# Patient Record
Sex: Female | Born: 1975 | Race: White | Hispanic: No | Marital: Married | Smoking: Former smoker
Health system: Southern US, Community
[De-identification: ages and names within clinical notes are randomized; demographics above are authoritative.]

## PROBLEM LIST (undated history)

## (undated) DIAGNOSIS — M5020 Other cervical disc displacement, unspecified cervical region: Secondary | ICD-10-CM

## (undated) DIAGNOSIS — N938 Other specified abnormal uterine and vaginal bleeding: Secondary | ICD-10-CM

## (undated) DIAGNOSIS — R112 Nausea with vomiting, unspecified: Secondary | ICD-10-CM

## (undated) DIAGNOSIS — D649 Anemia, unspecified: Secondary | ICD-10-CM

## (undated) DIAGNOSIS — F32A Depression, unspecified: Secondary | ICD-10-CM

## (undated) DIAGNOSIS — Z9889 Other specified postprocedural states: Secondary | ICD-10-CM

## (undated) DIAGNOSIS — F329 Major depressive disorder, single episode, unspecified: Secondary | ICD-10-CM

## (undated) DIAGNOSIS — K5900 Constipation, unspecified: Secondary | ICD-10-CM

## (undated) DIAGNOSIS — R1013 Epigastric pain: Secondary | ICD-10-CM

## (undated) DIAGNOSIS — N840 Polyp of corpus uteri: Secondary | ICD-10-CM

## (undated) DIAGNOSIS — N390 Urinary tract infection, site not specified: Secondary | ICD-10-CM

## (undated) DIAGNOSIS — F419 Anxiety disorder, unspecified: Secondary | ICD-10-CM

## (undated) DIAGNOSIS — N63 Unspecified lump in unspecified breast: Secondary | ICD-10-CM

## (undated) DIAGNOSIS — N644 Mastodynia: Secondary | ICD-10-CM

## (undated) DIAGNOSIS — M5030 Other cervical disc degeneration, unspecified cervical region: Secondary | ICD-10-CM

## (undated) DIAGNOSIS — N920 Excessive and frequent menstruation with regular cycle: Secondary | ICD-10-CM

## (undated) HISTORY — DX: Other cervical disc displacement, unspecified cervical region: M50.20

## (undated) HISTORY — DX: Epigastric pain: R10.13

## (undated) HISTORY — DX: Polyp of corpus uteri: N84.0

## (undated) HISTORY — DX: Anemia, unspecified: D64.9

## (undated) HISTORY — DX: Excessive and frequent menstruation with regular cycle: N92.0

## (undated) HISTORY — DX: Depression, unspecified: F32.A

## (undated) HISTORY — DX: Constipation, unspecified: K59.00

## (undated) HISTORY — DX: Anxiety disorder, unspecified: F41.9

## (undated) HISTORY — PX: DILATION AND CURETTAGE OF UTERUS: SHX78

## (undated) HISTORY — DX: Major depressive disorder, single episode, unspecified: F32.9

## (undated) HISTORY — DX: Unspecified lump in unspecified breast: N63.0

## (undated) HISTORY — DX: Other cervical disc degeneration, unspecified cervical region: M50.30

## (undated) HISTORY — DX: Urinary tract infection, site not specified: N39.0

## (undated) HISTORY — DX: Other specified abnormal uterine and vaginal bleeding: N93.8

## (undated) HISTORY — PX: HYSTEROSCOPY: SHX211

## (undated) HISTORY — DX: Mastodynia: N64.4

---

## 1996-04-19 HISTORY — PX: TONSILLECTOMY: SUR1361

## 2002-04-19 HISTORY — PX: DIAGNOSTIC LAPAROSCOPY: SUR761

## 2004-04-07 ENCOUNTER — Ambulatory Visit: Payer: Self-pay | Admitting: Unknown Physician Specialty

## 2004-04-23 ENCOUNTER — Ambulatory Visit: Payer: Self-pay | Admitting: Unknown Physician Specialty

## 2006-12-26 ENCOUNTER — Encounter: Payer: Self-pay | Admitting: Maternal and Fetal Medicine

## 2006-12-28 ENCOUNTER — Inpatient Hospital Stay: Payer: Self-pay

## 2007-05-15 ENCOUNTER — Observation Stay: Payer: Self-pay

## 2007-05-16 ENCOUNTER — Observation Stay: Payer: Self-pay | Admitting: Obstetrics and Gynecology

## 2007-06-28 ENCOUNTER — Inpatient Hospital Stay: Payer: Self-pay | Admitting: Obstetrics and Gynecology

## 2011-02-16 ENCOUNTER — Ambulatory Visit: Payer: Self-pay | Admitting: Physician Assistant

## 2011-02-22 ENCOUNTER — Emergency Department: Payer: Self-pay

## 2011-02-22 ENCOUNTER — Emergency Department: Payer: Self-pay | Admitting: Emergency Medicine

## 2012-05-20 DIAGNOSIS — N840 Polyp of corpus uteri: Secondary | ICD-10-CM

## 2012-05-20 HISTORY — DX: Polyp of corpus uteri: N84.0

## 2012-05-24 ENCOUNTER — Ambulatory Visit: Payer: Self-pay | Admitting: Obstetrics and Gynecology

## 2012-05-24 LAB — CBC
HCT: 36.8 % (ref 35.0–47.0)
HGB: 12.5 g/dL (ref 12.0–16.0)
MCHC: 34 g/dL (ref 32.0–36.0)
MCV: 94 fL (ref 80–100)
Platelet: 211 10*3/uL (ref 150–440)
RBC: 3.9 10*6/uL (ref 3.80–5.20)
WBC: 5.3 10*3/uL (ref 3.6–11.0)

## 2012-05-26 ENCOUNTER — Ambulatory Visit: Payer: Self-pay | Admitting: Obstetrics and Gynecology

## 2012-05-29 LAB — PATHOLOGY REPORT

## 2013-03-28 ENCOUNTER — Ambulatory Visit: Payer: Self-pay | Admitting: General Practice

## 2013-04-19 DIAGNOSIS — Z862 Personal history of diseases of the blood and blood-forming organs and certain disorders involving the immune mechanism: Secondary | ICD-10-CM | POA: Insufficient documentation

## 2013-05-15 ENCOUNTER — Ambulatory Visit: Payer: Self-pay | Admitting: Gastroenterology

## 2013-05-15 LAB — CBC WITH DIFFERENTIAL/PLATELET
Basophil #: 0 10*3/uL (ref 0.0–0.1)
Basophil %: 0.7 %
Eosinophil #: 0.1 10*3/uL (ref 0.0–0.7)
Eosinophil %: 1.7 %
HCT: 36.4 % (ref 35.0–47.0)
HGB: 11.8 g/dL — AB (ref 12.0–16.0)
LYMPHS PCT: 33.8 %
Lymphocyte #: 1.6 10*3/uL (ref 1.0–3.6)
MCH: 29 pg (ref 26.0–34.0)
MCHC: 32.4 g/dL (ref 32.0–36.0)
MCV: 90 fL (ref 80–100)
MONO ABS: 0.3 x10 3/mm (ref 0.2–0.9)
Monocyte %: 7.2 %
NEUTROS ABS: 2.6 10*3/uL (ref 1.4–6.5)
NEUTROS PCT: 56.6 %
Platelet: 236 10*3/uL (ref 150–440)
RBC: 4.07 10*6/uL (ref 3.80–5.20)
RDW: 14.7 % — ABNORMAL HIGH (ref 11.5–14.5)
WBC: 4.6 10*3/uL (ref 3.6–11.0)

## 2013-05-15 LAB — FERRITIN: FERRITIN (ARMC): 5 ng/mL — AB (ref 8–388)

## 2013-05-16 LAB — IRON AND TIBC
IRON SATURATION: 8 %
Iron Bind.Cap.(Total): 478 ug/dL — ABNORMAL HIGH (ref 250–450)
Iron: 36 ug/dL — ABNORMAL LOW (ref 50–170)
UNBOUND IRON-BIND. CAP.: 442 ug/dL

## 2013-06-07 LAB — HM PAP SMEAR: HM PAP: NEGATIVE

## 2014-02-27 ENCOUNTER — Ambulatory Visit (INDEPENDENT_AMBULATORY_CARE_PROVIDER_SITE_OTHER): Payer: 59 | Admitting: General Surgery

## 2014-02-27 ENCOUNTER — Encounter: Payer: Self-pay | Admitting: General Surgery

## 2014-02-27 VITALS — BP 122/80 | HR 76 | Resp 12 | Ht 66.5 in | Wt 143.0 lb

## 2014-02-27 DIAGNOSIS — R1011 Right upper quadrant pain: Secondary | ICD-10-CM

## 2014-02-27 NOTE — Progress Notes (Signed)
Patient ID: Stephanie Chambers, female   DOB: 01-13-1976, 38 y.o.   MRN: [id number]  Chief Complaint  Patient presents with  . Abdominal Pain    HPI Stephanie Chambers is a 38 y.o. female.  here today for evaluation right upper abdomen pain described as a "discomfort. She describes it as a "bulge" and " fullness" feeling. She has noticed this since Nov last year. It does seem to come and go. Ultrasound was done 6-8 months ago that was negative. No trouble chewing or swallowing her food. Not associated with any particular foods. Bowels are regular 3 x a week. Denies nausea, vomiting or reflux. She has seen Dr Rayann Heman and he felt like it was IBS.  The symptoms originally started as severe diarrhea last year.  HPI  History reviewed. No pertinent past medical history.  Past Surgical History  Procedure Laterality Date  . Tonsillectomy  1998  . Diagnostic laparoscopy  2004  . Hysteroscopy      Family History  Problem Relation Age of Onset  . Cancer Mother 44    breast with liver mets  . Cancer Father 72    colon, lung, kidney    Social History History  Substance Use Topics  . Smoking status: Never Smoker   . Smokeless tobacco: Never Used  . Alcohol Use: 0.0 oz/week    0 Not specified per week    Allergies  Allergen Reactions  . Other Shortness Of Breath    omnicef    Current Outpatient Prescriptions  Medication Sig Dispense Refill  . buPROPion (WELLBUTRIN SR) 150 MG 12 hr tablet Take 150 mg by mouth daily.   6   No current facility-administered medications for this visit.    Review of Systems Review of Systems  Constitutional: Negative.   Respiratory: Negative.   Cardiovascular: Negative.   Gastrointestinal: Positive for abdominal pain. Negative for nausea, vomiting, diarrhea and constipation.    Blood pressure 122/80, pulse 76, resp. rate 12, height 5' 6.5" (1.689 m), weight 143 lb (64.864 kg), last menstrual period 02/03/2014.  Physical Exam Physical Exam   Constitutional: She is oriented to person, place, and time. She appears well-developed and well-nourished.  Eyes: Conjunctivae are normal. No scleral icterus.  Neck: Neck supple.  Cardiovascular: Normal rate, regular rhythm and normal heart sounds.   Pulmonary/Chest: Effort normal and breath sounds normal.  Abdominal: Soft. Normal appearance and bowel sounds are normal. There is no hepatosplenomegaly. There is no tenderness.  Lymphadenopathy:    She has no cervical adenopathy.  Neurological: She is alert and oriented to person, place, and time.  Skin: Skin is warm and dry.    Data Reviewed Ultrasound reviewed. No biliary abnormality Assessment    RUQ abd discomfort, episodic , no relation to eating or type of food. Possible IBS. FH of colon breast , kidney cancers.    Plan    CT scan abdomen.    Patient has been scheduled for a CT abdomen/pelvis with contrast at [facility] for 03-06-14 at 9 am (arrive 8:45 am). Prep: no solids 4 hours prior but patient may have clear liquids up until exam time, pick up prep kit, and take medication list. Patient verbalizes understanding.   PCP:  Ashok Cordia Ref Dr. Dennison Nancy 02/27/2014, 1:41 PM

## 2014-02-27 NOTE — Patient Instructions (Addendum)
The patient is aware to call back for any questions or concerns.  Patient has been scheduled for a CT abdomen/pelvis with contrast at [facility] for 03-06-14 at 9 am (arrive 8:45 am). Prep: no solids 4 hours prior but patient may have clear liquids up until exam time, pick up prep kit, and take medication list. Patient verbalizes understanding.

## 2014-03-06 ENCOUNTER — Ambulatory Visit: Payer: Self-pay | Admitting: General Surgery

## 2014-03-06 ENCOUNTER — Encounter: Payer: Self-pay | Admitting: General Surgery

## 2014-03-06 LAB — HCG, QUANTITATIVE, PREGNANCY

## 2014-03-07 ENCOUNTER — Encounter: Payer: Self-pay | Admitting: General Surgery

## 2014-06-13 ENCOUNTER — Ambulatory Visit: Payer: Self-pay | Admitting: Physician Assistant

## 2014-07-17 ENCOUNTER — Ambulatory Visit
Admit: 2014-07-17 | Disposition: A | Discharge status: Home / Self Care | Payer: Self-pay | Attending: Obstetrics and Gynecology | Admitting: Obstetrics and Gynecology

## 2014-07-17 LAB — HM MAMMOGRAPHY

## 2014-07-18 ENCOUNTER — Ambulatory Visit
Admit: 2014-07-18 | Disposition: A | Discharge status: Home / Self Care | Payer: Self-pay | Admitting: Obstetrics and Gynecology

## 2014-08-09 NOTE — Op Note (Signed)
PATIENT NAME:  Stephanie Chambers, Stephanie Chambers MR#:  [id number] DATE OF BIRTH:  04-Mar-1976  DATE OF PROCEDURE:  05/26/2012  PREOPERATIVE DIAGNOSES:  1.  Abnormal uterine bleeding.  2.  Suspected endometrial polyps.   POSTOPERATIVE DIAGNOSES: 1.  Abnormal uterine bleeding.  2.  Endometrial polyps.   OPERATIVE PROCEDURES: 1.  Hysteroscopy.  2.  Dilation and curettage of the endometrium.   SURGEON:  Alanda Slim. Juliona Vales, M.D.   FIRST ASSISTANT:  Stephanie Hook, PA student, Tyler Deis.  ANESTHESIA:  General LMA.   INDICATIONS:  The patient is a 39 year old married white female, para 1-0-0-1, with a long history of abnormal uterine bleeding; preoperative ultrasound demonstrated endometrial polyps.   FINDINGS AT SURGERY:  Revealed a gynecoid pelvis. There was uterine descensus to within 2 cm of the introitus. Hysteroscopy demonstrated several endometrial polyps within the endometrial cavity, which was otherwise normal. Bilateral ostia were seen.   DESCRIPTION OF PROCEDURE:  The patient was brought to the operating room where she was placed in the supine position. General anesthesia with LMA was induced without difficulty. She was placed in the dorsal lithotomy position using the candy cane stirrups. An exam under anesthesia demonstrated the above findings on pelvic exam. After normal prep and drape with Betadine, a weighted speculum was placed into the vagina. A single-tooth tenaculum was placed on the edge of the cervix. The uterus was sounded to 8 cm. Hanks dilators were used up to a #20-French caliber to dilate the endocervical canal. The [facility] hysteroscope with lactated Ringer's as irrigant was used to identify the intrauterine findings. Photodocumentation was taken. Stone polyp forceps was then used to extract polyps. This was followed by curettage of the endometrium with both smooth and serrated curettes. Repeat hysteroscopy was performed. Good sampling was obtained. Minimal tissue was left behind. The procedure  was then terminated with all instrumentation being removed from the vagina. The patient was then awakened, mobilized, and taken to the recovery room in satisfactory condition.   ESTIMATED BLOOD LOSS: Minimal.   COMPLICATIONS:  None.   COUNTS:  All instruments, needle and sponge counts were verified as correct.       ____________________________ Alanda Slim. Inas Avena, MD mad:dm D: 05/26/2012 08:24:25 ET T: 05/26/2012 11:03:05 ET JOB#: [id number]  cc: Hassell Done A. Marenda Accardi, MD, <Dictator> [facility] MD ELECTRONICALLY SIGNED 05/28/2012 10:47

## 2014-08-09 NOTE — H&P (Signed)
PATIENT NAME:  Chambers, Stephanie MR#:  [id number] DATE OF BIRTH:  12/27/1975  DATE OF ADMISSION:  05/26/2012  PREOPERATIVE DIAGNOSES:   1.  Abnormal uterine bleeding.  2.  Suspected endocervical polyp.   HISTORY OF PRESENT ILLNESS:  The patient is a 39 year old married white female, para 1-0-0-1, with history of abnormal uterine bleeding associated with exercise who presents for surgical management of this condition. Preoperative ultrasound on 02/09/2012 demonstrated findings consistent with probable endometrial polyp measuring 1.35 x 0.85 cm. The uterus was normal in contour and echotexture. The ovaries were seen and normal at that time. In light of the ongoing symptoms, the patient desires definitive evaluation and management.   PAST MEDICAL HISTORY:   1.  Depression.  2.  Fibrocystic change of the breasts.  3.  History of UTI.   PAST SURGICAL HISTORY:  Diagnostic laparoscopy in 1998.   PAST OB HISTORY:  Para 1-0-0-1.   FAMILY HISTORY:  Notable for hypertension and heart disease in father and paternal grandfather, colon cancer in the maternal grandmother, diabetes mellitus in grandmothers, breast cancer in mother and cousin.   SOCIAL HISTORY:  The patient does not smoke; she is a former smoker. Alcohol use is occasional. Drug use none.   CURRENT MEDICATIONS:  Wellbutrin SR 150 mg daily.   DRUG ALLERGIES:  ALMONDS AND PECANS; no known drug allergies.   REVIEW OF SYSTEMS:  The patient denies recent illness. She denies history of coagulopathy. She denies reactive airway disease.   PHYSICAL EXAMINATION: VITAL SIGNS: Height 66 inches, weight 140 pounds, blood pressure 125/72.  GENERAL: The patient is a pleasant well-appearing white female with normal affect. She is alert and oriented.  OROPHARYNX: Clear.  NECK: Supple without thyromegaly or adenopathy.  LUNGS: Clear.  HEART: Regular rate and rhythm without murmur.  ABDOMEN: Soft and nontender. No organomegaly.  PELVIC: Deferred to  the OR.  EXTREMITIES: Warm and dry. No edema.  MUSCULOSKELETAL: Normal.  NEUROLOGIC: Normal.  SKIN: Without rash.   IMPRESSION:   1.  Abnormal uterine bleeding.  2.  Endometrial polyp.   PLAN:  Hysteroscopy with dilation and curettage of the endometrium. Date of surgery is 05/26/2012.   COUNSELING NOTE:  The patient is understanding of the planned procedure and is aware of and is accepting of all surgical risks which include, but are not limited to bleeding, infection, pelvic organ injury with need for repair, uterine perforation, anesthesia risks and death. All questions are answered. Informed consent is given. The patient is ready and willing to proceed with surgery as scheduled.     ____________________________ Alanda Slim Sydelle Sherfield, MD mad:si D: 05/25/2012 20:52:05 ET T: 05/25/2012 22:04:44 ET JOB#: [id number]  cc: Hassell Done A. Lelon Ikard, MD, <Dictator> [facility] MD ELECTRONICALLY SIGNED 05/28/2012 10:47

## 2014-12-03 ENCOUNTER — Ambulatory Visit: Payer: Self-pay | Admitting: Obstetrics and Gynecology

## 2015-03-19 ENCOUNTER — Encounter: Payer: Self-pay | Admitting: Physician Assistant

## 2015-03-19 ENCOUNTER — Ambulatory Visit: Payer: Self-pay | Admitting: Physician Assistant

## 2015-03-19 VITALS — BP 108/80 | HR 72 | Temp 98.5°F

## 2015-03-19 DIAGNOSIS — J018 Other acute sinusitis: Secondary | ICD-10-CM

## 2015-03-19 MED ORDER — AZITHROMYCIN 250 MG PO TABS
ORAL_TABLET | ORAL | Status: DC
Start: 2015-03-19 — End: 2015-03-25

## 2015-03-19 MED ORDER — FLUTICASONE PROPIONATE 50 MCG/ACT NA SUSP: 2.0000 | Freq: Every day | NASAL | Status: DC

## 2015-03-19 NOTE — Progress Notes (Signed)
S: C/o runny nose and congestion for 7 days, no fever, chills, cp/sob, v/d; mucus is green and thick, cough is sporadic, c/o of facial and dental pain.   Using otc meds:   O: PE: vitals wnl, perrl eomi, normocephalic, tms dull, nasal mucosa red and swollen, throat injected, neck supple no lymph, lungs c t a, cv rrr, neuro intact  A:  Acute sinusitis   P: drink fluids, continue regular meds , use otc meds of choice, return if not improving in 5 days, return earlier if worsening . Zpack, flonase

## 2015-03-25 ENCOUNTER — Ambulatory Visit: Payer: Self-pay | Admitting: Physician Assistant

## 2015-03-25 ENCOUNTER — Encounter: Payer: Self-pay | Admitting: Physician Assistant

## 2015-03-25 VITALS — BP 100/76 | Temp 98.2°F

## 2015-03-25 DIAGNOSIS — J018 Other acute sinusitis: Secondary | ICD-10-CM

## 2015-03-25 DIAGNOSIS — H9202 Otalgia, left ear: Secondary | ICD-10-CM

## 2015-03-25 MED ORDER — AMOXICILLIN 875 MG PO TABS: 875.0000 mg | ORAL_TABLET | Freq: Two times a day (BID) | ORAL | Status: DC

## 2015-03-25 MED ORDER — PREDNISONE 10 MG PO TABS: 30.0000 mg | ORAL_TABLET | Freq: Every day | ORAL | Status: DC

## 2015-03-25 MED ORDER — FLUCONAZOLE 150 MG PO TABS: 150.0000 mg | ORAL_TABLET | Freq: Once | ORAL | Status: DC

## 2015-03-25 NOTE — Progress Notes (Signed)
S:  C/o ears popping and being stopped up,increased pain in left ear, no drainage from ears, no fever/chills, no cough or congestion, some sinus pressure, remainder ros neg, started dose pack after being seen last week, it gave her diarrhea and stomach pain after first loading dose so didn't take anymore until last night, no problem with it since last night Using otc meds without relief  O:  Vitals wnl, nad, tms dull b/l, nasal mucosa swollen, throat wnl, neck supple no lymph, lungs c t a, cv rrr, neuro intact  A: acute eustachean tube dysfunction, acute sinusitis, acute left ear pain  P: flonase, sudafed, prednisone 30mg  qd x 3d, amoxil 875mg  bid x 10d, go to ENT if ear pain worsening; return if not improving in 3 to 5 days, return earlier if worsening

## 2015-05-07 ENCOUNTER — Other Ambulatory Visit: Payer: Self-pay | Admitting: Obstetrics and Gynecology

## 2015-06-06 DIAGNOSIS — H52223 Regular astigmatism, bilateral: Secondary | ICD-10-CM | POA: Diagnosis not present

## 2015-07-14 ENCOUNTER — Encounter: Payer: Self-pay | Admitting: Physician Assistant

## 2015-07-14 ENCOUNTER — Ambulatory Visit: Payer: Self-pay | Admitting: Physician Assistant

## 2015-07-14 VITALS — BP 90/70 | HR 60 | Temp 99.0°F

## 2015-07-14 DIAGNOSIS — J069 Acute upper respiratory infection, unspecified: Secondary | ICD-10-CM

## 2015-07-14 DIAGNOSIS — R509 Fever, unspecified: Secondary | ICD-10-CM

## 2015-07-14 LAB — POCT INFLUENZA A/B
INFLUENZA B, POC: NEGATIVE
Influenza A, POC: NEGATIVE

## 2015-07-14 MED ORDER — AMOXICILLIN 875 MG PO TABS: 875.0000 mg | ORAL_TABLET | Freq: Two times a day (BID) | ORAL | Status: DC

## 2015-07-14 NOTE — Progress Notes (Signed)
S: C/o runny nose and congestion with dry cough for 3 weeks, + fever, chills since last night, denies cp/sob, v/d; mucus was green this am but clear throughout the day, cough is sporadic,   Using otc meds: robitussin  O: PE: vitals w low grade temp, nad,  perrl eomi, normocephalic, tms dull, nasal mucosa red and swollen, throat injected, neck supple no lymph, lungs c t a, cv rrr, neuro intact, flu swab neg  A:  Acute sinusitis, uri   P: amoxil 875mg  bid x 10d, drink fluids, continue regular meds , use otc meds of choice, return if not improving in 5 days, return earlier if worsening

## 2015-08-29 ENCOUNTER — Telehealth: Payer: Self-pay | Admitting: Obstetrics and Gynecology

## 2015-08-29 MED ORDER — BUPROPION HCL ER (SR) 150 MG PO TB12: 150.0000 mg | ORAL_TABLET | Freq: Two times a day (BID) | ORAL | Status: DC

## 2015-08-29 NOTE — Telephone Encounter (Signed)
Pt aware med erx. 

## 2015-08-29 NOTE — Telephone Encounter (Signed)
Wellbutrin refill ([place])  Pt is scheduled for AE on 6/13 and she will run out of wellbutrin before that visit

## 2015-09-30 ENCOUNTER — Encounter: Payer: Self-pay | Admitting: Obstetrics and Gynecology

## 2015-10-08 ENCOUNTER — Ambulatory Visit: Payer: Self-pay | Admitting: Physician Assistant

## 2015-10-10 ENCOUNTER — Ambulatory Visit
Admission: RE | Admit: 2015-10-10 | Discharge: 2015-10-10 | Disposition: A | Discharge status: Home / Self Care | Payer: 59 | Source: Ambulatory Visit | Attending: Physician Assistant | Admitting: Physician Assistant

## 2015-10-10 ENCOUNTER — Encounter: Payer: Self-pay | Admitting: Physician Assistant

## 2015-10-10 ENCOUNTER — Ambulatory Visit: Payer: Self-pay | Admitting: Physician Assistant

## 2015-10-10 VITALS — BP 120/80 | HR 80 | Temp 98.4°F

## 2015-10-10 DIAGNOSIS — M542 Cervicalgia: Secondary | ICD-10-CM | POA: Diagnosis not present

## 2015-10-10 DIAGNOSIS — M25511 Pain in right shoulder: Secondary | ICD-10-CM

## 2015-10-10 NOTE — Progress Notes (Signed)
   Subjective:neck and right shoulder pain    Patient ID: Stephanie Chambers, female    DOB: 1976-03-26, 40 y.o.   MRN: [id number]  HPI Patient c/o 3 years of neck and right shoulder pain. Follow by Chiropractor for same length of time. Sessions only provided transit relief. States intermitting by increasing numbness to upper extremities.  No other palliative measures. States no imaging perform for this chronic compliant.  Review of Systems Negative except for compliant.    Objective:   Physical Exam No spinal or shoulder deformity. Spinal process non-tender to palpation. Patine has moderate guarding at [facility] joint. Full and equal ROM of neck and shoulder today.       Assessment & Plan:Radicular neck pain to right upper extremity.  Continue supportive care pending imaging of C-Spine and Right Shoulder.  Follow s/p x-ray.

## 2015-10-14 ENCOUNTER — Ambulatory Visit (INDEPENDENT_AMBULATORY_CARE_PROVIDER_SITE_OTHER): Payer: 59 | Admitting: Obstetrics and Gynecology

## 2015-10-14 ENCOUNTER — Encounter: Payer: Self-pay | Admitting: Obstetrics and Gynecology

## 2015-10-14 VITALS — BP 97/63 | HR 77 | Ht 66.5 in | Wt 145.6 lb

## 2015-10-14 DIAGNOSIS — Z0000 Encounter for general adult medical examination without abnormal findings: Secondary | ICD-10-CM

## 2015-10-14 DIAGNOSIS — Z1239 Encounter for other screening for malignant neoplasm of breast: Secondary | ICD-10-CM | POA: Diagnosis not present

## 2015-10-14 DIAGNOSIS — N943 Premenstrual tension syndrome: Secondary | ICD-10-CM | POA: Diagnosis not present

## 2015-10-14 DIAGNOSIS — R51 Headache: Secondary | ICD-10-CM | POA: Diagnosis not present

## 2015-10-14 DIAGNOSIS — R519 Headache, unspecified: Secondary | ICD-10-CM

## 2015-10-14 DIAGNOSIS — Z01419 Encounter for gynecological examination (general) (routine) without abnormal findings: Secondary | ICD-10-CM

## 2015-10-14 MED ORDER — BUPROPION HCL ER (SR) 150 MG PO TB12: 150.0000 mg | ORAL_TABLET | Freq: Two times a day (BID) | ORAL | Status: DC

## 2015-10-14 NOTE — Patient Instructions (Signed)
1. No Pap smear is needed until next year 2. Mammogram ordered 3. Continue with healthy eating and exercise 4. Refill Wellbutrin 5. Return in 1 year or as needed

## 2015-10-14 NOTE — Progress Notes (Signed)
Patient ID: Stephanie Chambers, female   DOB: [birth year], 40 y.o.   MRN: [id number] ANNUAL PREVENTATIVE CARE GYN  ENCOUNTER NOTE  Subjective:       Stephanie Chambers is a 40 y.o. G1P1 female here for a routine annual gynecologic exam.  Current complaints: 1.  Pms-1-2 weeks before cycle - h/a blurred vision fatigue breast tenderness   Headaches responded to [place]. Bowel and bladder function are normal. Father and maternal grandmother had colon cancer, with father's diagnosis being made at age 24   Gynecologic History Patient's last menstrual period was 09/15/2015. Contraception: none Last Pap: 05/2013 neg/neg . Results were: normal Last mammogram: 07/17/2014. Results were: normal  Obstetric History OB History  Gravida Para Term Preterm AB SAB TAB Ectopic Multiple Living  1 1            # Outcome Date GA Lbr Len/2nd Weight Sex Delivery Anes PTL Lv  1 Para             Obstetric Comments  1st Menstrual Cycle:  11  1st Pregnancy:  31    Past Medical History  Diagnosis Date  . Lump in female breast   . UTI (lower urinary tract infection)   . Depression     major  . Menorrhagia   . Epigastric abdominal pain   . Anemia   . Family history of breast cancer in mother   . Mastodynia   . Anxiety   . DUB (dysfunctional uterine bleeding)   . Constipation   . Endometrial polyp 05/2012    hysteroscopy w/ d &C    Past Surgical History  Procedure Laterality Date  . Tonsillectomy  1998  . Diagnostic laparoscopy  2004  . Hysteroscopy    . Dilation and curettage of uterus      Current Outpatient Prescriptions on File Prior to Visit  Medication Sig Dispense Refill  . buPROPion (WELLBUTRIN SR) 150 MG 12 hr tablet Take 1 tablet (150 mg total) by mouth 2 (two) times daily. 60 tablet 2  . fluticasone (FLONASE) 50 MCG/ACT nasal spray Place 2 sprays into both nostrils daily. 16 g 6   No current facility-administered medications on file prior to visit.    Allergies  Allergen Reactions   . Other Shortness Of Breath    omnicef    Social History   Social History  . Marital Status: Married    Spouse Name: N/A  . Number of Children: N/A  . Years of Education: N/A   Occupational History  . Not on file.   Social History Main Topics  . Smoking status: Former Research scientist (life sciences)  . Smokeless tobacco: Never Used  . Alcohol Use: 0.0 oz/week    0 Standard drinks or equivalent per week     Comment: occas  . Drug Use: No  . Sexual Activity: Yes    Birth Control/ Protection: None   Other Topics Concern  . Not on file   Social History Narrative    Family History  Problem Relation Age of Onset  . Cancer Mother 24    breast with liver mets  . Cancer Father 50    colon, lung, kidney  . Heart disease Father   . Depression Maternal Grandmother   . Cancer Maternal Grandmother     colon  . Depression Paternal Grandmother   . Heart disease Paternal Grandfather     The following portions of the patient's history were reviewed and updated as appropriate: allergies, current medications, past family history,  past medical history, past social history, past surgical history and problem list.  Review of Systems ROS Review of Systems - General ROS: negative for - chills, fatigue, fever, hot flashes, night sweats, weight gain or weight loss Psychological ROS: negative for - anxiety, decreased libido, depression, mood swings, physical abuse or sexual abuse Ophthalmic ROS: negative for - blurry vision, eye pain or loss of vision ENT ROS: negative for - headaches, hearing change, visual changes or vocal changes Allergy and Immunology ROS: negative for - hives, itchy/watery eyes or seasonal allergies Hematological and Lymphatic ROS: negative for - bleeding problems, bruising, swollen lymph nodes or weight loss Endocrine ROS: negative for - galactorrhea, hair pattern changes, hot flashes, malaise/lethargy, mood swings, palpitations, polydipsia/polyuria, skin changes, temperature intolerance  or unexpected weight changes Breast ROS: negative for - new or changing breast lumps or nipple discharge Respiratory ROS: negative for - cough or shortness of breath Cardiovascular ROS: negative for - chest pain, irregular heartbeat, palpitations or shortness of breath Gastrointestinal ROS: no abdominal pain, change in bowel habits, or black or bloody stools Genito-Urinary ROS: no dysuria, trouble voiding, or hematuria Musculoskeletal ROS: negative for - joint pain or joint stiffness Neurological ROS: negative for - bowel and bladder control changes Dermatological ROS: negative for rash and skin lesion changes   Objective:   BP 97/63 mmHg  Pulse 77  Ht 5' 6.5" (1.689 m)  Wt 145 lb 9.6 oz (66.044 kg)  BMI 23.15 kg/m2  LMP 09/15/2015 CONSTITUTIONAL: Well-developed, well-nourished female in no acute distress.  PSYCHIATRIC: Normal mood and affect. Normal behavior. Normal judgment and thought content. [place]: Alert and oriented to person, place, and time. Normal muscle tone coordination. No cranial nerve deficit noted. HENT:  Normocephalic, atraumatic, External right and left ear normal. Oropharynx is clear and moist EYES: Conjunctivae and EOM are normal.  No scleral icterus.  NECK: Normal range of motion, supple, no masses.  Normal thyroid.  SKIN: Skin is warm and dry. No rash noted. Not diaphoretic. No erythema. No pallor. CARDIOVASCULAR: Normal heart rate noted, regular rhythm, no murmur. RESPIRATORY: Clear to auscultation bilaterally. Effort and breath sounds normal, no problems with respiration noted. BREASTS: Symmetric in size. No masses, skin changes, nipple drainage, or lymphadenopathy. ABDOMEN: Soft, normal bowel sounds, no distention noted.  No tenderness, rebound or guarding.  BLADDER: Normal PELVIC:  External Genitalia: Normal  BUS: Normal  Vagina: Normal  Cervix: Normal; No lesions  Uterus: Normal; midplane, normal size and shape, mobile  Adnexa: Normal  RV: External  Exam NormaI, No Rectal Masses and Normal Sphincter tone  MUSCULOSKELETAL: Normal range of motion. No tenderness.  No cyanosis, clubbing, or edema.  2+ distal pulses. LYMPHATIC: No Axillary, Supraclavicular, or Inguinal Adenopathy.    Assessment:   Annual gynecologic examination 40 y.o. Contraception: none Normal BMI PMS  Plan:  Pap: Not needed Mammogram: Ordered Stool Guaiac Testing:  Not Indicated Labs: decline Routine preventative health maintenance measures emphasized: Exercise/Diet/Weight control, Tobacco Warnings, Alcohol/Substance use risks, Stress Management and Safe Sex Refill Wellbutrin Return to [place], CMA  Brayton Mars, MD  Note: This dictation was prepared with Dragon dictation along with smaller phrase technology. Any transcriptional errors that result from this process are unintentional.

## 2015-11-11 ENCOUNTER — Ambulatory Visit: Payer: 59 | Attending: Physician Assistant | Admitting: Physical Therapy

## 2015-11-11 DIAGNOSIS — M542 Cervicalgia: Secondary | ICD-10-CM | POA: Insufficient documentation

## 2015-11-11 NOTE — Therapy (Signed)
[place] MAIN [facility] SERVICES [address] [place], [place], [address] Phone: [phone]   Fax:  [phone]  Patient Details  Name: Stephanie Chambers MRN: [id number] Date of Birth: 11-03-1975 Referring Provider:  Versie Starks, PA-C  Encounter Date: 11/11/2015 PT/OT/SLP Screening Form   Time: in_08:05__     Time out_08:30__   Complaint __Neck and Shoulder pain_________________ Past Medical Hx:  _None Significant_______________ Injury Date:___Neck has been hurting for last 6-7 years off/on________  Pain Scale: _Current: 2-3/10, Worst: 4/10, Best: 1/10;l Reports constant pain with no relief; Pain is worse in the morning after sleeping; Better after massage or chiropractor_________ Patient's phone number: [phone]  Hx (this occurrence):  Patient reports having upper trap pain for last 6-7 years off/on. She reports having to see a massage therapist or chiropractor every 3-4 months for pain relief. She reports worst pain in the morning after sleeping. She has been unable to sleep on her right side. She reports trying a new pillow (contour memory foam) which has helped a little but not much;  Assessment: Negative spurlings for impingement; negative ULNTTs bilaterally; Patient does exhibit tightness in right upper trap which is worse with left cervical lateral flexion; Slight pain with right lateral flexion; Demonstrates mild forward rounded shoulders in unsupported sitting; Has moderate tenderness to right upper trap; Some tightness noted as compared to left;  BUE gross strength is WNL;   Recommendations:   Patient would benefit from skilled PT intervention for education in cervical stretches, posture/body mechanics and manual therapy to reduce discomfort; Educated patient in scapular retraction with cues to avoid shoulder elevation and upper trap stretch 10 sec hold x2-3;  Comments:    [x]  Patient would benefit from an MD referral []   Patient would benefit from a full PT/OT/ SLP evaluation and treatment. []  No intervention recommended at this time.  Thank you for this referral.   Trotter,Margaret  PT, DPT 11/11/2015, 8:29 AM  [place] MAIN [facility] SERVICES [place], [place], [address] Phone: [phone]   Fax:  [phone]

## 2015-11-12 NOTE — Progress Notes (Signed)
PT did free screening and suggests regular PT, approved and order sent due to chronic neck/shoulder pain

## 2015-11-12 NOTE — Addendum Note (Signed)
Addended by: Versie Starks on: 11/12/2015 11:01 AM   Modules accepted: Orders

## 2015-12-03 ENCOUNTER — Ambulatory Visit: Payer: 59 | Attending: Physician Assistant

## 2015-12-03 DIAGNOSIS — M542 Cervicalgia: Secondary | ICD-10-CM | POA: Diagnosis not present

## 2015-12-03 NOTE — Patient Instructions (Signed)
Neck: Retraction    Sit with back and head straight. Pull chin back to line up ear with shoulder. Do not turn or tilt head. May provide overpressure with hand on chin if needed. Hold _3-5___ seconds. Repeat _10___ times. Do ____ sessions per day. While at work try to perform every 45 minutes.  CAUTION: Movement should be gentle, steady and slow.  [place] Stretch, Sitting    Sit, right hand tucked under R hip, L hand over top of head. Gently retract your chin and then flex your left ear to the left shoulder. Use your right hand to pull head to side. Hold _30__ seconds. For more stretch, lean body in direction of head pull. Repeat _3-5__ times per session. Do _2__ sessions per day.   (Clinic) Extension: Thoracic With Lumbar Lock - Sitting    Sit with back against bolster, knees bent, hands locked behind head. Breathe in, extending head and trunk over bolster. Breathe out. Hold position for 3-5 seconds. Repeat _10___ times per set. Do __2__ sets per session. Perform 2 times/day.

## 2015-12-05 NOTE — Therapy (Signed)
[place] MAIN [facility] SERVICES [address] [place], [place], [address] Phone: [phone]   Fax:  [phone]  Physical Therapy Evaluation  Patient Details  Name: Stephanie Chambers MRN: [id number] Date of Birth: [birth year] Referring Provider: Ashok Cordia  Encounter Date: 12/03/2015      PT End of Session - 12/05/15 1332    Visit Number 1   Number of Visits 13   Date for PT Re-Evaluation 01/14/16   PT Start Time 0800   PT Stop Time 0900   PT Time Calculation (min) 60 min   Activity Tolerance Patient tolerated treatment well   Behavior During Therapy [facility] for tasks assessed/performed      Past Medical History:  Diagnosis Date  . Anemia   . Anxiety   . Constipation   . Depression    major  . DUB (dysfunctional uterine bleeding)   . Endometrial polyp 05/2012   hysteroscopy w/ d &C  . Epigastric abdominal pain   . Family history of breast cancer in mother   . Lump in female breast   . Mastodynia   . Menorrhagia   . UTI (lower urinary tract infection)     Past Surgical History:  Procedure Laterality Date  . DIAGNOSTIC LAPAROSCOPY  2004  . DILATION AND CURETTAGE OF UTERUS    . HYSTEROSCOPY    . TONSILLECTOMY  1998    There were no vitals filed for this visit.           [facility] PT Assessment - 12/05/15 1310      Assessment   Medical Diagnosis Cervicalgia   Referring Provider Ashok Cordia   Onset Date/Surgical Date 08/18/15  Approximate   Next MD Visit Not reported   Prior Therapy No physical therapy. History of chiropractic care and massage therapy     Precautions   Precautions None     Restrictions   Weight Bearing Restrictions No     Balance Screen   Has the patient fallen in the past 6 months No     [place] residence   Living Arrangements Spouse/significant other;Children   Available Help at Discharge Family   Type of [place]     Prior Function   Level of  [place] Full time employment   Vocation Requirements Works in administration at [employer]   Overall Cognitive Status Within Functional Limits for tasks assessed     Observation/Other Assessments   Other Surveys  Other Surveys   Neck Disability Index  16%     Sensation   Additional Comments Intact to light touch throughout. Pt reports intermittent RUE numbness tingling. Does not appear to follow a typical dermatomal pattern but pt reports from hand to shoulder.      Posture/Postural Control   Posture Comments Forward head posture with rounded shoulders. Poor sitting posture     ROM / Strength   AROM / PROM / Strength AROM;Strength     AROM   Overall AROM Comments Pain with flexion, extension. Stretch with L lat flexion, pain with R lat flexion.. Negative spurlings R, positive L. Positive Hoffman bilaterally. 2+ BR, BJ, KJ relfexes, Absent AJ bilaterally. Negative babinski bilaterally. No change with repeated protraction or retraction. Normal mobility with 1st rib mobilizations but positive reproduction of symptoms with N/T radiating into RUE. Cervical and thoracic CPA grossly normal in moblity but with positive reproduction of pain and radiating  symptoms T1/T2 central and R UPA mobilizations. ULTT A (median bias) positive reproduction of pain with L lateral neck flexion and decrease with right lateral neck flexion   AROM Assessment Site Cervical   Cervical Flexion 50   Cervical Extension 50   Cervical - Right Side Bend 38   Cervical - Left Side Bend 30   Cervical - Right Rotation 80   Cervical - Left Rotation 80     Strength   Overall Strength Comments Grossly 4+ to 5/5 throughout UE. No focal weakness identified. Grip strength testing defered. LE strength grossly WFL     Palpation   Palpation comment Pt denies tenderness to palpation along posterior neck, upper/mid trap, and rhomboids. No notable tenderness noted     Special Tests     Special Tests Cervical   Cervical Tests Spurling's;Dictraction     Spurling's   Findings Negative   Side Right     Distraction Test   Findngs Positive   side Right   Comment Decrease in symptoms     Ambulation/Gait   Gait Comments Independent with all transfers and mobility          TREATMENT  MANUAL THERAPY  C6-T2 CPA and R UPA, 30 seconds/bout x 2 bouts/level; Pt instructed in self thoracic mobilizations, repeated cervical retractions, and scalene stretch;                 PT Education - 12/05/15 1320    Education provided Yes   Education Details HEP, plan of care   Person(s) Educated Patient   Methods Explanation   Comprehension Verbalized understanding             PT Long Term Goals - 12/05/15 1327      PT LONG TERM GOAL #1   Title Pt will be independent with HEP in order to decrease symptoms and improve pain-free function at home and work   Time 6   Period Weeks   Status New     PT LONG TERM GOAL #2   Title Pt will score less than 10% on NDI in order to demonstrate significant reducation in disability related to neck pain   Baseline 12/03/15: 16%   Time 6   Period Weeks   Status New     PT LONG TERM GOAL #3   Title Pt will report worst neck pain as less than or equal to 3/10 on NPRS in order to demonstrate singificant improvement in neck pain   Baseline 12/03/15: 6/10   Time 6   Period Weeks   Status New               Plan - 12/05/15 1320    Clinical Impression Statement Pt is a pleasant 40 yo female referred for cervicalgia. PT examination reveals R posterior neck pain with N/T in RUE. Sensation to light touch in RUE appears to be intact and symmetrical. Reproduction of pain with active cervical flexion, extension, and R lateral flexion. Pt with positive reproduction of symptoms with upper thoracic central spinal mobility testing as well as R unilateral mobility testing. Negative spurling's test but positive distraction and  positive ULTT with median nerve bias. Pt also with positive reproduction of pain with first rib mobilizations. Pt provided exercises for home program as well as education regarding posture while at work. Pt will benefit from skilled PT services to address deficits in neck pain in order to participate in pain-free activities at home and work.     Rehab  Potential Excellent   Clinical Impairments Affecting Rehab Potential Positive: motivation, prior improvement; Negative: chronicity   PT Frequency 2x / week   PT Duration 6 weeks   PT Treatment/Interventions ADLs/Self Care Home Management;Aquatic Therapy;Cryotherapy;Electrical Stimulation;Iontophoresis 4mg /ml Dexamethasone;Moist Heat;Traction;Ultrasound;Therapeutic exercise;Neuromuscular re-education;Patient/family education;Manual techniques;Passive range of motion;Dry needling   PT Next Visit Plan Central and unilateral lower cervical/upper thoracic mobilizations, first rib mobilizations, median nerve glides, scalene stretch   PT Home Exercise Plan Scalene stretch, repeated retractions, thoracic extension mobility   Consulted and Agree with Plan of Care Patient      Patient will benefit from skilled therapeutic intervention in order to improve the following deficits and impairments:  Pain  Visit Diagnosis: Cervicalgia - Plan: PT plan of care cert/re-cert     Problem List Patient Active Problem List   Diagnosis Date Noted  . Cephalalgia 10/14/2015  . PMS (premenstrual syndrome) 10/14/2015   Phillips Grout PT, DPT   Huprich,Jason 12/05/2015, 1:37 PM  [place] MAIN [facility] SERVICES [address] [place], [place], [address] Phone: [phone]   Fax:  [phone]  Name: Stephanie Chambers MRN: [id number] Date of Birth: [birth year]

## 2015-12-08 ENCOUNTER — Ambulatory Visit: Payer: 59 | Admitting: Physical Therapy

## 2015-12-08 ENCOUNTER — Encounter: Payer: Self-pay | Admitting: Physical Therapy

## 2015-12-08 DIAGNOSIS — M542 Cervicalgia: Secondary | ICD-10-CM
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
## 2015-12-08 NOTE — Patient Instructions (Addendum)
Neurovascular: Median Nerve Glide With Cervical Bias - Supine    Lie with neck supported, right arm out to side, elbow straight, thumb down, fingers and wrist bent back. Slowly move opposite side ear toward shoulder as far as possible without pain. Repeat _10___ times per set. Do ___3_ sets per session. Do _5___ sessions per week.  Copyright  VHI. All rights reserved.

## 2015-12-11 ENCOUNTER — Ambulatory Visit: Payer: 59

## 2015-12-11 DIAGNOSIS — M542 Cervicalgia: Secondary | ICD-10-CM
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
## 2015-12-16 ENCOUNTER — Ambulatory Visit: Payer: 59 | Admitting: Physical Therapy

## 2015-12-18 ENCOUNTER — Encounter: Payer: Self-pay | Admitting: Physical Therapy

## 2015-12-18 ENCOUNTER — Ambulatory Visit: Payer: 59 | Admitting: Physical Therapy

## 2015-12-18 DIAGNOSIS — M542 Cervicalgia: Secondary | ICD-10-CM

## 2015-12-18 NOTE — Therapy (Signed)
[address] MAIN [facility] SERVICES [address] [place], [place], [address] Phone: [phone]   Fax:  [phone]  Physical Therapy Treatment  Patient Details  Name: Stephanie Chambers MRN: [id number] Date of Birth: [birth year] Referring Provider: Ashok Cordia  Encounter Date: 12/18/2015      PT End of Session - 12/18/15 0928    Visit Number 4   Number of Visits 13   Date for PT Re-Evaluation 01/14/16   PT Start Time 0830   PT Stop Time 0920   PT Time Calculation (min) 50 min   Activity Tolerance Patient tolerated treatment well   Behavior During Therapy [facility] for tasks assessed/performed      Past Medical History:  Diagnosis Date  . Anemia   . Anxiety   . Constipation   . Depression    major  . DUB (dysfunctional uterine bleeding)   . Endometrial polyp 05/2012   hysteroscopy w/ d &C  . Epigastric abdominal pain   . Family history of breast cancer in mother   . Lump in female breast   . Mastodynia   . Menorrhagia   . UTI (lower urinary tract infection)     Past Surgical History:  Procedure Laterality Date  . DIAGNOSTIC LAPAROSCOPY  2004  . DILATION AND CURETTAGE OF UTERUS    . HYSTEROSCOPY    . TONSILLECTOMY  1998    There were no vitals filed for this visit.      Subjective Assessment - 12/18/15 0836    Subjective Patient continues to have neck pain and right UE pain if she moves her head. She has tingling and numbness that goes to her right fingers with head movement.       Cervical traction with hold 25 lbs for 20 seconds and then rest force of 20 lbs for 10 seconds  x 20 minutes   Therapeutic exercise:  Repeated cervical extension x 10 Repeated cervical SBR x 10  Repeated cervical lateral rotation x 10   Patient has initial numbness and tingling and pain in her cervical area and then after repeated extensions the pain and the numbness decreases. She is going to perform her HEP of repeated extension and repeated RSB  every hour to see if her pain decreases.                              PT Education - 12/18/15 0903    Education provided Yes   Education Details HEP   Person(s) Educated Patient   Methods Explanation   Comprehension Verbalized understanding             PT Long Term Goals - 12/05/15 1327      PT LONG TERM GOAL #1   Title Pt will be independent with HEP in order to decrease symptoms and improve pain-free function at home and work   Time 6   Period Weeks   Status New     PT LONG TERM GOAL #2   Title Pt will score less than 10% on NDI in order to demonstrate significant reducation in disability related to neck pain   Baseline 12/03/15: 16%   Time 6   Period Weeks   Status New     PT LONG TERM GOAL #3   Title Pt will report worst neck pain as less than or equal to 3/10 on NPRS in order to demonstrate singificant improvement in neck pain  Baseline 12/03/15: 6/10   Time 6   Period Weeks   Status New               Plan - 12/18/15 0929    Clinical Impression Statement Patient reports neck pain that is intermittent and worse in the AM and improves as the day progresses. She has numbness and tingling in her RUE down to her fingers that is intermittent. She responds to traction  moderately and repsonds to repeated right SB and repeated extension.    Rehab Potential Excellent   Clinical Impairments Affecting Rehab Potential Positive: motivation, prior improvement; Negative: chronicity   PT Frequency 2x / week   PT Duration 6 weeks   PT Treatment/Interventions ADLs/Self Care Home Management;Aquatic Therapy;Cryotherapy;Electrical Stimulation;Iontophoresis 4mg /ml Dexamethasone;Moist Heat;Traction;Ultrasound;Therapeutic exercise;Neuromuscular re-education;Patient/family education;Manual techniques;Passive range of motion;Dry needling   PT Next Visit Plan Central and unilateral lower cervical/upper thoracic mobilizations, first rib mobilizations, median  nerve glides, scalene stretch, STM   PT Home Exercise Plan Scalene stretch, repeated cervical retractions, thoracic extension mobility, cervical retractions seated, median nerve glides   Consulted and Agree with Plan of Care Patient      Patient will benefit from skilled therapeutic intervention in order to improve the following deficits and impairments:  Pain  Visit Diagnosis: Cervicalgia     Problem List Patient Active Problem List   Diagnosis Date Noted  . Cephalalgia 10/14/2015  . PMS (premenstrual syndrome) 10/14/2015   Alanson Puls, PT, DPT [place], Minette [place]land S 12/18/2015, 10:05 AM  [place] MAIN [facility] SERVICES [address] [place], [place], [address] Phone: [phone]   Fax:  [phone]  Name: Stephanie Chambers MRN: [id number] Date of Birth: 17-Oct-1975

## 2015-12-24 ENCOUNTER — Other Ambulatory Visit: Payer: Self-pay | Admitting: Unknown Physician Specialty

## 2015-12-24 ENCOUNTER — Ambulatory Visit: Payer: 59 | Attending: Physician Assistant | Admitting: Physical Therapy

## 2015-12-24 VITALS — BP 101/25 | HR 64

## 2015-12-24 DIAGNOSIS — M542 Cervicalgia: Secondary | ICD-10-CM | POA: Insufficient documentation

## 2015-12-24 DIAGNOSIS — J019 Acute sinusitis, unspecified: Secondary | ICD-10-CM | POA: Diagnosis not present

## 2015-12-24 DIAGNOSIS — M6281 Muscle weakness (generalized): Secondary | ICD-10-CM | POA: Insufficient documentation

## 2015-12-24 DIAGNOSIS — H93A1 Pulsatile tinnitus, right ear: Secondary | ICD-10-CM | POA: Diagnosis not present

## 2015-12-24 DIAGNOSIS — M792 Neuralgia and neuritis, unspecified: Secondary | ICD-10-CM | POA: Diagnosis not present
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
## 2015-12-26 ENCOUNTER — Ambulatory Visit: Payer: 59

## 2015-12-29 ENCOUNTER — Ambulatory Visit: Payer: 59

## 2015-12-31 ENCOUNTER — Ambulatory Visit: Payer: 59 | Admitting: Physical Therapy

## 2016-01-02 ENCOUNTER — Ambulatory Visit
Admission: RE | Admit: 2016-01-02 | Discharge: 2016-01-02 | Disposition: A | Discharge status: Home / Self Care | Payer: 59 | Source: Ambulatory Visit | Attending: Unknown Physician Specialty | Admitting: Unknown Physician Specialty

## 2016-01-02 DIAGNOSIS — H93A9 Pulsatile tinnitus, unspecified ear: Secondary | ICD-10-CM | POA: Diagnosis not present

## 2016-01-02 DIAGNOSIS — M50223 Other cervical disc displacement at C6-C7 level: Secondary | ICD-10-CM | POA: Diagnosis not present

## 2016-01-02 DIAGNOSIS — M792 Neuralgia and neuritis, unspecified: Secondary | ICD-10-CM | POA: Diagnosis not present

## 2016-01-02 DIAGNOSIS — H93A1 Pulsatile tinnitus, right ear: Secondary | ICD-10-CM | POA: Diagnosis not present

## 2016-01-02 DIAGNOSIS — M50222 Other cervical disc displacement at C5-C6 level: Secondary | ICD-10-CM | POA: Diagnosis not present

## 2016-01-02 MED ORDER — GADOBENATE DIMEGLUMINE 529 MG/ML IV SOLN
10.0000 mL | Freq: Once | INTRAVENOUS | Status: AC | PRN
  Administered 2016-01-02: 10 mL via INTRAVENOUS

## 2016-01-05 ENCOUNTER — Ambulatory Visit: Payer: 59 | Admitting: Physical Therapy

## 2016-01-06 DIAGNOSIS — J309 Allergic rhinitis, unspecified: Secondary | ICD-10-CM | POA: Diagnosis not present

## 2016-01-06 DIAGNOSIS — M47812 Spondylosis without myelopathy or radiculopathy, cervical region: Secondary | ICD-10-CM | POA: Diagnosis not present

## 2016-01-06 DIAGNOSIS — H93A9 Pulsatile tinnitus, unspecified ear: Secondary | ICD-10-CM | POA: Diagnosis not present

## 2016-01-07 ENCOUNTER — Ambulatory Visit: Payer: 59

## 2016-01-13 ENCOUNTER — Ambulatory Visit: Payer: 59

## 2016-01-13 DIAGNOSIS — M542 Cervicalgia: Secondary | ICD-10-CM | POA: Diagnosis not present

## 2016-01-13 DIAGNOSIS — M6281 Muscle weakness (generalized): Secondary | ICD-10-CM
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
## 2016-01-15 ENCOUNTER — Ambulatory Visit: Payer: 59 | Admitting: Physical Therapy

## 2016-01-15 DIAGNOSIS — M542 Cervicalgia: Secondary | ICD-10-CM

## 2016-01-15 DIAGNOSIS — M6281 Muscle weakness (generalized): Secondary | ICD-10-CM | POA: Diagnosis not present
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
## 2016-01-23 ENCOUNTER — Ambulatory Visit: Payer: 59

## 2016-01-27 ENCOUNTER — Ambulatory Visit: Payer: 59 | Admitting: Physical Therapy

## 2016-01-30 ENCOUNTER — Ambulatory Visit: Payer: 59 | Attending: Physician Assistant

## 2016-01-30 DIAGNOSIS — M542 Cervicalgia: Secondary | ICD-10-CM | POA: Diagnosis not present

## 2016-01-30 DIAGNOSIS — M6281 Muscle weakness (generalized): Secondary | ICD-10-CM | POA: Insufficient documentation
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
## 2016-02-03 ENCOUNTER — Ambulatory Visit: Payer: 59 | Admitting: Physical Therapy

## 2016-02-03 ENCOUNTER — Encounter: Payer: Self-pay | Admitting: Physical Therapy

## 2016-02-03 DIAGNOSIS — M542 Cervicalgia: Secondary | ICD-10-CM

## 2016-02-03 DIAGNOSIS — M6281 Muscle weakness (generalized): Secondary | ICD-10-CM
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
## 2016-02-11 ENCOUNTER — Telehealth: Payer: Self-pay | Admitting: Obstetrics and Gynecology

## 2016-02-11 NOTE — Telephone Encounter (Signed)
Stephanie Chambers has had right breast pain for 6wks. It started up under her arm and now around the 3 o'colock position. She has a mammogram on 11/14, should that be changed to a diagnostic bc of the pain, she said no lumps or bumps. Please call to inform what she needs to do.

## 2016-02-11 NOTE — Telephone Encounter (Signed)
Advised pt mad will want to see her before ordering a dx mammo. Appt made for 02/12/16 at 1pm.

## 2016-02-12 ENCOUNTER — Ambulatory Visit: Payer: 59 | Admitting: Physical Therapy

## 2016-02-12 ENCOUNTER — Ambulatory Visit (INDEPENDENT_AMBULATORY_CARE_PROVIDER_SITE_OTHER): Payer: 59 | Admitting: Obstetrics and Gynecology

## 2016-02-12 ENCOUNTER — Encounter: Payer: Self-pay | Admitting: Obstetrics and Gynecology

## 2016-02-12 VITALS — BP 112/78 | HR 74 | Ht 66.5 in | Wt 141.0 lb

## 2016-02-12 DIAGNOSIS — Z803 Family history of malignant neoplasm of breast: Secondary | ICD-10-CM

## 2016-02-12 DIAGNOSIS — N644 Mastodynia: Secondary | ICD-10-CM | POA: Diagnosis not present

## 2016-02-12 NOTE — Progress Notes (Signed)
GYN ENCOUNTER NOTE  Subjective:       Stephanie Chambers is a 40 y.o. G1P1 female is here for gynecologic evaluation of the following issues:  1. Right breast pain for the past 5-6 weeks. Pain began in the right axilla, then began radiating through the entire breast and is not localized to the medial lower quadrant. No masses, lumps or inflammation felt. Denies any redness or nipple discharge. Pain began 2 weeks before he menstrual cycle began and has persisted since. Cyclic breast pain the past but it has never felt like this or persisted this long. FmHx includes mother diagnosed with breast cancer at age 28.  Family history of colon cancer in father as well as renal cancer in father   Gynecologic History Patient's last menstrual period was 01/26/2016 (exact date). Last mammogram: 30/30/2016. Results were: normal  Obstetric History OB History  Gravida Para Term Preterm AB Living  1 1          SAB TAB Ectopic Multiple Live Births               # Outcome Date GA Lbr Len/2nd Weight Sex Delivery Anes PTL Lv  1 Para             Obstetric Comments  1st Menstrual Cycle:  11  1st Pregnancy:  31    Past Medical History:  Diagnosis Date  . Anemia   . Anxiety   . Bulging of cervical intervertebral disc   . Constipation   . Depression    major  . DUB (dysfunctional uterine bleeding)   . Endometrial polyp 05/2012   hysteroscopy w/ d &C  . Epigastric abdominal pain   . Family history of breast cancer in mother   . Lump in female breast   . Mastodynia   . Menorrhagia   . UTI (lower urinary tract infection)     Past Surgical History:  Procedure Laterality Date  . DIAGNOSTIC LAPAROSCOPY  2004  . DILATION AND CURETTAGE OF UTERUS    . HYSTEROSCOPY    . TONSILLECTOMY  1998    Current Outpatient Prescriptions on File Prior to Visit  Medication Sig Dispense Refill  . buPROPion (WELLBUTRIN SR) 150 MG 12 hr tablet Take 1 tablet (150 mg total) by mouth 2 (two) times daily. 120 tablet 6    No current facility-administered medications on file prior to visit.     Allergies  Allergen Reactions  . Other Shortness Of Breath    omnicef    Social History   Social History  . Marital status: Married    Spouse name: N/A  . Number of children: N/A  . Years of education: N/A   Occupational History  . Not on file.   Social History Main Topics  . Smoking status: Former Research scientist (life sciences)  . Smokeless tobacco: Never Used  . Alcohol use 0.0 oz/week     Comment: occas  . Drug use: No  . Sexual activity: Yes    Birth control/ protection: None   Other Topics Concern  . Not on file   Social History Narrative  . No narrative on file    Family History  Problem Relation Age of Onset  . Cancer Mother 2    breast with liver mets  . Cancer Father 21    colon, lung, kidney  . Heart disease Father   . Depression Maternal Grandmother   . Cancer Maternal Grandmother     colon  . Depression Paternal Grandmother   .  Heart disease Paternal Grandfather     The following portions of the patient's history were reviewed and updated as appropriate: allergies, current medications, past family history, past medical history, past social history, past surgical history and problem list.  Review of Systems Review of Systems - Per history of present illness  Objective:   BP 112/78   Pulse 74   Ht 5' 6.5" (1.689 m)   Wt 141 lb (64 kg)   LMP 01/26/2016 (Exact Date)   BMI 22.42 kg/m  CONSTITUTIONAL: Well-developed, well-nourished female in no acute distress.  HENT:  Normocephalic, atraumatic.  NECK: Normal range of motion, supple, no masses.  Normal thyroid.  SKIN: Skin is warm and dry. No rash noted. Not diaphoretic. No erythema. No pallor. [place]: Alert and oriented to person, place, and time. PSYCHIATRIC: Normal mood and affect. Normal behavior. Normal judgment and thought content. CARDIOVASCULAR:Not Examined RESPIRATORY: Not Examined BREASTS: bilaterally symmetric without mass,  adenopathy or nipple discharge ABDOMEN: Soft, non distended; Non tender.  No Organomegaly.     Assessment:   Right breast pain- breast exam was completely benign, last mammogram(3D) was over a year ago-BI-RADS 1 (06/20/14)- MM ordered for November 14th, 2017   Plan:   MM Digital Screening Bilateral- November 14th, 2017 Consider genetic testing  Return for annual exam as scheduled  A total of 15 minutes were spent face-to-face with the patient during this encounter and over half of that time dealt with counseling and coordination of care.    Alla German, PA-S Brayton Mars, MD   I have seen, interviewed, and examined the patient in conjunction with the [facility].A. student and affirm the diagnosis and management plan. Wadie Mattie A. Marysa Wessner, MD, FACOG   Note: This dictation was prepared with Dragon dictation along with smaller phrase technology. Any transcriptional errors that result from this process are unintentional.

## 2016-02-12 NOTE — Patient Instructions (Signed)
1. 3-D mammogram ordered 2. Return for annual exam as scheduled

## 2016-02-17 ENCOUNTER — Encounter: Payer: Self-pay | Admitting: Physical Therapy

## 2016-02-26 ENCOUNTER — Ambulatory Visit: Payer: 59 | Attending: Physician Assistant | Admitting: Physical Therapy

## 2016-03-02 ENCOUNTER — Ambulatory Visit
Admission: RE | Admit: 2016-03-02 | Discharge: 2016-03-02 | Disposition: A | Discharge status: Home / Self Care | Payer: 59 | Source: Ambulatory Visit | Attending: Obstetrics and Gynecology | Admitting: Obstetrics and Gynecology

## 2016-03-02 DIAGNOSIS — Z1239 Encounter for other screening for malignant neoplasm of breast: Secondary | ICD-10-CM

## 2016-03-02 DIAGNOSIS — Z1231 Encounter for screening mammogram for malignant neoplasm of breast: Secondary | ICD-10-CM | POA: Diagnosis not present

## 2016-04-16 ENCOUNTER — Encounter: Payer: Self-pay | Admitting: Certified Nurse Midwife

## 2016-04-16 ENCOUNTER — Telehealth: Payer: Self-pay | Admitting: Obstetrics and Gynecology

## 2016-04-16 ENCOUNTER — Ambulatory Visit (INDEPENDENT_AMBULATORY_CARE_PROVIDER_SITE_OTHER): Payer: 59 | Admitting: Certified Nurse Midwife

## 2016-04-16 VITALS — BP 99/62 | HR 77 | Temp 99.0°F | Wt 142.0 lb

## 2016-04-16 DIAGNOSIS — R51 Headache: Secondary | ICD-10-CM

## 2016-04-16 DIAGNOSIS — R52 Pain, unspecified: Secondary | ICD-10-CM | POA: Diagnosis not present

## 2016-04-16 DIAGNOSIS — R300 Dysuria: Secondary | ICD-10-CM | POA: Diagnosis not present

## 2016-04-16 DIAGNOSIS — R6883 Chills (without fever): Secondary | ICD-10-CM | POA: Diagnosis not present

## 2016-04-16 DIAGNOSIS — R519 Headache, unspecified: Secondary | ICD-10-CM

## 2016-04-16 LAB — POCT URINALYSIS DIPSTICK
Bilirubin, UA: NEGATIVE
Blood, UA: NEGATIVE
Glucose, UA: NEGATIVE
LEUKOCYTES UA: NEGATIVE
NITRITE UA: NEGATIVE
PH UA: 5
PROTEIN UA: NEGATIVE
Spec Grav, UA: >=1.03
UROBILINOGEN UA: NEGATIVE

## 2016-04-16 LAB — POCT INFLUENZA A/B
INFLUENZA A, POC: NEGATIVE
Influenza B, POC: NEGATIVE

## 2016-04-16 LAB — POCT RAPID STREP A (OFFICE): RAPID STREP A SCREEN: NEGATIVE

## 2016-04-16 MED ORDER — PHENAZOPYRIDINE HCL 200 MG PO TABS: 200.0000 mg | ORAL_TABLET | Freq: Three times a day (TID) | ORAL | 1 refills | Status: DC | PRN

## 2016-04-16 MED ORDER — NITROFURANTOIN MONOHYD MACRO 100 MG PO CAPS: 100.0000 mg | ORAL_CAPSULE | Freq: Two times a day (BID) | ORAL | 0 refills | Status: AC

## 2016-04-16 NOTE — Telephone Encounter (Signed)
Pt called and stated she thinks she has a UTI, she hadnt had one in so long, she stated she isnt sure if it could be the flu too, she isn't running a fever but she is having body aches. Pt wanted to be seen, but wasn't sure if she needed to be seen for flu or UTI urine drop off.

## 2016-04-16 NOTE — Progress Notes (Signed)
SUBJECTIVE: Stephanie Chambers is a 40 y.o. female who complains of urinary urgency and dysuria x 1 day, with chills, body aches, and headaches x 3 days.   No fever, hematuria, vaginal bleeding or urinary frequency.   Hx: Chronic UTIs, but pt states, she " hasn't had one in years".   Pt questions if these symptoms are UTI related or flu related.   OBJECTIVE:   Blood pressure 99/62, pulse 77, temperature 99 F (37.2 C), weight 142 lb (64.4 kg), last menstrual period 04/05/2016.  Appears well, in no apparent distress.    No CVA tenderness  Urine dipstick shows negative for all components.   Urine culture: pending results  Rapid flu: negative  Rapid strep: negative  ASSESSMENT:  Dysuria  PLAN:   1. Macrobid and Pyridium per orders.  2. Advised pt that I would call or MyChart with results of urine culture.   3. Call or return to clinic prn if these symptoms worsen or fail to improve as anticipated.  Diona Fanti, CNM

## 2016-04-16 NOTE — Patient Instructions (Signed)
Urinary Tract Infection, Adult Introduction A urinary tract infection (UTI) is an infection of any part of the urinary tract. The urinary tract includes the:  Kidneys.  Ureters.  Bladder.  Urethra. These organs make, store, and get rid of pee (urine) in the body. Follow these instructions at home:  Take over-the-counter and prescription medicines only as told by your doctor.  If you were prescribed an antibiotic medicine, take it as told by your doctor. Do not stop taking the antibiotic even if you start to feel better.  Avoid the following drinks:  Alcohol.  Caffeine.  Tea.  Carbonated drinks.  Drink enough fluid to keep your pee clear or pale yellow.  Keep all follow-up visits as told by your doctor. This is important.  Make sure to:  Empty your bladder often and completely. Do not to hold pee for long periods of time.  Empty your bladder before and after sex.  Wipe from front to back after a bowel movement if you are female. Use each tissue one time when you wipe. Contact a doctor if:  You have back pain.  You have a fever.  You feel sick to your stomach (nauseous).  You throw up (vomit).  Your symptoms do not get better after 3 days.  Your symptoms go away and then come back. Get help right away if:  You have very bad back pain.  You have very bad lower belly (abdominal) pain.  You are throwing up and cannot keep down any medicines or water. This information is not intended to replace advice given to you by your health care provider. Make sure you discuss any questions you have with your health care provider. Document Released: 09/22/2007 Document Revised: 09/11/2015 Document Reviewed: 02/24/2015  2017 Elsevier  

## 2016-04-16 NOTE — Progress Notes (Signed)
Pt is here with c/o poss UTI. Is c/o headache, body aches and chills. Denies fever. C/o urgency, frequency, bloating and burning.

## 2016-04-16 NOTE — Telephone Encounter (Signed)
Pt has had a h/a x 2d. Body aches and chills. No fever that she is aware of. Having pain, burning, urgency, and frequency with urination. NO cough or congestion. Appt made with JML at 1:30 today.

## 2016-04-18 LAB — URINE CULTURE

## 2016-04-21 ENCOUNTER — Telehealth: Payer: Self-pay

## 2016-04-21 ENCOUNTER — Ambulatory Visit: Payer: Self-pay | Admitting: Physician Assistant

## 2016-04-21 ENCOUNTER — Encounter: Payer: Self-pay | Admitting: Physician Assistant

## 2016-04-21 VITALS — BP 126/57 | HR 82 | Temp 99.1°F

## 2016-04-21 DIAGNOSIS — R1031 Right lower quadrant pain: Secondary | ICD-10-CM

## 2016-04-21 DIAGNOSIS — N390 Urinary tract infection, site not specified: Secondary | ICD-10-CM

## 2016-04-21 LAB — POCT URINE PREGNANCY: Preg Test, Ur: NEGATIVE

## 2016-04-21 LAB — POCT URINALYSIS DIPSTICK
BILIRUBIN UA: NEGATIVE
GLUCOSE UA: NEGATIVE
KETONES UA: NEGATIVE
NITRITE UA: NEGATIVE
Protein, UA: NEGATIVE
RBC UA: NEGATIVE
Spec Grav, UA: 1.025
Urobilinogen, UA: 0.2
pH, UA: 6

## 2016-04-21 MED ORDER — CIPROFLOXACIN HCL 500 MG PO TABS: 500.0000 mg | ORAL_TABLET | Freq: Two times a day (BID) | ORAL | 0 refills | Status: DC

## 2016-04-21 NOTE — Progress Notes (Signed)
S: c/o low grade fever for about a week, some nausea, pelvic pain, headache and achy, no cough or congestion, no vag discharge, was seen at [facility] and urine was negative, was given Macrobid until culture results came in, flu and strep swabs were negative, no v/, did have a little diarrhea but not really bad  O: vitals wnl, nad, ENT with swollen nasal mucosa , red b/l, throat wnl, neck supple no lymph, lungs c t a, cv rrr, abd soft a little tender in rlq, bs normal, ua with trace leuks, urine preg negative  A: uti, rlq pain  P: cipro 500mg  bid, stop macrobid, if rlq pain increases pt is to go to ER, return if not improved by Monday, return earlier if worsening

## 2016-04-21 NOTE — Telephone Encounter (Signed)
Attempted to contact pt-no answer and unable to leave message

## 2016-04-21 NOTE — Addendum Note (Signed)
Addended by: Versie Starks on: 04/21/2016 04:51 PM   Modules accepted: Orders

## 2016-04-22 NOTE — Telephone Encounter (Addendum)
Spoke with pt to inform her of neg urine culture and to stop antibiotic per ML. Pt states she went to walk in clinic for UTI symptoms and was given a script for Cipro. Pt states " since my urine specimen was mixed up with someone elses I shouldn't be charged". Please advise. 11:40 left message for pt to call us back with information on who told her her urine culture got "mixed up with someone elses".

## 2016-04-22 NOTE — Telephone Encounter (Signed)
Spoke with pt- at last visit patient left a urine specimen but her name was not on container. There were 2 specimens in door. This writer  assumed the one that was tested was hers. Pt is requesting she not be charged for this.

## 2016-04-22 NOTE — Telephone Encounter (Deleted)
04/22/16 11:40 left message for pt to call us back regarding information on who told pt her urine culture got "mixed up".

## 2016-05-13 ENCOUNTER — Ambulatory Visit: Payer: Self-pay | Admitting: Physician Assistant

## 2016-05-13 ENCOUNTER — Encounter: Payer: Self-pay | Admitting: Physician Assistant

## 2016-05-13 ENCOUNTER — Other Ambulatory Visit
Admission: RE | Admit: 2016-05-13 | Discharge: 2016-05-13 | Disposition: A | Discharge status: Home / Self Care | Payer: 59 | Source: Ambulatory Visit | Attending: Physician Assistant | Admitting: Physician Assistant

## 2016-05-13 VITALS — BP 100/70 | HR 86 | Temp 98.8°F

## 2016-05-13 DIAGNOSIS — R300 Dysuria: Secondary | ICD-10-CM

## 2016-05-13 DIAGNOSIS — N390 Urinary tract infection, site not specified: Secondary | ICD-10-CM | POA: Diagnosis not present

## 2016-05-13 LAB — POCT URINALYSIS DIPSTICK
Bilirubin, UA: NEGATIVE
Blood, UA: NEGATIVE
GLUCOSE UA: NEGATIVE
Ketones, UA: NEGATIVE
LEUKOCYTES UA: NEGATIVE
NITRITE UA: NEGATIVE
Protein, UA: NEGATIVE
Spec Grav, UA: <=1.005
UROBILINOGEN UA: 0.2
pH, UA: 5.5

## 2016-05-13 MED ORDER — CIPROFLOXACIN HCL 500 MG PO TABS
500.0000 mg | ORAL_TABLET | Freq: Two times a day (BID) | ORAL | 0 refills | Status: DC
Start: 2016-05-13 — End: 2016-10-04

## 2016-05-13 NOTE — Progress Notes (Signed)
   Subjective:    Patient ID: Stephanie Chambers, female    DOB: [birth year], 41 y.o.   MRN: [id number]  HPI Patient c/o urinary urgency/frequency, LBP, and lower abdominal buring since last night. Denies fever/chill, or vaginal discharge. History of UTI with negative UA that are positive with cultures.    Review of Systems Anxiety and UTI    Objective:   Physical Exam No obvious abdominal distention, normoactive bowel sounds,and no guarding with palpation. No CVA guarding. F/E ROM of lumbar spine.       Assessment & Plan:UTI  Start Cipro and Pyridium pending Urine Culture. Follow up in 4 days.

## 2016-05-14 LAB — URINE CULTURE: Culture: NO GROWTH

## 2016-05-19 ENCOUNTER — Telehealth: Payer: Self-pay | Admitting: Physician Assistant

## 2016-05-19 ENCOUNTER — Encounter: Payer: Self-pay | Admitting: Obstetrics and Gynecology

## 2016-05-19 ENCOUNTER — Ambulatory Visit (INDEPENDENT_AMBULATORY_CARE_PROVIDER_SITE_OTHER): Payer: 59 | Admitting: Obstetrics and Gynecology

## 2016-05-19 VITALS — BP 120/74 | HR 80 | Temp 98.5°F | Ht 66.5 in | Wt 142.3 lb

## 2016-05-19 DIAGNOSIS — R102 Pelvic and perineal pain: Secondary | ICD-10-CM

## 2016-05-19 DIAGNOSIS — R350 Frequency of micturition: Secondary | ICD-10-CM | POA: Diagnosis not present

## 2016-05-19 DIAGNOSIS — R3915 Urgency of urination: Secondary | ICD-10-CM | POA: Diagnosis not present

## 2016-05-19 NOTE — Patient Instructions (Signed)
1. Referral to Dr. Erlene Quan urology   Interstitial Cystitis Introduction Interstitial cystitis is a condition that causes inflammation of the bladder. The bladder is a hollow organ in the lower part of your abdomen. It stores urine after the urine is made by your kidneys. With interstitial cystitis, you may have pain in the bladder area. You may also have a frequent and urgent need to urinate. The severity of interstitial cystitis can vary from person to person. You may have flare-ups of the condition, and then it may go away for a while. For many people who have this condition, it becomes a long-term problem. What are the causes? The cause of this condition is not known. What increases the risk? This condition is more likely to develop in women. What are the signs or symptoms? Symptoms of interstitial cystitis vary, and they can change over time. Symptoms may include:  Discomfort or pain in the bladder area. This can range from mild to severe. The pain may change in intensity as the bladder fills with urine or as it empties.  Pelvic pain.  An urgent need to urinate.  Frequent urination.  Pain during sexual intercourse.  Pinpoint bleeding on the bladder wall. For women, the symptoms often get worse during menstruation. How is this diagnosed? This condition is diagnosed by evaluating your symptoms and ruling out other causes. A physical exam will be done. Various tests may be done to rule out other conditions. Common tests include:  Urine tests.  Cystoscopy. In this test, a tool that is like a very thin telescope is used to look into your bladder.  Biopsy. This involves taking a sample of tissue from the bladder wall to be examined under a microscope. How is this treated? There is no cure for interstitial cystitis, but treatment methods are available to control your symptoms. Work closely with your health care provider to find the treatments that will be most effective for you.  Treatment options may include:  Medicines to relieve pain and to help reduce the number of times that you feel the need to urinate.  Bladder training. This involves learning ways to control when you urinate, such as:  Urinating at scheduled times.  Training yourself to delay urination.  Doing exercises (Kegel exercises) to strengthen the muscles that control urine flow.  Lifestyle changes, such as changing your diet or taking steps to control stress.  Use of a device that provides electrical stimulation in order to reduce pain.  A procedure that stretches your bladder by filling it with air or fluid.  Surgery. This is rare. It is only done for extreme cases if other treatments do not help. Follow these instructions at home:  Take medicines only as directed by your health care provider.  Use bladder training techniques as directed.  Keep a bladder diary to find out which foods, liquids, or activities make your symptoms worse.  Use your bladder diary to schedule bathroom trips. If you are away from home, plan to be near a bathroom at each of your scheduled times.  Make sure you urinate just before you leave the house and just before you go to bed.  Do Kegel exercises as directed by your health care provider.  Do not drink alcohol.  Do not use any tobacco products, including cigarettes, chewing tobacco, or electronic cigarettes. If you need help quitting, ask your health care provider.  Make dietary changes as directed by your health care provider. You may need to avoid spicy foods and  foods that contain a high amount of potassium.  Limit your drinking of beverages that stimulate urination. These include soda, coffee, and tea.  Keep all follow-up visits as directed by your health care provider. This is important. Contact a health care provider if:  Your symptoms do not get better after treatment.  Your pain and discomfort are getting worse.  You have more frequent urges  to urinate.  You have a fever. Get help right away if:  You are not able to control your bladder at all. This information is not intended to replace advice given to you by your health care provider. Make sure you discuss any questions you have with your health care provider. Document Released: 12/05/2003 Document Revised: 09/11/2015 Document Reviewed: 12/11/2013  2017 [employer]vier

## 2016-05-19 NOTE — Telephone Encounter (Signed)
Urine culture from 05/13/16 showed no growth so it was negative

## 2016-05-20 ENCOUNTER — Telehealth: Payer: Self-pay

## 2016-05-20 LAB — URINALYSIS, ROUTINE W REFLEX MICROSCOPIC
Bilirubin, UA: NEGATIVE
GLUCOSE, UA: NEGATIVE
Ketones, UA: NEGATIVE
NITRITE UA: POSITIVE — AB
PH UA: 5 (ref 5.0–7.5)
Protein, UA: NEGATIVE
RBC, UA: NEGATIVE
Specific Gravity, UA: 1.027 (ref 1.005–1.030)
UUROB: 1 mg/dL (ref 0.2–1.0)

## 2016-05-20 LAB — MICROSCOPIC EXAMINATION: CASTS: NONE SEEN /LPF

## 2016-05-20 MED ORDER — MIRABEGRON ER 25 MG PO TB24: 25.0000 mg | ORAL_TABLET | Freq: Every day | ORAL | 1 refills | Status: DC

## 2016-05-20 MED ORDER — NITROFURANTOIN MONOHYD MACRO 100 MG PO CAPS: 100.0000 mg | ORAL_CAPSULE | Freq: Two times a day (BID) | ORAL | 0 refills | Status: DC

## 2016-05-20 NOTE — Telephone Encounter (Signed)
-----   Message from Brayton Mars, MD sent at 05/20/2016  8:17 AM EST ----- Please notify - Abnormal Labs Please: Macrobid twice a day for 7 days

## 2016-05-20 NOTE — Telephone Encounter (Signed)
Pt aware. Med erx. Advised to keep uro appt.

## 2016-05-20 NOTE — Progress Notes (Signed)
Chief complaint: 1. Irritative voiding symptoms 2. Recent urine culture negative for infection  Patient presents for evaluation of chronic irritative voiding symptoms including urinary frequency and urgency. She has had UTIs in the past report antibiotic therapy. She has had her bladder stem (stretched) by Dr. Elisabeth Pigeon past with improvement in symptomatology. She has never seen a urologist.  Most recent rheumatology includes urinary frequency upwards 14 times a day as well as nocturia 4 times at night. She does have urgency without urinary leakage. She does not have stress incontinence.  This past month she was seen in urgent care, treated with Cipro 7 days without improvement in symptoms; urine culture from that visit was negative for infection.  Past medical history, past surgical history, problem list, medications, and allergies are reviewed  OBJECTIVE: BP 120/74   Pulse 80   Temp 98.5 F (36.9 C)   Ht 5' 6.5" (1.689 m)   Wt 142 lb 4.8 oz (64.5 kg)   LMP 05/03/2016 (Exact Date)   BMI 22.62 kg/m  Physical exam:-Deferred  ASSESSMENT: 1. Irritative voiding symptoms with negative urine culture no improvement following antibiotic therapy 2. Cannot rule out other potential etiologies including interstitial cystitis,unstable bladder, or infection.  PLAN: 1. Urinalysis and urine culture 2. Referral to urology for cystoscopy workup 3. Finish seven-day course of Cipro as previously written  A total of 15 minutes were spent face-to-face with the patient during this encounter and over half of that time dealt with counseling and coordination of care.  Brayton Mars, MD  Note: This dictation was prepared with Dragon dictation along with smaller phrase technology. Any transcriptional errors that result from this process are unintentional.

## 2016-05-21 LAB — URINE CULTURE: Organism ID, Bacteria: NO GROWTH

## 2016-09-20 ENCOUNTER — Telehealth: Payer: Self-pay | Admitting: Obstetrics and Gynecology

## 2016-09-20 NOTE — Telephone Encounter (Signed)
Patient's mom was just diagnosed with cancer and she has declined to proceed with treatment. Lamisha asked if you could possible call her in something to take the edge off - she is having difficulty dealing and keeping it together @ work. She also requested the lowest dose available  Call into the [facility] please

## 2016-09-21 MED ORDER — LORAZEPAM 1 MG PO TABS: 1.0000 mg | ORAL_TABLET | Freq: Two times a day (BID) | ORAL | 0 refills | Status: DC

## 2016-09-21 NOTE — Telephone Encounter (Signed)
Pt aware. Rx printed and left up front.

## 2016-10-04 ENCOUNTER — Ambulatory Visit: Payer: Self-pay | Admitting: Physician Assistant

## 2016-10-04 ENCOUNTER — Encounter: Payer: Self-pay | Admitting: Physician Assistant

## 2016-10-04 ENCOUNTER — Telehealth: Payer: Self-pay | Admitting: Obstetrics and Gynecology

## 2016-10-04 VITALS — BP 110/65 | HR 63 | Temp 98.5°F | Resp 16

## 2016-10-04 DIAGNOSIS — R51 Headache: Principal | ICD-10-CM

## 2016-10-04 DIAGNOSIS — R519 Headache, unspecified: Secondary | ICD-10-CM

## 2016-10-04 MED ORDER — PREDNISONE 10 MG PO TABS: 30.0000 mg | ORAL_TABLET | Freq: Every day | ORAL | 0 refills | Status: DC

## 2016-10-04 NOTE — Progress Notes (Signed)
S: C/o headache and facial pressure for 3 days, no fever, chills, cp/sob, v/d; no mucus production, does have some ear pain Using otc meds:   O: PE: vitals wnl,nad, perrl eomi, normocephalic, tms dull, nasal mucosa red and swollen, throat injected, neck supple no lymph, lungs c t a, cv rrr, neuro intact  A:  Acute sinusitis   P: drink fluids, continue regular meds , use otc meds of choice, return if not improving in 5 days, return earlier if worsening . pred 30mg  qd x 3d, if not better by thurs will call in an antibiotic

## 2016-10-04 NOTE — Progress Notes (Signed)
110/65  

## 2016-10-04 NOTE — Telephone Encounter (Signed)
Pt states she picked up her rx. She is having a bad day today. Advised to take her meds and contact office if she needs anything.

## 2016-10-04 NOTE — Telephone Encounter (Signed)
Patient would like to speak with "Joyice Faster" in regards to a medication she is taking and also wanted to tell Crystal thank you. Patient did not disclose any other information. Please advise.

## 2016-10-07 ENCOUNTER — Telehealth: Payer: Self-pay | Admitting: Physician Assistant

## 2016-10-07 MED ORDER — FLUCONAZOLE 150 MG PO TABS: 150.0000 mg | ORAL_TABLET | Freq: Once | ORAL | 0 refills | Status: AC

## 2016-10-07 MED ORDER — AMOXICILLIN 875 MG PO TABS: 875.0000 mg | ORAL_TABLET | Freq: Two times a day (BID) | ORAL | 0 refills | Status: DC

## 2016-10-07 NOTE — Telephone Encounter (Signed)
Pt states sinus pressure is worse and is now going into her chest Called in rx amoxil and diflucan to [facility]

## 2016-10-14 ENCOUNTER — Encounter: Payer: Self-pay | Admitting: Physician Assistant

## 2016-10-14 ENCOUNTER — Ambulatory Visit: Payer: Self-pay | Admitting: Physician Assistant

## 2016-10-14 VITALS — BP 100/80 | HR 80 | Temp 98.3°F

## 2016-10-14 DIAGNOSIS — J0100 Acute maxillary sinusitis, unspecified: Secondary | ICD-10-CM

## 2016-10-14 MED ORDER — FLUTICASONE PROPIONATE 50 MCG/ACT NA SUSP: 2.0000 | Freq: Every day | NASAL | 12 refills | Status: DC

## 2016-10-14 NOTE — Progress Notes (Signed)
S: pt on amoxil, has 4 days left, is going out of town and is worried she is getting worse, no fever/chills, mucus is still a little green but better, no cp/sob  O: vitals wnl, nad, tms clear, nasal mucosa pink on left, a little red on r side, throat wnl, neck supple no lymph, lungs c t a, cv rrr  A: resolving sinusitis  P: refill on flonase, if getting worse next week will call in an additional antibiotic

## 2016-11-17 NOTE — Progress Notes (Signed)
This encounter was created in error - please disregard.

## 2016-11-22 ENCOUNTER — Ambulatory Visit: Payer: Self-pay | Admitting: Physician Assistant

## 2016-11-22 ENCOUNTER — Encounter: Payer: Self-pay | Admitting: Physician Assistant

## 2016-11-22 VITALS — BP 100/80 | HR 69 | Temp 98.4°F

## 2016-11-22 DIAGNOSIS — J0100 Acute maxillary sinusitis, unspecified: Secondary | ICD-10-CM

## 2016-11-22 MED ORDER — FLUTICASONE PROPIONATE 50 MCG/ACT NA SUSP: 2.0000 | Freq: Every day | NASAL | 12 refills | Status: DC

## 2016-11-22 MED ORDER — AZITHROMYCIN 250 MG PO TABS: ORAL_TABLET | ORAL | 0 refills | Status: DC

## 2016-11-22 NOTE — Progress Notes (Signed)
S: C/o sinus pressure, drainage,  and congestion for 3 days, no fever, chills, cp/sob, v/d; c/o of facial and dental pain.   Using otc meds: sudafed, naproxen  O: PE: vitals wnl, nad, perrl eomi, normocephalic, tms dull, nasal mucosa red and swollen worse on left side, throat injected, neck supple no lymph, lungs c t a, cv rrr, neuro intact  A:  Acute sinusitis   P: drink fluids, continue regular meds , use otc meds of choice, return if not improving in 5 days, return earlier if worsening , zpack, flonase

## 2017-01-04 ENCOUNTER — Other Ambulatory Visit: Payer: Self-pay | Admitting: Obstetrics and Gynecology

## 2017-01-21 ENCOUNTER — Other Ambulatory Visit
Admission: RE | Admit: 2017-01-21 | Discharge: 2017-01-21 | Disposition: A | Discharge status: Home / Self Care | Payer: 59 | Source: Ambulatory Visit | Attending: Registered Nurse | Admitting: Registered Nurse

## 2017-01-21 ENCOUNTER — Ambulatory Visit: Payer: Self-pay | Admitting: Registered Nurse

## 2017-01-21 VITALS — BP 122/65 | HR 66 | Temp 98.9°F | Resp 16

## 2017-01-21 DIAGNOSIS — N390 Urinary tract infection, site not specified: Secondary | ICD-10-CM | POA: Diagnosis not present

## 2017-01-21 DIAGNOSIS — Z862 Personal history of diseases of the blood and blood-forming organs and certain disorders involving the immune mechanism: Secondary | ICD-10-CM

## 2017-01-21 DIAGNOSIS — H9311 Tinnitus, right ear: Secondary | ICD-10-CM

## 2017-01-21 DIAGNOSIS — H8111 Benign paroxysmal vertigo, right ear: Secondary | ICD-10-CM

## 2017-01-21 LAB — CBC WITH DIFFERENTIAL/PLATELET
BASOS PCT: 0 %
Basophils Absolute: 0 10*3/uL (ref 0–0.1)
EOS ABS: 0.1 10*3/uL (ref 0–0.7)
EOS PCT: 2 %
HCT: 35.7 % (ref 35.0–47.0)
Hemoglobin: 12.4 g/dL (ref 12.0–16.0)
LYMPHS ABS: 1.1 10*3/uL (ref 1.0–3.6)
Lymphocytes Relative: 23 %
MCH: 32.2 pg (ref 26.0–34.0)
MCHC: 34.8 g/dL (ref 32.0–36.0)
MCV: 92.6 fL (ref 80.0–100.0)
MONOS PCT: 8 %
Monocytes Absolute: 0.4 10*3/uL (ref 0.2–0.9)
Neutro Abs: 3.3 10*3/uL (ref 1.4–6.5)
Neutrophils Relative %: 67 %
PLATELETS: 205 10*3/uL (ref 150–440)
RBC: 3.86 MIL/uL (ref 3.80–5.20)
RDW: 13.4 % (ref 11.5–14.5)
WBC: 4.9 10*3/uL (ref 3.6–11.0)

## 2017-01-21 LAB — IRON: IRON: 49 ug/dL (ref 28–170)

## 2017-01-21 MED ORDER — SALINE SPRAY 0.65 % NA SOLN: 2.0000 | NASAL | 0 refills | Status: DC

## 2017-01-21 MED ORDER — MECLIZINE HCL 25 MG PO TABS: 25.0000 mg | ORAL_TABLET | Freq: Four times a day (QID) | ORAL | 0 refills | Status: AC | PRN

## 2017-01-21 MED ORDER — WOMENS MULTIVITAMIN PO TABS: 1.0000 | ORAL_TABLET | Freq: Every day | ORAL | Status: DC

## 2017-01-21 NOTE — Progress Notes (Signed)
Subjective:    Patient ID: Stephanie Chambers, female    DOB: Mar 02, 1976, 41 y.o.   MRN: [id number]  41y/o caucasian female here for ear ringing right only especially at night lying in bed; at work today dizzyness, lip tingling/numbness that resolved after a couple minutes.  Has meclizine at home for vertigo but hasn't used this week because came on suddenly at work today.  PMHx iron anemia hasn't been tested since 2015 not taking daily vitamins. Reports heavy menstrual flow monthly due to start any day now again; Works in [employer] [place] [employer].  Patient reported ate normally and hydrated today peeing normally.  BPs normal when her coworkers checked it for her at time of dizzyness at work.      Review of Systems  Constitutional: Negative for activity change, appetite change, chills, diaphoresis, fatigue, fever and unexpected weight change.  HENT: Positive for ear pain and tinnitus. Negative for congestion, ear discharge, facial swelling, hearing loss, mouth sores, nosebleeds, sore throat, trouble swallowing and voice change.   Eyes: Negative for photophobia and visual disturbance.  Respiratory: Negative for cough, chest tightness, shortness of breath, wheezing and stridor.   Cardiovascular: Negative for chest pain, palpitations and leg swelling.  Gastrointestinal: Negative for diarrhea and vomiting.  Endocrine: Negative for cold intolerance and heat intolerance.  Genitourinary: Negative for difficulty urinating and dysuria.  Musculoskeletal: Negative for arthralgias, back pain, gait problem, joint swelling, myalgias, neck pain and neck stiffness.  Allergic/Immunologic: Positive for environmental allergies. Negative for food allergies.  Neurological: Positive for dizziness, light-headedness and numbness. Negative for tremors, seizures, syncope, facial asymmetry, speech difficulty, weakness and headaches.  Hematological: Negative for adenopathy. Does not bruise/bleed easily.  Psychiatric/Behavioral:  Negative for confusion and sleep disturbance.       Objective:   Physical Exam  Constitutional: She is oriented to person, place, and time. Vital signs are normal. She appears well-developed and well-nourished. She is active and cooperative.  Non-toxic appearance. She does not have a sickly appearance. She does not appear ill. No distress.  HENT:  Head: Normocephalic and atraumatic.  Right Ear: Hearing, external ear and ear canal normal. A middle ear effusion is present.  Left Ear: Hearing, external ear and ear canal normal. A middle ear effusion is present.  Nose: Mucosal edema and rhinorrhea present. No nose lacerations, sinus tenderness, nasal deformity, septal deviation or nasal septal hematoma. No epistaxis.  No foreign bodies. Right sinus exhibits no maxillary sinus tenderness and no frontal sinus tenderness. Left sinus exhibits no maxillary sinus tenderness and no frontal sinus tenderness.  Mouth/Throat: Uvula is midline and mucous membranes are normal. Mucous membranes are not pale, not dry and not cyanotic. She does not have dentures. No oral lesions. No trismus in the jaw. Normal dentition. No dental abscesses, uvula swelling, lacerations or dental caries. Posterior oropharyngeal edema and posterior oropharyngeal erythema present. No oropharyngeal exudate or tonsillar abscesses.  Oropharynx maculopapular erythema/edema; cobblestoning posterior pharynx; bilateral TMs air fluid level clear; bilateral allergic shiners; bilateral nasal turbinates edema/erythema/clear discharge  Eyes: Pupils are equal, round, and reactive to light. Conjunctivae, EOM and lids are normal. Right eye exhibits no chemosis, no discharge, no exudate and no hordeolum. No foreign body present in the right eye. Left eye exhibits no chemosis, no discharge, no exudate and no hordeolum. No foreign body present in the left eye. Right conjunctiva is not injected. Right conjunctiva has no hemorrhage. Left conjunctiva is not  injected. Left conjunctiva has no hemorrhage. No scleral icterus. Right eye exhibits  normal extraocular motion and no nystagmus. Left eye exhibits normal extraocular motion and no nystagmus. Right pupil is round and reactive. Left pupil is round and reactive. Pupils are equal.  Neck: Trachea normal, normal range of motion and phonation normal. Neck supple. No tracheal tenderness and no muscular tenderness present. No neck rigidity. No tracheal deviation, no edema, no erythema and normal range of motion present. No thyroid mass and no thyromegaly present.  Cardiovascular: Normal rate, regular rhythm, S1 normal, S2 normal, normal heart sounds and intact distal pulses.  PMI is not displaced.  Exam reveals no gallop and no friction rub.   No murmur heard. Pulmonary/Chest: Effort normal and breath sounds normal. No accessory muscle usage or stridor. No respiratory distress. She has no decreased breath sounds. She has no wheezes. She has no rhonchi. She has no rales. She exhibits no tenderness.  Abdominal: Normal appearance. She exhibits no distension, no fluid wave and no ascites. There is no rigidity and no guarding.  Musculoskeletal: Normal range of motion. She exhibits no edema or tenderness.       Right shoulder: Normal.       Left shoulder: Normal.       Right elbow: Normal.      Left elbow: Normal.       Right hip: Normal.       Left hip: Normal.       Right knee: Normal.       Left knee: Normal.       Cervical back: Normal.       Thoracic back: Normal.       Lumbar back: Normal.       Right hand: Normal.       Left hand: Normal.  Lymphadenopathy:       Head (right side): No submental, no submandibular, no tonsillar, no preauricular, no posterior auricular and no occipital adenopathy present.       Head (left side): No submental, no submandibular, no tonsillar, no preauricular, no posterior auricular and no occipital adenopathy present.    She has no cervical adenopathy.       Right  cervical: No superficial cervical, no deep cervical and no posterior cervical adenopathy present.      Left cervical: No superficial cervical, no deep cervical and no posterior cervical adenopathy present.  Neurological: She is alert and oriented to person, place, and time. She has normal strength. She is not disoriented. She displays no atrophy and no tremor. No cranial nerve deficit or sensory deficit. She exhibits normal muscle tone. She displays no seizure activity. Coordination and gait normal. GCS eye subscore is 4. GCS verbal subscore is 5. GCS motor subscore is 6.  On/off exam table without difficulty; gait sure and steady in hall  Skin: Skin is warm, dry and intact. No abrasion, no bruising, no burn, no ecchymosis, no laceration, no lesion, no petechiae and no rash noted. She is not diaphoretic. No cyanosis or erythema. No pallor. Nails show no clubbing.  Psychiatric: She has a normal mood and affect. Her speech is normal and behavior is normal. Judgment and thought content normal. Cognition and memory are normal.  Nursing note and vitals reviewed.         Assessment & Plan:  A-tinnitus, history iron deficiency anemia, vertigo  P-Discussed with patient otitis media with effusion probably causing vertigo.  Meclizine 25mg  po QID prn has been helpful in the past and patient has at home.  Restart flonase 2 sprays each nostril daily at  home.  Consider starting nasal saline 2 spray each nostril q2h wa.  meclizine per day max 100mg  per 24 hours.  Discussed may cause drowsiness.  Follow up if aphasia, dysphasia, visual changes, weakness, fall, worst headache of life, incoordination, fever, ear discharge.  Consider ENT evaluation/follow up with PCM if worsening symptoms not controlled with meclizine.  Exitcare handout given on benign positional vertigo.  Patient verbalized understanding of information/agreed with plan of care and had no further questions at this time.  Tinnitus handout given to  patient exitcare.  Discussed anemia and/or otitis media effusion could be causing tinnitus.  CBC and iron level to be drawn patient given paperwork and discussed it would be billed to her insurance unsure of copay she is to check with her insurance as must go over to hospital and have drawn for [facility] not done in our clinic.  Consider starting women's one a day multivitamin with iron as history of anemia and patient reporting heaving menstrual flow.  Supportive treatment.   No evidence of invasive bacterial infection, non toxic and well hydrated.  This is most likely self limiting viral infection.  I do not see where any further testing or imaging is necessary at this time.   I will suggest supportive care, rest, good hygiene and encourage the patient to take adequate fluids.  The patient is to return to clinic or EMERGENCY ROOM if symptoms worsen or change significantly e.g. ear pain, fever, purulent discharge from ears or bleeding. Patient verbalized agreement and understanding of treatment plan and had no further questions at this time.

## 2017-01-21 NOTE — Patient Instructions (Signed)
Benign Positional Vertigo Vertigo is the feeling that you or your surroundings are moving when they are not. Benign positional vertigo is the most common form of vertigo. The cause of this condition is not serious (is benign). This condition is triggered by certain movements and positions (is positional). This condition can be dangerous if it occurs while you are doing something that could endanger you or others, such as driving. What are the causes? In many cases, the cause of this condition is not known. It may be caused by a disturbance in an area of the inner ear that helps your brain to sense movement and balance. This disturbance can be caused by a viral infection (labyrinthitis), head injury, or repetitive motion. What increases the risk? This condition is more likely to develop in:  Women.  People who are 50 years of age or older.  What are the signs or symptoms? Symptoms of this condition usually happen when you move your head or your eyes in different directions. Symptoms may start suddenly, and they usually last for less than a minute. Symptoms may include:  Loss of balance and falling.  Feeling like you are spinning or moving.  Feeling like your surroundings are spinning or moving.  Nausea and vomiting.  Blurred vision.  Dizziness.  Involuntary eye movement (nystagmus).  Symptoms can be mild and cause only slight annoyance, or they can be severe and interfere with daily life. Episodes of benign positional vertigo may return (recur) over time, and they may be triggered by certain movements. Symptoms may improve over time. How is this diagnosed? This condition is usually diagnosed by medical history and a physical exam of the head, neck, and ears. You may be referred to a health care provider who specializes in ear, nose, and throat (ENT) problems (otolaryngologist) or a provider who specializes in disorders of the nervous system (neurologist). You may have additional testing,  including:  MRI.  A CT scan.  Eye movement tests. Your health care provider may ask you to change positions quickly while he or she watches you for symptoms of benign positional vertigo, such as nystagmus. Eye movement may be tested with an electronystagmogram (ENG), caloric stimulation, the Dix-Hallpike test, or the roll test.  An electroencephalogram (EEG). This records electrical activity in your brain.  Hearing tests.  How is this treated? Usually, your health care provider will treat this by moving your head in specific positions to adjust your inner ear back to normal. Surgery may be needed in severe cases, but this is rare. In some cases, benign positional vertigo may resolve on its own in 2-4 weeks. Follow these instructions at home: Safety  Move slowly.Avoid sudden body or head movements.  Avoid driving.  Avoid operating heavy machinery.  Avoid doing any tasks that would be dangerous to you or others if a vertigo episode would occur.  If you have trouble walking or keeping your balance, try using a cane for stability. If you feel dizzy or unstable, sit down right away.  Return to your normal activities as told by your health care provider. Ask your health care provider what activities are safe for you. General instructions  Take over-the-counter and prescription medicines only as told by your health care provider.  Avoid certain positions or movements as told by your health care provider.  Drink enough fluid to keep your urine clear or pale yellow.  Keep all follow-up visits as told by your health care provider. This is important. Contact a health care   provider if:  You have a fever.  Your condition gets worse or you develop new symptoms.  Your family or friends notice any behavioral changes.  Your nausea or vomiting gets worse.  You have numbness or a "pins and needles" sensation. Get help right away if:  You have difficulty speaking or moving.  You are  always dizzy.  You faint.  You develop severe headaches.  You have weakness in your legs or arms.  You have changes in your hearing or vision.  You develop a stiff neck.  You develop sensitivity to light. This information is not intended to replace advice given to you by your health care provider. Make sure you discuss any questions you have with your health care provider. Document Released: 01/11/2006 Document Revised: 09/11/2015 Document Reviewed: 07/29/2014 Elsevier Interactive Patient Education  2018 [employer]. Tinnitus Tinnitus refers to hearing a sound when there is no actual source for that sound. This is often described as ringing in the ears. However, people with this condition may hear a variety of noises. A person may hear the sound in one ear or in both ears. The sounds of tinnitus can be soft, loud, or somewhere in between. Tinnitus can last for a few seconds or can be constant for days. It may go away without treatment and come back at various times. When tinnitus is constant or happens often, it can lead to other problems, such as trouble sleeping and trouble concentrating. Almost everyone experiences tinnitus at some point. Tinnitus that is long-lasting (chronic) or comes back often is a problem that may require medical attention. What are the causes? The cause of tinnitus is often not known. In some cases, it can result from other problems or conditions, including:  Exposure to loud noises from machinery, music, or other sources.  Hearing loss.  Ear or sinus infections.  Earwax buildup.  A foreign object in the ear.  Use of certain medicines.  Use of alcohol and caffeine.  High blood pressure.  Heart diseases.  Anemia.  Allergies.  Meniere disease.  Thyroid problems.  Tumors.  An enlarged part of a weakened blood vessel (aneurysm).  What are the signs or symptoms? The main symptom of tinnitus is hearing a sound when there is no source for  that sound. It may sound like:  Buzzing.  Roaring.  Ringing.  Blowing air, similar to the sound heard when you listen to a seashell.  Hissing.  Whistling.  Sizzling.  Humming.  Running water.  A sustained musical note.  How is this diagnosed? Tinnitus is diagnosed based on your symptoms. Your health care provider will do a physical exam. A comprehensive hearing exam (audiologic exam) will be done if your tinnitus:  Affects only one ear (unilateral).  Causes hearing difficulties.  Lasts 6 months or longer.  You may also need to see a health care provider who specializes in hearing disorders (audiologist). You may be asked to complete a questionnaire to determine the severity of your tinnitus. Tests may be done to help determine the cause and to rule out other conditions. These can include:  Imaging studies of your head and brain, such as: ? A CT scan. ? An MRI.  An imaging study of your blood vessels (angiogram).  How is this treated? Treating an underlying medical condition can sometimes make tinnitus go away. If your tinnitus continues, other treatments may include:  Medicines, such as certain antidepressants or sleeping aids.  Sound generators to mask the tinnitus. These include: ?  Tabletop sound machines that play relaxing sounds to help you fall asleep. ? Wearable devices that fit in your ear and play sounds or music. ? A small device that uses headphones to deliver a signal embedded in music (acoustic neural stimulation). In time, this may change the pathways of your brain and make you less sensitive to tinnitus. This device is used for very severe cases when no other treatment is working.  Therapy and counseling to help you manage the stress of living with tinnitus.  Using hearing aids or cochlear implants, if your tinnitus is related to hearing loss.  Follow these instructions at home:  When possible, avoid being in loud places and being exposed to loud  sounds.  Wear hearing protection, such as earplugs, when you are exposed to loud noises.  Do not take stimulants, such as nicotine, alcohol, or caffeine.  Practice techniques for reducing stress, such as meditation, yoga, or deep breathing.  Use a white noise machine, a humidifier, or other devices to mask the sound of tinnitus.  Sleep with your head slightly raised. This may reduce the impact of tinnitus.  Try to get plenty of rest each night. Contact a health care provider if:  You have tinnitus in just one ear.  Your tinnitus continues for 3 weeks or longer without stopping.  Home care measures are not helping.  You have tinnitus after a head injury.  You have tinnitus along with any of the following: ? Dizziness. ? Loss of balance. ? Nausea and vomiting. This information is not intended to replace advice given to you by your health care provider. Make sure you discuss any questions you have with your health care provider. Document Released: 04/05/2005 Document Revised: 12/07/2015 Document Reviewed: 09/05/2013 Elsevier Interactive Patient Education  2018 [employer]. Iron Deficiency Anemia, Adult Iron deficiency anemia is a condition in which the concentration of red blood cells or hemoglobin in the blood is below normal because of too little iron. Hemoglobin is a substance in red blood cells that carries oxygen to the body's tissues. When the concentration of red blood cells or hemoglobin is too low, not enough oxygen reaches these tissues. Iron deficiency anemia is usually long-lasting (chronic) and it develops over time. It may or may not cause symptoms. It is a common type of anemia. What are the causes? This condition may be caused by:  Not enough iron in the diet.  Blood loss caused by bleeding in the intestine.  Blood loss from a gastrointestinal condition like Crohn disease.  Frequent blood draws, such as from blood donation.  Abnormal absorption in the  gut.  Heavy menstrual periods in women.  Cancers of the gastrointestinal system, such as colon cancer.  What are the signs or symptoms? Symptoms of this condition may include:  Fatigue.  Headache.  Pale skin, lips, and nail beds.  Poor appetite.  Weakness.  Shortness of breath.  Dizziness.  Cold hands and feet.  Fast or irregular heartbeat.  Irritability. This is more common in severe anemia.  Rapid breathing. This is more common in severe anemia.  Mild anemia may not cause any symptoms. How is this diagnosed? This condition is diagnosed based on:  Your medical history.  A physical exam.  Blood tests.  You may have additional tests to find the underlying cause of your anemia, such as:  Testing for blood in the stool (fecal occult blood test).  A procedure to see inside your colon and rectum (colonoscopy).  A procedure to  see inside your esophagus and stomach (endoscopy).  A test in which cells are removed from bone marrow (bone marrow aspiration) or fluid is removed from the bone marrow to be examined (biopsy). This is rarely needed.  How is this treated? This condition is treated by correcting the cause of your iron deficiency. Treatment may involve:  Adding iron-rich foods to your diet.  Taking iron supplements. If you are pregnant or breastfeeding, you may need to take extra iron because your normal diet usually does not provide the amount of iron that you need.  Increasing vitamin C intake. Vitamin C helps your body absorb iron. Your health care provider may recommend that you take iron supplements along with a glass of orange juice or a vitamin C supplement.  Medicines to make heavy menstrual flow lighter.  Surgery.  You may need repeat blood tests to determine whether treatment is working. Depending on the underlying cause, the anemia should be corrected within 2 months of starting treatment. If the treatment does not seem to be working, you may  need more testing. Follow these instructions at home: Medicines  Take over-the-counter and prescription medicines only as told by your health care provider. This includes iron supplements and vitamins.  If you cannot tolerate taking iron supplements by mouth, talk with your health care provider about taking them through a vein (intravenously) or an injection into a muscle.  For the best iron absorption, you should take iron supplements when your stomach is empty. If you cannot tolerate them on an empty stomach, you may need to take them with food.  Do not drink milk or take antacids at the same time as your iron supplements. Milk and antacids may interfere with iron absorption.  Iron supplements can cause constipation. To prevent constipation, include fiber in your diet as told by your health care provider. A stool softener may also be recommended. Eating and drinking  Talk with your health care provider before changing your diet. He or she may recommend that you eat foods that contain a lot of iron, such as: ? Liver. ? Low-fat (lean) beef. ? Breads and cereals that have iron added to them (are fortified). ? Eggs. ? Dried fruit. ? Dark green, leafy vegetables.  To help your body use the iron from iron-rich foods, eat those foods at the same time as fresh fruits and vegetables that are high in vitamin C. Foods that are high in vitamin C include: ? Oranges. ? Peppers. ? Tomatoes. ? Mangoes.  Drinkenoughfluid to keep your urine clear or pale yellow. General instructions  Return to your normal activities as told by your health care provider. Ask your health care provider what activities are safe for you.  Practice good hygiene. Anemia can make you more prone to illness and infection.  Keep all follow-up visits as told by your health care provider. This is important. Contact a health care provider if:  You feel nauseous or you vomit.  You feel weak.  You have unexplained  sweating.  You develop symptoms of constipation, such as: ? Having fewer than three bowel movements a week. ? Straining to have a bowel movement. ? Having stools that are hard, dry, or larger than normal. ? Feeling full or bloated. ? Pain in the lower abdomen. ? Not feeling relief after having a bowel movement. Get help right away if:  You faint. If this happens, do not drive yourself to the hospital. Call your local emergency services (911 in the [place]  You have chest pain.  You have shortness of breath that: ? Is severe. ? Gets worse with physical activity.  You have a rapid heartbeat.  You become light-headed when getting up from a sitting or lying down position. This information is not intended to replace advice given to you by your health care provider. Make sure you discuss any questions you have with your health care provider. Document Released: 04/02/2000 Document Revised: 12/24/2015 Document Reviewed: 12/24/2015 Elsevier Interactive Patient Education  2018 [employer].

## 2017-03-04 DIAGNOSIS — H52223 Regular astigmatism, bilateral: Secondary | ICD-10-CM | POA: Diagnosis not present

## 2017-03-16 ENCOUNTER — Ambulatory Visit: Payer: Self-pay | Admitting: Physician Assistant

## 2017-03-17 ENCOUNTER — Ambulatory Visit: Payer: Self-pay | Admitting: Family

## 2017-03-17 ENCOUNTER — Encounter: Payer: Self-pay | Admitting: Family

## 2017-03-17 VITALS — BP 100/70 | HR 79 | Temp 98.4°F

## 2017-03-17 DIAGNOSIS — J019 Acute sinusitis, unspecified: Secondary | ICD-10-CM

## 2017-03-17 MED ORDER — FLUTICASONE PROPIONATE 50 MCG/ACT NA SUSP: 2.0000 | Freq: Every day | NASAL | 1 refills | Status: DC

## 2017-03-17 MED ORDER — AZITHROMYCIN 250 MG PO TABS: ORAL_TABLET | ORAL | 0 refills | Status: DC

## 2017-03-17 NOTE — Progress Notes (Signed)
S: Cough and congestion for a week and a half nail with localized sinus pain and pressure, blowing green, malaise not responding to OTC meds, history of allergies, takes Claritin  O: Vital signs stable alert pleasant no acute distress ENT: tms clear, nasal mucosa erythematous swollen excoriated with purulent rhinorrhea some maxillary tenderness to palpation pharynx clear neck supple nontender heart RSR lungs are clear  A: Acute rhinosinusitis  P: Rx Z-Pak, refill of Flonase, Nasal saline products bid and prn , Supportive measures discussed. Follow up prn not improving

## 2017-04-07 ENCOUNTER — Encounter: Payer: Self-pay | Admitting: Physician Assistant

## 2017-04-07 ENCOUNTER — Ambulatory Visit: Payer: Self-pay | Admitting: Physician Assistant

## 2017-04-07 ENCOUNTER — Telehealth: Payer: Self-pay | Admitting: Obstetrics and Gynecology

## 2017-04-07 ENCOUNTER — Telehealth: Payer: Self-pay

## 2017-04-07 VITALS — BP 100/60 | HR 85 | Temp 98.5°F | Resp 16

## 2017-04-07 DIAGNOSIS — E0590 Thyrotoxicosis, unspecified without thyrotoxic crisis or storm: Secondary | ICD-10-CM

## 2017-04-07 NOTE — Telephone Encounter (Signed)
Copied from [place]. Topic: Appointment Scheduling - New Patient >> Apr 07, 2017 10:20 AM Yvette Rack wrote: New patient has been scheduled for your office. Provider: McLean-scocuzza Date of Appointment: 05-06-17  Route to department's PEC pool.

## 2017-04-07 NOTE — Progress Notes (Signed)
   Subjective: Hypothyroidism     Patient ID: Stephanie Chambers, female    DOB: April 29, 1975, 41 y.o.   MRN: [id number]  HPI Patient requests evaluation for suspected thyroid issues. Patient has strong family history of hypothyroidism.. Patient reporting malaise, had thinning, palpitation and feeling anxious. Patient did not have a primary doctor. Patient currently taking medication for anxiety.  Review of Systems Malaise, had thinning, palpitation, and anxiety.    Objective:   Physical Exam  Physical exam deferred.       Assessment & Plan:    Patient will be consulted to [facility] for definitive evaluation and testing.

## 2017-04-07 NOTE — Telephone Encounter (Signed)
Patient called and wanted to discuss a few symptoms she is having. She thinks she may need some labwork. You can return her call at [phone]. Thanks

## 2017-04-08 NOTE — Telephone Encounter (Signed)
Pt would like her thyroid checked. Her hair has been falling out, change in bm., shorter cycles, Hrt palpitation, brain fog- appt made for 04/27/2016 at 1Pm.

## 2017-04-27 ENCOUNTER — Encounter: Payer: Self-pay | Admitting: Obstetrics and Gynecology

## 2017-04-27 ENCOUNTER — Ambulatory Visit (INDEPENDENT_AMBULATORY_CARE_PROVIDER_SITE_OTHER): Payer: 59 | Admitting: Obstetrics and Gynecology

## 2017-04-27 VITALS — BP 103/63 | HR 82 | Ht 66.5 in | Wt 146.4 lb

## 2017-04-27 DIAGNOSIS — R1011 Right upper quadrant pain: Secondary | ICD-10-CM | POA: Diagnosis not present

## 2017-04-27 DIAGNOSIS — Z800 Family history of malignant neoplasm of digestive organs: Secondary | ICD-10-CM | POA: Diagnosis not present

## 2017-04-27 DIAGNOSIS — M2550 Pain in unspecified joint: Secondary | ICD-10-CM | POA: Diagnosis not present

## 2017-04-27 DIAGNOSIS — L659 Nonscarring hair loss, unspecified: Secondary | ICD-10-CM | POA: Diagnosis not present

## 2017-04-27 DIAGNOSIS — R5383 Other fatigue: Secondary | ICD-10-CM | POA: Diagnosis not present

## 2017-04-27 DIAGNOSIS — Z803 Family history of malignant neoplasm of breast: Secondary | ICD-10-CM | POA: Diagnosis not present

## 2017-04-27 NOTE — Patient Instructions (Signed)
1.  Thyroid panel is ordered 2.  Hepatic profile is ordered 3.  Consider genetic testing for breast cancer/ovarian cancer/colon cancer.  Literature is given regarding [facility] testing 4.  Consider referral to dermatology if lab work is normal-regarding hair loss

## 2017-04-27 NOTE — Progress Notes (Signed)
Chief complaint: 1.  Hair loss 2.  Palpitations 3.  Fatigue 4.  Multiple joint achiness 5.  Family history of breast cancer 6.  Family history of colon cancer  Stephanie Chambers presents today for above issues.  She has noted they are coming out in clumps.  She has had increased fatigue as well as intermittent heart palpitations.  Symptoms have been ongoing for approximately 4 months.  Other additional complaints include some fatigue and multiple joint aches. Mom does have a diagnosis of breast cancer made at age 81 in 2011. Dad does have history of colon cancer, as does a maternal aunt. She has not had genetic testing for breast cancer, ovarian cancer, colon cancer.  Past medical history, past surgical history, problem list, medications, and allergies are reviewed  OBJECTIVE: BP 103/63   Pulse 82   Ht 5' 6.5" (1.689 m)   Wt 146 lb 6.4 oz (66.4 kg)   LMP 04/22/2017 (Exact Date)   BMI 23.28 kg/m  Pleasant well-appearing female in no acute distress.  Alert and oriented. HEENT full head of hair is noted without obvious hair loss; no exophthalmos; no lid lag or nystagmus Neck: Supple without thyromegaly or adenopathy Abdomen: Soft, nontender, nondistended; no organomegaly; no Murphy's sign. Skin: Warm and dry without scaling or rash Extremities: No edema  ASSESSMENT: 1.  Hair loss 2.  Palpitations 3.  Fatigue 4.  Multiple joint achiness 5.  Family history of breast cancer 6.  Family history of colon cancer 7.  Normal exam today  PLAN: 1.  Thyroid panel 2.  Hepatic profile 3.  Consider dermatology referral for hair loss 4.  Consider genetic testing for breast cancer/ovarian cancer/colon cancer  A total of 15 minutes were spent face-to-face with the patient during this encounter and over half of that time dealt with counseling and coordination of care.  Brayton Mars, MD  Note: This dictation was prepared with Dragon dictation along with smaller phrase technology. Any  transcriptional errors that result from this process are unintentional.

## 2017-04-28 LAB — THYROID PANEL WITH TSH
Free Thyroxine Index: 1.7 (ref 1.2–4.9)
T3 Uptake Ratio: 25 % (ref 24–39)
T4 TOTAL: 6.8 ug/dL (ref 4.5–12.0)
TSH: 1.88 u[IU]/mL (ref 0.450–4.500)

## 2017-04-28 LAB — HEPATIC FUNCTION PANEL
ALK PHOS: 53 IU/L (ref 39–117)
ALT: 12 IU/L (ref 0–32)
AST: 15 IU/L (ref 0–40)
Albumin: 4.7 g/dL (ref 3.5–5.5)
Bilirubin Total: 0.2 mg/dL (ref 0.0–1.2)
Bilirubin, Direct: 0.08 mg/dL (ref 0.00–0.40)
Total Protein: 7 g/dL (ref 6.0–8.5)

## 2017-05-06 ENCOUNTER — Ambulatory Visit: Payer: Self-pay | Admitting: Internal Medicine

## 2017-07-15 ENCOUNTER — Emergency Department
Admission: EM | Admit: 2017-07-15 | Discharge: 2017-07-15 | Disposition: A | Discharge status: Home / Self Care | Payer: 59 | Attending: Emergency Medicine | Admitting: Emergency Medicine

## 2017-07-15 ENCOUNTER — Emergency Department: Payer: 59

## 2017-07-15 DIAGNOSIS — M6281 Muscle weakness (generalized): Secondary | ICD-10-CM | POA: Diagnosis not present

## 2017-07-15 DIAGNOSIS — R42 Dizziness and giddiness: Secondary | ICD-10-CM | POA: Diagnosis not present

## 2017-07-15 DIAGNOSIS — Z5321 Procedure and treatment not carried out due to patient leaving prior to being seen by health care provider: Secondary | ICD-10-CM | POA: Insufficient documentation

## 2017-07-15 DIAGNOSIS — R0602 Shortness of breath: Secondary | ICD-10-CM | POA: Insufficient documentation

## 2017-07-15 DIAGNOSIS — R079 Chest pain, unspecified: Secondary | ICD-10-CM | POA: Diagnosis not present

## 2017-07-15 DIAGNOSIS — R0789 Other chest pain: Secondary | ICD-10-CM | POA: Diagnosis not present

## 2017-07-15 LAB — BASIC METABOLIC PANEL
Anion gap: 8 (ref 5–15)
BUN: 17 mg/dL (ref 6–20)
CHLORIDE: 107 mmol/L (ref 101–111)
CO2: 24 mmol/L (ref 22–32)
CREATININE: 0.95 mg/dL (ref 0.44–1.00)
Calcium: 9.2 mg/dL (ref 8.9–10.3)
GFR calc Af Amer: >60 mL/min (ref >60)
GFR calc non Af Amer: >60 mL/min (ref >60)
Glucose, Bld: 98 mg/dL (ref 65–99)
Potassium: 3.7 mmol/L (ref 3.5–5.1)
SODIUM: 139 mmol/L (ref 135–145)

## 2017-07-15 LAB — CBC
HCT: 36.6 % (ref 35.0–47.0)
Hemoglobin: 12.4 g/dL (ref 12.0–16.0)
MCH: 31 pg (ref 26.0–34.0)
MCHC: 33.8 g/dL (ref 32.0–36.0)
MCV: 91.7 fL (ref 80.0–100.0)
PLATELETS: 207 10*3/uL (ref 150–440)
RBC: 3.99 MIL/uL (ref 3.80–5.20)
RDW: 14.2 % (ref 11.5–14.5)
WBC: 5.3 10*3/uL (ref 3.6–11.0)

## 2017-07-15 LAB — TROPONIN I: Troponin I: <0.03 ng/mL (ref <0.03)

## 2017-07-15 NOTE — ED Triage Notes (Signed)
Patient c/o left chest pain radiating down arm beginning this am. Patient reports severity increases with deep inspiration. Patient describes the pain as spasm/stabbing. Patient reports dizziness, weakness, and SOB with onset of pain.

## 2017-07-15 NOTE — ED Notes (Signed)
Informed RN that patient has been roomed and is ready for evaluation.  Patient in NAD at this time and call bell placed within reach.   

## 2017-07-15 NOTE — ED Notes (Signed)
MD went to tend to patient and they had left AMA without notifying staff.

## 2017-07-19 ENCOUNTER — Ambulatory Visit: Payer: 59 | Admitting: Cardiovascular Disease

## 2017-07-19 ENCOUNTER — Encounter: Payer: Self-pay | Admitting: Cardiovascular Disease

## 2017-07-19 VITALS — BP 100/58 | HR 50 | Ht 66.0 in | Wt 140.5 lb

## 2017-07-19 DIAGNOSIS — R079 Chest pain, unspecified: Secondary | ICD-10-CM

## 2017-07-19 NOTE — Patient Instructions (Signed)
Medication Instructions: No change.   Labwork: None.   Procedures/Testing: None.   Follow-Up: As needed with Dr. Arida.   Any Additional Special Instructions Will Be Listed Below (If Applicable).     If you need a refill on your cardiac medications before your next appointment, please call your pharmacy.   

## 2017-07-19 NOTE — Progress Notes (Signed)
Cardiology Office Note   Date:  07/19/2017   ID:  Stephanie Chambers, DOB 12/04/1975, MRN [id number]  PCP:  Versie Starks, PA-C  Cardiologist:   Kathlyn Sacramento, MD   Chief Complaint  Patient presents with  . other    Self ref for chest pain, neck & left arm pain with tingling and shortness of breath. Meds reviewed by the pt. verbally.       History of Present Illness: Stephanie Chambers is a 42 y.o. female who is self-referred for evaluation of chest pain.  She works at [employer].  She has been healthy throughout her life with no chronic medical conditions.  She smoked in her 60s but not recently.  Her mother has atrial fibrillation.  Her father has known history of coronary artery disease status post CABG. On Friday while she was at work sitting, she had sudden onset of left-sided chest pain which was localized.  It was described as stabbing severe discomfort that lasted for few hours.  It was associated with shortness of breath.  She went to urgent care for evaluation and was told her EKG was abnormal and thus was sent to the emergency room.  EKG showed no ischemic changes.  There was possible left atrial enlargement.  Troponin was negative and chest x-ray was normal.  She had no recurrent symptoms since then.  She feels back to her normal self. She exercises regularly and runs 2-1/2 miles at least 5 times a week. She has been under increased stress stress lately due to terminal illness of her mother currently in hospice.   Past Medical History:  Diagnosis Date  . Anemia   . Anxiety   . Bulging of cervical intervertebral disc   . Constipation   . Depression    major  . DUB (dysfunctional uterine bleeding)   . Endometrial polyp 05/2012   hysteroscopy w/ d &C  . Epigastric abdominal pain   . Family history of breast cancer in mother   . Lump in female breast   . Mastodynia   . Menorrhagia   . UTI (lower urinary tract infection)     Past Surgical History:    Procedure Laterality Date  . DIAGNOSTIC LAPAROSCOPY  2004  . DILATION AND CURETTAGE OF UTERUS    . HYSTEROSCOPY    . TONSILLECTOMY  1998     Current Outpatient Medications  Medication Sig Dispense Refill  . BIOTIN PO Take by mouth.    Marland Kitchen buPROPion (WELLBUTRIN SR) 150 MG 12 hr tablet TAKE 1 TABLET BY MOUTH TWO TIMES DAILY 120 tablet 6  . fluticasone (FLONASE) 50 MCG/ACT nasal spray Place 2 sprays into both nostrils daily. 16 g 1  . sodium chloride (OCEAN) 0.65 % SOLN nasal spray Place 2 sprays into both nostrils every 2 (two) hours while awake.  0   No current facility-administered medications for this visit.     Allergies:   Other    Social History:  The patient  reports that she has quit smoking. She has never used smokeless tobacco. She reports that she drinks alcohol. She reports that she does not use drugs.   Family History:  The patient's family history includes Breast cancer in her cousin and mother; Cancer in her maternal grandmother; Cancer (age of onset: 50) in her mother; Cancer (age of onset: 66) in her father; Depression in her maternal grandmother and paternal grandmother; Heart disease in her father and paternal grandfather.    ROS:  Please see the history of present illness.   Otherwise, review of systems are positive for none.   All other systems are reviewed and negative.    PHYSICAL EXAM: VS:  BP (!) 100/58 (BP Location: Right Arm, Patient Position: Sitting, Cuff Size: Normal)   Pulse (!) 50   Ht 5\' 6"  (1.676 m)   Wt 140 lb 8 oz (63.7 kg)   BMI 22.68 kg/m  , BMI Body mass index is 22.68 kg/m. GEN: Well nourished, well developed, in no acute distress  HEENT: normal  Neck: no JVD, carotid bruits, or masses Cardiac: RRR; no murmurs, rubs, or gallops,no edema  Respiratory:  clear to auscultation bilaterally, normal work of breathing GI: soft, nontender, nondistended, + BS MS: no deformity or atrophy  Skin: warm and dry, no rash Neuro:  Strength and  sensation are intact Psych: euthymic mood, full affect   EKG:  EKG is ordered today. The ekg ordered today demonstrates sinus bradycardia with no significant ST or T wave changes.   Recent Labs: 04/27/2017: ALT 12; TSH 1.880 07/15/2017: BUN 17; Creatinine, Ser 0.95; Hemoglobin 12.4; Platelets 207; Potassium 3.7; Sodium 139    Lipid Panel No results found for: CHOL, TRIG, HDL, CHOLHDL, VLDL, LDLCALC, LDLDIRECT    Wt Readings from Last 3 Encounters:  07/19/17 140 lb 8 oz (63.7 kg)  07/15/17 136 lb (61.7 kg)  04/27/17 146 lb 6.4 oz (66.4 kg)      PAD Screen 07/19/2017  Previous PAD dx? No  Previous surgical procedure? No  Pain with walking? No  Feet/toe relief with dangling? No  Painful, non-healing ulcers? No  Extremities discolored? No      ASSESSMENT AND PLAN:  1.  Noncardiac chest pain: The chest pain does not seem to be cardiac in nature and likely was musculoskeletal or GI.  Her symptoms resolved.  EKG is unremarkable with no ischemic changes.  I did review EKGs done in the emergency room which was suggestive of possible left atrial enlargement.  I explained to her that this is a borderline finding and does not require further investigation especially with normal cardiac exam and no convincing cardiac symptoms. Her presentation was not suggestive of pulmonary embolism and she has no signs of DVT.  Her abdomen is not tender and there is nothing to suggest gallbladder disease.  I do not recommend further workup unless she develops recurrent symptoms.    Disposition:   FU with me as needed.  Signed,  Kathlyn Sacramento, MD  07/19/2017 3:11 PM    [place]

## 2017-07-21 NOTE — Addendum Note (Signed)
Addended by: Anselm Pancoast on: 07/21/2017 08:22 AM   Modules accepted: Orders

## 2017-09-26 NOTE — Progress Notes (Signed)
Patient ID: Stephanie Chambers, female   DOB: 12/27/1975, 42 y.o.   MRN: [id number] ANNUAL PREVENTATIVE CARE GYN  ENCOUNTER NOTE  Subjective:       Stephanie Chambers is a 42 y.o. G1P1 female here for a routine annual gynecologic exam.  Current complaints:  1.  none   Bowel and bladder function are normal. Father and maternal grandmother had colon cancer, with father's diagnosis being made at age 32. Mom died in 2017-10-08 per medicine breast cancer; she is coping reasonably well with her passing.  She continues on Wellbutrin.  She has been using short-term lorazepam for help with sleep as other ongoing life stressors including bloating in the house, having to move out of her house due to recent.  Sale   Gynecologic History LMP:09/25/2017 Contraception: none Last Pap: 05/2013 neg/neg.  Results were: normal Last mammogram: 02/21/2016 birad 1. Results were: normal  Obstetric History OB History  Gravida Para Term Preterm AB Living  1 1          SAB TAB Ectopic Multiple Live Births               # Outcome Date GA Lbr Len/2nd Weight Sex Delivery Anes PTL Lv  1 Para             Obstetric Comments  1st Menstrual Cycle:  11  1st Pregnancy:  31    Past Medical History:  Diagnosis Date  . Anemia   . Anxiety   . Bulging of cervical intervertebral disc   . Constipation   . Depression    major  . DUB (dysfunctional uterine bleeding)   . Endometrial polyp 05/2012   hysteroscopy w/ d &C  . Epigastric abdominal pain   . Family history of breast cancer in mother   . Lump in female breast   . Mastodynia   . Menorrhagia   . UTI (lower urinary tract infection)     Past Surgical History:  Procedure Laterality Date  . DIAGNOSTIC LAPAROSCOPY  2004  . DILATION AND CURETTAGE OF UTERUS    . HYSTEROSCOPY    . TONSILLECTOMY  1998    Current Outpatient Medications on File Prior to Visit  Medication Sig Dispense Refill  . BIOTIN PO Take by mouth.    Marland Kitchen buPROPion (WELLBUTRIN SR) 150 MG 12 hr  tablet TAKE 1 TABLET BY MOUTH TWO TIMES DAILY 120 tablet 6  . fluticasone (FLONASE) 50 MCG/ACT nasal spray Place 2 sprays into both nostrils daily. 16 g 1  . sodium chloride (OCEAN) 0.65 % SOLN nasal spray Place 2 sprays into both nostrils every 2 (two) hours while awake.  0   No current facility-administered medications on file prior to visit.     Allergies  Allergen Reactions  . Other Shortness Of Breath    omnicef    Social History   Socioeconomic History  . Marital status: Married    Spouse name: Not on file  . Number of children: Not on file  . Years of education: Not on file  . Highest education level: Not on file  Occupational History  . Not on file  Social Needs  . Financial resource strain: Not on file  . Food insecurity:    Worry: Not on file    Inability: Not on file  . Transportation needs:    Medical: Not on file    Non-medical: Not on file  Tobacco Use  . Smoking status: Former Research scientist (life sciences)  . Smokeless tobacco: Never  Used  Substance and Sexual Activity  . Alcohol use: Yes    Alcohol/week: 0.0 oz    Comment: occas  . Drug use: No  . Sexual activity: Yes    Birth control/protection: None  Lifestyle  . Physical activity:    Days per week: Not on file    Minutes per session: Not on file  . Stress: Not on file  Relationships  . Social connections:    Talks on phone: Not on file    Gets together: Not on file    Attends religious service: Not on file    Active member of club or organization: Not on file    Attends meetings of clubs or organizations: Not on file    Relationship status: Not on file  . Intimate partner violence:    Fear of current or ex partner: Not on file    Emotionally abused: Not on file    Physically abused: Not on file    Forced sexual activity: Not on file  Other Topics Concern  . Not on file  Social History Narrative  . Not on file    Family History  Problem Relation Age of Onset  . Cancer Mother 63       breast with liver  mets  . Breast cancer Mother   . Cancer Father 51       colon, lung, kidney  . Heart disease Father   . Depression Maternal Grandmother   . Cancer Maternal Grandmother        colon  . Depression Paternal Grandmother   . Heart disease Paternal Grandfather   . Breast cancer Cousin     The following portions of the patient's history were reviewed and updated as appropriate: allergies, current medications, past family history, past medical history, past social history, past surgical history and problem list.  Review of Systems Review of Systems  Constitutional: Positive for weight loss.  HENT: Negative.   Eyes: Negative.   Respiratory: Negative.   Cardiovascular: Negative.   Gastrointestinal: Negative.   Genitourinary: Negative.   Musculoskeletal: Negative.   Skin: Negative.   Neurological: Negative.   Endo/Heme/Allergies: Negative.   Psychiatric/Behavioral: The patient is nervous/anxious.      Objective:   BP 100/64   Pulse 89   Ht 5\' 6"  (1.676 m)   Wt 134 lb 4.8 oz (60.9 kg)   LMP 09/26/2017 (Exact Date)   BMI 21.68 kg/m  CONSTITUTIONAL: Well-developed, well-nourished female in no acute distress.  PSYCHIATRIC: Normal mood and affect. Normal behavior. Normal judgment and thought content. [place]: Alert and oriented to person, place, and time. Normal muscle tone coordination. No cranial nerve deficit noted. HENT:  Normocephalic, atraumatic, External right and left ear normal.  EYES: Conjunctivae and EOM are normal.  No scleral icterus.  NECK: Normal range of motion, supple, no masses.  Normal thyroid.  SKIN: Skin is warm and dry. No rash noted. Not diaphoretic. No erythema. No pallor. CARDIOVASCULAR: Normal heart rate noted, regular rhythm, no murmur. RESPIRATORY: Clear to auscultation bilaterally. Effort and breath sounds normal, no problems with respiration noted. BREASTS: Symmetric in size. No masses, skin changes, nipple drainage, or lymphadenopathy. ABDOMEN: Soft,  no distention noted.  No tenderness, rebound or guarding.  BLADDER: Normal PELVIC:  External Genitalia: Normal  BUS: Normal  Vagina: Normal; good vault support  Cervix: Normal; No lesions; no discharge  Uterus: Normal; midplane, normal size and shape, mobile, nontender  Adnexa: Normal; nonpalpable nontender  RV: External Exam NormaI  MUSCULOSKELETAL:  Normal range of motion. No tenderness.  No cyanosis, clubbing, or edema.  2+ distal pulses. LYMPHATIC: No Axillary, Supraclavicular, or Inguinal Adenopathy.    Assessment:   Annual gynecologic examination 42 y.o. Contraception: none Normal BMI PMS Stressors  Plan:  Pap: Pap/hpv Mammogram: Ordered Stool Guaiac Testing:  Not Indicated Labs: decline Routine preventative health maintenance measures emphasized: Exercise/Diet/Weight control, Tobacco Warnings, Alcohol/Substance use risks, Stress Management and Safe Sex Refill Wellbutrin Return to [place], CMA  Brayton Mars, MD   Note: This dictation was prepared with Dragon dictation along with smaller phrase technology. Any transcriptional errors that result from this process are unintentional.

## 2017-10-06 ENCOUNTER — Encounter: Payer: Self-pay | Admitting: Obstetrics and Gynecology

## 2017-10-06 ENCOUNTER — Ambulatory Visit (INDEPENDENT_AMBULATORY_CARE_PROVIDER_SITE_OTHER): Payer: 59 | Admitting: Obstetrics and Gynecology

## 2017-10-06 VITALS — BP 100/64 | HR 89 | Ht 66.0 in | Wt 134.3 lb

## 2017-10-06 DIAGNOSIS — Z803 Family history of malignant neoplasm of breast: Secondary | ICD-10-CM | POA: Diagnosis not present

## 2017-10-06 DIAGNOSIS — Z1231 Encounter for screening mammogram for malignant neoplasm of breast: Secondary | ICD-10-CM | POA: Diagnosis not present

## 2017-10-06 DIAGNOSIS — Z01419 Encounter for gynecological examination (general) (routine) without abnormal findings: Secondary | ICD-10-CM | POA: Diagnosis not present

## 2017-10-06 DIAGNOSIS — Z1239 Encounter for other screening for malignant neoplasm of breast: Secondary | ICD-10-CM

## 2017-10-06 DIAGNOSIS — Z800 Family history of malignant neoplasm of digestive organs: Secondary | ICD-10-CM | POA: Diagnosis not present

## 2017-10-06 MED ORDER — BUPROPION HCL ER (SR) 150 MG PO TB12: 150.0000 mg | ORAL_TABLET | Freq: Two times a day (BID) | ORAL | 6 refills | Status: DC

## 2017-10-06 NOTE — Patient Instructions (Addendum)
1.  Pap smear is completed. 2.  Self breast awareness is encouraged; mammogram ordered 3.  Screening labs are deferred this year 4.  Continue with healthy eating and exercise. 5.  Contraception-none 6.  Consider testing for cancer syndromes 7.  Return in 1 year for annual exam  Health Maintenance, Female Adopting a healthy lifestyle and getting preventive care can go a long way to promote health and wellness. Talk with your health care provider about what schedule of regular examinations is right for you. This is a good chance for you to check in with your provider about disease prevention and staying healthy. In between checkups, there are plenty of things you can do on your own. Experts have done a lot of research about which lifestyle changes and preventive measures are most likely to keep you healthy. Ask your health care provider for more information. Weight and diet Eat a healthy diet  Be sure to include plenty of vegetables, fruits, low-fat dairy products, and lean protein.  Do not eat a lot of foods high in solid fats, added sugars, or salt.  Get regular exercise. This is one of the most important things you can do for your health. ? Most adults should exercise for at least 150 minutes each week. The exercise should increase your heart rate and make you sweat (moderate-intensity exercise). ? Most adults should also do strengthening exercises at least twice a week. This is in addition to the moderate-intensity exercise.  Maintain a healthy weight  Body mass index (BMI) is a measurement that can be used to identify possible weight problems. It estimates body fat based on height and weight. Your health care provider can help determine your BMI and help you achieve or maintain a healthy weight.  For females 36 years of age and older: ? A BMI below 18.5 is considered underweight. ? A BMI of 18.5 to 24.9 is normal. ? A BMI of 25 to 29.9 is considered overweight. ? A BMI of 30 and  above is considered obese.  Watch levels of cholesterol and blood lipids  You should start having your blood tested for lipids and cholesterol at 42 years of age, then have this test every 5 years.  You may need to have your cholesterol levels checked more often if: ? Your lipid or cholesterol levels are high. ? You are older than 42 years of age. ? You are at high risk for heart disease.  Cancer screening Lung Cancer  Lung cancer screening is recommended for adults 46-51 years old who are at high risk for lung cancer because of a history of smoking.  A yearly low-dose CT scan of the lungs is recommended for people who: ? Currently smoke. ? Have quit within the past 15 years. ? Have at least a 30-pack-year history of smoking. A pack year is smoking an average of one pack of cigarettes a day for 1 year.  Yearly screening should continue until it has been 15 years since you quit.  Yearly screening should stop if you develop a health problem that would prevent you from having lung cancer treatment.  Breast Cancer  Practice breast self-awareness. This means understanding how your breasts normally appear and feel.  It also means doing regular breast self-exams. Let your health care provider know about any changes, no matter how small.  If you are in your 20s or 30s, you should have a clinical breast exam (CBE) by a health care provider every 1-3 years as part of  a regular health exam.  If you are 19 or older, have a CBE every year. Also consider having a breast X-ray (mammogram) every year.  If you have a family history of breast cancer, talk to your health care provider about genetic screening.  If you are at high risk for breast cancer, talk to your health care provider about having an MRI and a mammogram every year.  Breast cancer gene (BRCA) assessment is recommended for women who have family members with BRCA-related cancers. BRCA-related cancers  include: ? Breast. ? Ovarian. ? Tubal. ? Peritoneal cancers.  Results of the assessment will determine the need for genetic counseling and BRCA1 and BRCA2 testing.  Cervical Cancer Your health care provider may recommend that you be screened regularly for cancer of the pelvic organs (ovaries, uterus, and vagina). This screening involves a pelvic examination, including checking for microscopic changes to the surface of your cervix (Pap test). You may be encouraged to have this screening done every 3 years, beginning at age 34.  For women ages 4-65, health care providers may recommend pelvic exams and Pap testing every 3 years, or they may recommend the Pap and pelvic exam, combined with testing for human papilloma virus (HPV), every 5 years. Some types of HPV increase your risk of cervical cancer. Testing for HPV may also be done on women of any age with unclear Pap test results.  Other health care providers may not recommend any screening for nonpregnant women who are considered low risk for pelvic cancer and who do not have symptoms. Ask your health care provider if a screening pelvic exam is right for you.  If you have had past treatment for cervical cancer or a condition that could lead to cancer, you need Pap tests and screening for cancer for at least 20 years after your treatment. If Pap tests have been discontinued, your risk factors (such as having a new sexual partner) need to be reassessed to determine if screening should resume. Some women have medical problems that increase the chance of getting cervical cancer. In these cases, your health care provider may recommend more frequent screening and Pap tests.  Colorectal Cancer  This type of cancer can be detected and often prevented.  Routine colorectal cancer screening usually begins at 42 years of age and continues through 43 years of age.  Your health care provider may recommend screening at an earlier age if you have risk factors  for colon cancer.  Your health care provider may also recommend using home test kits to check for hidden blood in the stool.  A small camera at the end of a tube can be used to examine your colon directly (sigmoidoscopy or colonoscopy). This is done to check for the earliest forms of colorectal cancer.  Routine screening usually begins at age 37.  Direct examination of the colon should be repeated every 5-10 years through 42 years of age. However, you may need to be screened more often if early forms of precancerous polyps or small growths are found.  Skin Cancer  Check your skin from head to toe regularly.  Tell your health care provider about any new moles or changes in moles, especially if there is a change in a mole's shape or color.  Also tell your health care provider if you have a mole that is larger than the size of a pencil eraser.  Always use sunscreen. Apply sunscreen liberally and repeatedly throughout the day.  Protect yourself by wearing long sleeves,  pants, a wide-brimmed hat, and sunglasses whenever you are outside.  Heart disease, diabetes, and high blood pressure  High blood pressure causes heart disease and increases the risk of stroke. High blood pressure is more likely to develop in: ? People who have blood pressure in the high end of the normal range (130-139/85-89 mm Hg). ? People who are overweight or obese. ? People who are African American.  If you are 18-39 years of age, have your blood pressure checked every 3-5 years. If you are 40 years of age or older, have your blood pressure checked every year. You should have your blood pressure measured twice-once when you are at a hospital or clinic, and once when you are not at a hospital or clinic. Record the average of the two measurements. To check your blood pressure when you are not at a hospital or clinic, you can use: ? An automated blood pressure machine at a pharmacy. ? A home blood pressure monitor.  If  you are between 55 years and 79 years old, ask your health care provider if you should take aspirin to prevent strokes.  Have regular diabetes screenings. This involves taking a blood sample to check your fasting blood sugar level. ? If you are at a normal weight and have a low risk for diabetes, have this test once every three years after 42 years of age. ? If you are overweight and have a high risk for diabetes, consider being tested at a younger age or more often. Preventing infection Hepatitis B  If you have a higher risk for hepatitis B, you should be screened for this virus. You are considered at high risk for hepatitis B if: ? You were born in a country where hepatitis B is common. Ask your health care provider which countries are considered high risk. ? Your parents were born in a high-risk country, and you have not been immunized against hepatitis B (hepatitis B vaccine). ? You have HIV or AIDS. ? You use needles to inject street drugs. ? You live with someone who has hepatitis B. ? You have had sex with someone who has hepatitis B. ? You get hemodialysis treatment. ? You take certain medicines for conditions, including cancer, organ transplantation, and autoimmune conditions.  Hepatitis C  Blood testing is recommended for: ? Everyone born from 1945 through 1965. ? Anyone with known risk factors for hepatitis C.  Sexually transmitted infections (STIs)  You should be screened for sexually transmitted infections (STIs) including gonorrhea and chlamydia if: ? You are sexually active and are younger than 42 years of age. ? You are older than 42 years of age and your health care provider tells you that you are at risk for this type of infection. ? Your sexual activity has changed since you were last screened and you are at an increased risk for chlamydia or gonorrhea. Ask your health care provider if you are at risk.  If you do not have HIV, but are at risk, it may be recommended  that you take a prescription medicine daily to prevent HIV infection. This is called pre-exposure prophylaxis (PrEP). You are considered at risk if: ? You are sexually active and do not regularly use condoms or know the HIV status of your partner(s). ? You take drugs by injection. ? You are sexually active with a partner who has HIV.  Talk with your health care provider about whether you are at high risk of being infected with HIV. If you   choose to begin PrEP, you should first be tested for HIV. You should then be tested every 3 months for as long as you are taking PrEP. Pregnancy  If you are premenopausal and you may become pregnant, ask your health care provider about preconception counseling.  If you may become pregnant, take 400 to 800 micrograms (mcg) of folic acid every day.  If you want to prevent pregnancy, talk to your health care provider about birth control (contraception). Osteoporosis and menopause  Osteoporosis is a disease in which the bones lose minerals and strength with aging. This can result in serious bone fractures. Your risk for osteoporosis can be identified using a bone density scan.  If you are 65 years of age or older, or if you are at risk for osteoporosis and fractures, ask your health care provider if you should be screened.  Ask your health care provider whether you should take a calcium or vitamin D supplement to lower your risk for osteoporosis.  Menopause may have certain physical symptoms and risks.  Hormone replacement therapy may reduce some of these symptoms and risks. Talk to your health care provider about whether hormone replacement therapy is right for you. Follow these instructions at home:  Schedule regular health, dental, and eye exams.  Stay current with your immunizations.  Do not use any tobacco products including cigarettes, chewing tobacco, or electronic cigarettes.  If you are pregnant, do not drink alcohol.  If you are  breastfeeding, limit how much and how often you drink alcohol.  Limit alcohol intake to no more than 1 drink per day for nonpregnant women. One drink equals 12 ounces of beer, 5 ounces of wine, or 1 ounces of hard liquor.  Do not use street drugs.  Do not share needles.  Ask your health care provider for help if you need support or information about quitting drugs.  Tell your health care provider if you often feel depressed.  Tell your health care provider if you have ever been abused or do not feel safe at home. This information is not intended to replace advice given to you by your health care provider. Make sure you discuss any questions you have with your health care provider. Document Released: 10/19/2010 Document Revised: 09/11/2015 Document Reviewed: 01/07/2015 Elsevier Interactive Patient Education  [employer].

## 2017-10-11 LAB — IGP, COBASHPV16/18
HPV 16: NEGATIVE
HPV 18: NEGATIVE
HPV other hr types: NEGATIVE
PAP SMEAR COMMENT: 0

## 2017-11-14 ENCOUNTER — Telehealth: Payer: Self-pay | Admitting: Cardiovascular Disease

## 2017-11-14 NOTE — Telephone Encounter (Signed)
Called patient. She's been having "twinge of sharp pain" in her chest off and on all day. It's been going on, mainly at night when lying down, for a few months. She denies shortness of breath, nausea, vomiting, or left arm or jaw pain. Rescheduled patient to see Thurmond Butts on 11/16/17. Pt verbalized understanding to call 911 or go to the emergency room, if he develops any new or worsening symptoms.

## 2017-11-14 NOTE — Telephone Encounter (Signed)
Patient came by office Has been having issue with palpitations at night when lying down  Also a slight pain in chest off and on  Scheduled with C. Berge on 8/27 Please call to discuss

## 2017-11-16 ENCOUNTER — Ambulatory Visit: Payer: 59 | Admitting: Physician Assistant

## 2017-11-16 ENCOUNTER — Encounter: Payer: Self-pay | Admitting: Physician Assistant

## 2017-11-16 ENCOUNTER — Ambulatory Visit (INDEPENDENT_AMBULATORY_CARE_PROVIDER_SITE_OTHER): Payer: 59

## 2017-11-16 VITALS — BP 104/60 | HR 61 | Ht 66.0 in | Wt 138.0 lb

## 2017-11-16 DIAGNOSIS — R079 Chest pain, unspecified: Secondary | ICD-10-CM

## 2017-11-16 DIAGNOSIS — R002 Palpitations: Secondary | ICD-10-CM

## 2017-11-16 DIAGNOSIS — R0789 Other chest pain: Secondary | ICD-10-CM

## 2017-11-16 DIAGNOSIS — L659 Nonscarring hair loss, unspecified: Secondary | ICD-10-CM | POA: Diagnosis not present

## 2017-11-16 NOTE — Progress Notes (Signed)
Cardiology Office Note Date:  11/16/2017  Patient ID:  Stephanie Chambers, Stephanie Chambers Feb 19, 1976, MRN [id number] PCP:  Stephanie Starks, PA-C  Cardiologist:  Dr. Fletcher Anon, MD    Chief Complaint: Chest pain/palpitations   History of Present Illness: Stephanie Chambers is a 42 y.o. female with history of prior tobacco abuse in her 56s, and family history of CAD in her father who is s/p CABG who presents for evaluation of chest pain and palpitations.   Patient was evaluated by Dr. Fletcher Anon on 07/19/2017 as a self-referral for chest pain. At that time, she noted sudden onset of left-sided chest pain while sitting at work (works in the [employer]). Pain was described as severe and stabbing, lasting for a few hours. It was associated with SOB. She initially presented to a local urgent care for evaluation and was told her EKG is abnormal, leading her to be transferred to the ED. Her EKG showed no acute ischemic changes with possible left atrial enlargement. In the ED, her troponin was negative, CXR was not acute, CBC was unremarkable, SCr 0.95, K+ 3.7. She had no recurrent symptoms. Upon seeing cardiology, she felt back to her baseline and noted she was running 2.5 miles at least 5 times per week. She was noted to be under increased stress at home due to her mother's terminal illness in hospice. Her EKG in our office showed sinus bradycardia without significant st/t changes. BP was well controlled at 100/58. No further testing was advised unless she developed recurrent symptoms. She called the office on 7/29 noting a several month history of palpitations and sharp chest pain, mostly occurring at nighttime when laying down.   Patient comes in today noting a mostly daily occurrence of intermittent palpitations that occur mostly at nighttime when she lays down. These episodes last for 1-5 minutes and spontaneously resolve. She thinks there may be some associated SOB with these palpitations, though is not certain. There is no  associated dizziness, presyncope, or syncope. Palpitations are not noticed during the daytime hours. When her palpitations were at their worst she was drinking [employer] coffee on a daily basis for 2 months. She discontinued this coffee as of 7/25. She last had an episode of palpitations on 7/28. She mostly drinks water throughout the day now and will drink tea or Coke when she goes out to a restaurant. She denies any tobacco or illegal drugs of abuse. She does drink ~ 2 margaritas weekly socially. She has also noted continued, intermittent sharp, upper left-sided chest pain that does not radiate. There are no associated symptoms with her chest pain. The pain has been noted more so at rest. Unfortunately, she is no longer exercising on a near daily basis following the death of her mother in September 24, 2017. 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 She understands these things are stressors, though does not feel like they are weighing on her. Of note, following her mother's death, the patient lost weight from 140 pounds down to ~ 120 pounds due to lack of appetite while grieving. She is now back up to 138 pounds. She denies any early satiety, PND, cough, lower extremity swelling, abdominal distension, or orthopnea. She is currently sexually active and not using birth control. She indicates she is not actively trying to get pregnant. She last had her menses the week prior, which was on time for her. Her father  had his bypass around age 22 (he was a prior smoker in the remote past). Her mother had a history of Afib that was initially diagnosed around age 65. She has been seen by her GYN for hair loss with thyroid studies being normal at that time. She continues to note her hair is finer/tinner now. She is currently asymptomatic.   Past Medical History:  Diagnosis Date  . Anemia   . Anxiety   . Bulging of cervical  intervertebral disc   . Constipation   . Depression    major  . DUB (dysfunctional uterine bleeding)   . Endometrial polyp 05/2012   hysteroscopy w/ d &C  . Epigastric abdominal pain   . Lump in female breast   . Mastodynia   . Menorrhagia   . UTI (lower urinary tract infection)     Past Surgical History:  Procedure Laterality Date  . DIAGNOSTIC LAPAROSCOPY  2004  . DILATION AND CURETTAGE OF UTERUS    . HYSTEROSCOPY    . TONSILLECTOMY  1998    Current Meds  Medication Sig  . buPROPion (WELLBUTRIN SR) 150 MG 12 hr tablet Take 1 tablet (150 mg total) by mouth 2 (two) times daily.  . fluticasone (FLONASE) 50 MCG/ACT nasal spray Place 2 sprays into both nostrils daily.  Marland Kitchen loratadine (CLARITIN) 10 MG tablet Take 10 mg by mouth daily.    Allergies:   Other   Social History:  The patient  reports that she has quit smoking. She has never used smokeless tobacco. She reports that she drinks alcohol. She reports that she does not use drugs.   Family History:  The patient's family history includes Breast cancer in her cousin and mother; Cancer in her maternal grandmother; Cancer (age of onset: 83) in her mother; Cancer (age of onset: 8) in her father; Colon cancer in her father and maternal grandmother; Depression in her maternal grandmother and paternal grandmother; Heart disease in her father and paternal grandfather.  ROS:   Review of Systems  Constitutional: Positive for malaise/fatigue. Negative for chills, diaphoresis, fever and weight loss.  HENT: Negative for congestion.   Eyes: Negative for discharge and redness.  Respiratory: Positive for shortness of breath. Negative for cough, hemoptysis, sputum production and wheezing.   Cardiovascular: Positive for chest pain and palpitations. Negative for orthopnea, claudication, leg swelling and PND.  Gastrointestinal: Negative for abdominal pain, blood in stool, heartburn, melena, nausea and vomiting.  Genitourinary: Negative for  hematuria.  Musculoskeletal: Negative for falls and myalgias.  Skin: Negative for rash.  Neurological: Negative for dizziness, tingling, tremors, sensory change, speech change, focal weakness, loss of consciousness and weakness.  Endo/Heme/Allergies: Does not bruise/bleed easily.  Psychiatric/Behavioral: Negative for substance abuse. The patient is not nervous/anxious.   All other systems reviewed and are negative.    PHYSICAL EXAM:  VS:  BP 104/60 (BP Location: Left Arm, Patient Position: Sitting, Cuff Size: Normal)   Pulse 61   Ht 5\' 6"  (1.676 m)   Wt 138 lb (62.6 kg)   LMP 11/09/2017 (Exact Date)   BMI 22.27 kg/m  BMI: Body mass index is 22.27 kg/m.  Physical Exam  Constitutional: She is oriented to person, place, and time. She appears well-developed and well-nourished.  HENT:  Head: Normocephalic and atraumatic.  Eyes: Right eye exhibits no discharge. Left eye exhibits no discharge.  Neck: Normal range of motion. No JVD present.  Cardiovascular: Normal rate, regular rhythm, S1 normal, S2 normal and normal heart sounds. Exam reveals no  distant heart sounds, no friction rub, no midsystolic click and no opening snap.  No murmur heard. Pulses:      Dorsalis pedis pulses are 2+ on the right side, and 2+ on the left side.       Posterior tibial pulses are 2+ on the right side, and 2+ on the left side.  Pulmonary/Chest: Effort normal and breath sounds normal. No respiratory distress. She has no decreased breath sounds. She has no wheezes. She has no rales. She exhibits no tenderness.  Abdominal: Soft. She exhibits no distension. There is no tenderness.  Musculoskeletal: She exhibits no edema.  Neurological: She is alert and oriented to person, place, and time.  Skin: Skin is warm and dry. No cyanosis. Nails show no clubbing.  Psychiatric: She has a normal mood and affect. Her speech is normal and behavior is normal. Judgment and thought content normal.      EKG:  Was ordered and  interpreted by me today. Shows NSR, 61 bpm, possible left atrial enlargement, no acute st/t changes (unchanged from prior)   Recent Labs: 04/27/2017: ALT 12; TSH 1.880 07/15/2017: BUN 17; Creatinine, Ser 0.95; Hemoglobin 12.4; Platelets 207; Potassium 3.7; Sodium 139  No results found for requested labs within last 8760 hours.   CrCl cannot be calculated (Patient's most recent lab result is older than the maximum 21 days allowed.).   Wt Readings from Last 3 Encounters:  11/16/17 138 lb (62.6 kg)  10/06/17 134 lb 4.8 oz (60.9 kg)  07/19/17 140 lb 8 oz (63.7 kg)     Other studies reviewed: Additional studies/records reviewed today include: summarized above  ASSESSMENT AND PLAN:  1. Palpitations: Possibly exacerbated by increased caffeine intake. Agree with the patient limiting caffeine intake moving forward. Place 14-day Zio monitor to evaluate for arrhythmias/ectopy. Check cbc, tsh, magnesium, and cmp. Less likely related to her Wellbutrin given she has been on this medication dating back to at least 2015. Offered HCG, she declines.   2. Atypical chest pain: Symptoms are not consistent with angina. Previously noted to run 2 miles at least 5 days per week without symptoms. She has not been exercising lately, though this is due to her grieving the loss of her mother and with the added stress of living out of an RV currently while her new home is being built. Offered stress echo, patient would like to think on this at this time. Check lipid panel for further risk stratification. Not consistent with a DVT.    3. Hair loss: Recent thyroid studies normal. Recheck TSH. Consider workup for pheochromocytoma given palpitations if Holter monitor is unrevealing.    Disposition: F/u with Dr. Fletcher Anon or an APP in 4 weeks.   Current medicines are reviewed at length with the patient today.  The patient did not have any concerns regarding medicines.  Signed, Christell Faith, PA-C 11/16/2017 3:00 PM     [address], [place] [address] [phone]

## 2017-11-16 NOTE — Patient Instructions (Signed)
Labwork: Labs done today Cmet, TSH, CBC, Mag, Lipid panel. We will call you with these results.   Testing/Procedures: Your physician has recommended that you wear an event monitor. Event monitors are medical devices that record the heart's electrical activity. Doctors most often [place] these monitors to diagnose arrhythmias. Arrhythmias are problems with the speed or rhythm of the heartbeat. The monitor is a small, portable device. You can wear one while you do your normal daily activities. This is usually used to diagnose what is causing palpitations/syncope (passing out).    Follow-Up: Your physician recommends that you schedule a follow-up appointment in: 4 weeks with Dr. Fletcher Anon or APP  It was a pleasure seeing you today here in the office. Please do not hesitate to give [place] a call back if you have any further questions. [place], BSN     Any Other Special Instructions Will Be Listed Below (If Applicable).     If you need a refill on your cardiac medications before your next appointment, please call your pharmacy.

## 2017-11-17 LAB — LIPID PANEL
CHOL/HDL RATIO: 2.3 ratio (ref 0.0–4.4)
CHOLESTEROL TOTAL: 213 mg/dL — AB (ref 100–199)
HDL: 93 mg/dL (ref >39)
LDL CALC: 111 mg/dL — AB (ref 0–99)
TRIGLYCERIDES: 46 mg/dL (ref 0–149)
VLDL CHOLESTEROL CAL: 9 mg/dL (ref 5–40)

## 2017-11-17 LAB — MAGNESIUM: Magnesium: 2.3 mg/dL (ref 1.6–2.3)

## 2017-11-17 LAB — COMPREHENSIVE METABOLIC PANEL
A/G RATIO: 2.2 (ref 1.2–2.2)
ALBUMIN: 4.8 g/dL (ref 3.5–5.5)
ALT: 10 IU/L (ref 0–32)
AST: 14 IU/L (ref 0–40)
Alkaline Phosphatase: 58 IU/L (ref 39–117)
BUN / CREAT RATIO: 18 (ref 9–23)
BUN: 17 mg/dL (ref 6–24)
Bilirubin Total: <0.2 mg/dL (ref 0.0–1.2)
CALCIUM: 9.6 mg/dL (ref 8.7–10.2)
CO2: 22 mmol/L (ref 20–29)
CREATININE: 0.95 mg/dL (ref 0.57–1.00)
Chloride: 103 mmol/L (ref 96–106)
GFR, EST AFRICAN AMERICAN: 85 mL/min/{1.73_m2} (ref >59)
GFR, EST NON AFRICAN AMERICAN: 74 mL/min/{1.73_m2} (ref >59)
GLOBULIN, TOTAL: 2.2 g/dL (ref 1.5–4.5)
Glucose: 90 mg/dL (ref 65–99)
POTASSIUM: 4.3 mmol/L (ref 3.5–5.2)
SODIUM: 141 mmol/L (ref 134–144)
TOTAL PROTEIN: 7 g/dL (ref 6.0–8.5)

## 2017-11-17 LAB — CBC WITH DIFFERENTIAL/PLATELET
BASOS: 0 %
Basophils Absolute: 0 10*3/uL (ref 0.0–0.2)
EOS (ABSOLUTE): 0.1 10*3/uL (ref 0.0–0.4)
EOS: 2 %
HEMATOCRIT: 35.3 % (ref 34.0–46.6)
HEMOGLOBIN: 11.6 g/dL (ref 11.1–15.9)
Immature Grans (Abs): 0 10*3/uL (ref 0.0–0.1)
Immature Granulocytes: 0 %
LYMPHS ABS: 1.5 10*3/uL (ref 0.7–3.1)
Lymphs: 35 %
MCH: 30.1 pg (ref 26.6–33.0)
MCHC: 32.9 g/dL (ref 31.5–35.7)
MCV: 92 fL (ref 79–97)
MONOCYTES: 8 %
Monocytes Absolute: 0.3 10*3/uL (ref 0.1–0.9)
Neutrophils Absolute: 2.3 10*3/uL (ref 1.4–7.0)
Neutrophils: 55 %
Platelets: 234 10*3/uL (ref 150–450)
RBC: 3.86 x10E6/uL (ref 3.77–5.28)
RDW: 12.4 % (ref 12.3–15.4)
WBC: 4.2 10*3/uL (ref 3.4–10.8)

## 2017-11-17 LAB — TSH: TSH: 1.25 u[IU]/mL (ref 0.450–4.500)

## 2017-11-17 NOTE — Addendum Note (Signed)
Addended by: Anselm Pancoast on: 11/17/2017 01:25 PM   Modules accepted: Orders

## 2017-11-23 ENCOUNTER — Telehealth: Payer: Self-pay | Admitting: Cardiovascular Disease

## 2017-11-23 NOTE — Telephone Encounter (Signed)
Patient would like to discuss ZIO monitor with nurse Would like to know if it will be okay to take it off after a week rather than two weeks Will be going to the beach  Please call to discuss

## 2017-11-23 NOTE — Telephone Encounter (Signed)
No answer. Left detail message with recommendations, ok per DPR, and to call back if any questions.  

## 2017-11-23 NOTE — Telephone Encounter (Signed)
Patient calling back. She's been wearing the ZIO since 11/16/17. She says she has not had the "flip-flopping" palpitations since she called our office and started wearing the monitor. She is leaving for the beach on Friday and would like to take it off tomorrow. She would like to know if that is ok and is a sufficient amount of time of wear. Advised I will route to [facility] for advice.

## 2017-11-23 NOTE — Telephone Encounter (Signed)
Yes, I think that would be fine as long as she is feeling well.

## 2017-11-23 NOTE — Telephone Encounter (Signed)
Left a message to call back.

## 2017-11-25 ENCOUNTER — Ambulatory Visit (INDEPENDENT_AMBULATORY_CARE_PROVIDER_SITE_OTHER): Payer: 59 | Admitting: *Deleted

## 2017-11-25 VITALS — BP 106/78 | HR 56 | Ht 66.0 in | Wt 136.4 lb

## 2017-11-25 DIAGNOSIS — R002 Palpitations: Secondary | ICD-10-CM | POA: Diagnosis not present

## 2017-11-25 NOTE — Progress Notes (Signed)
1.) Reason for visit: EKG  2.) Name of MD requesting visit: Arida/ Dunn, PA (add on from phone call 8/9)  3.) H&P: The patient was seen by Stephanie Faith, PA on 11/16/17 for evaluation of chest pain and palpitations. She was previously evaluated on 07/19/17 by Dr. Fletcher Anon as a self consult for chest pain after a negative workup in the ER. Ryan ordered a ZIO monitor for palpitations on 11/16/17. She called the office earlier this week asking if she could take it off early due to the fact that she was supposed to be going to the beach today. She was advised this was ok if no ongoing symptoms. The patient reports that when she got out of the shower yesterday, her monitor was falling off, so she did mail it back yesterday. She left a message at the front desk this morning, as she works at the [employer], stating that she had an episode of palpitations after laying back down from letting her dog out earlier today. She then developed left arm pain. I advised she could come by for an EKG/ BP or be seen in the ER.  4.) ROS related to problem: The patient reports that palpitations this morning lasted only about 20-30 seconds. Left arm discomfort has been going on since then. I inquired if she has any new stressors in her life/ caffeine intake. She is still in the process of building a house. She has cut out her caffeine intake to almost none. She does not feel like she is internalizing any of her emotions at this time.   5.) Assessment and plan per MD: EKG obtained showing sinus bradycardia with a HR of 56 bpm. BP- 108/78. The patient stated she had mild chest discomfort to the middle of her chest while lying on the table. She did try to sit up and lay down while on the monitor because most of her palpitations seem to occur at night when she lays down. I did watch the patient on the 12-lead EKG machine for about 3-5 minutes to see if she was having any irregular heart beats at all. None noticed during the course of having  her hooked up. I advised the patient that I could not see that she had had any changes to her EKG when compared to her last one, but I will have Dr. Fletcher Anon review this when he is in the office today. She is aware that we will call her with her ZIO monitor results. She did push the button while wearing it when having symptoms. She is also aware, that per Thurmond Butts, [place], a stress echo could be an option for her. She is wanting to wait for the results of the ZIO.

## 2017-11-25 NOTE — Telephone Encounter (Signed)
Patient came by office  States that she took ZIO monitor off yesterday and then this morning she had a significant episode  Patient c/o Palpitations:  High priority if patient c/o lightheadedness, shortness of breath, or chest pain  1) How long have you had palpitations/irregular HR/ Afib? Are you having the symptoms now? This morning - still feels slight pain down left arm  2) Are you currently experiencing lightheadedness, SOB or CP? Slight SOB, heart feels like it is "flip flopping/pounding"  3) Do you have a history of afib (atrial fibrillation) or irregular heart rhythm?   4) Have you checked your BP or HR? (document readings if available): per watch heart rate 57  5) Are you experiencing any other symptoms? No

## 2017-11-25 NOTE — Progress Notes (Signed)
Okay we can wait until we see the results of the monitor before deciding on further work-up.

## 2017-11-25 NOTE — Telephone Encounter (Signed)
I spoke with the patient.  She states she was going to leave for the beach today and had called about taking her monitor off early. She was given the ok to do this if having no symptoms. She states he plan was to take it off this morning, but after her shower yesterday, it was falling off. She got up this morning to take her dog out and when she laid back down she had an episode of palpitations that lasted about 20-30 seconds that was "bad," then she developed pain running down her left arm.   She is quite concerned today as the left arm discomfort is new.  I have offered that she can come for an EKG/ BP check, or ER evaluation. She works in the [employer], so she will walk over here for further evaluation. I have advised her that [place], [place] had also mentioned a stress echo possibly. She states she was hoping to wait for the results of the ZIO.   Patient added on for a nurse visit.

## 2017-12-01 DIAGNOSIS — R002 Palpitations: Secondary | ICD-10-CM | POA: Diagnosis not present

## 2017-12-02 ENCOUNTER — Telehealth: Payer: Self-pay | Admitting: Nurse Practitioner

## 2017-12-02 DIAGNOSIS — R079 Chest pain, unspecified: Secondary | ICD-10-CM

## 2017-12-02 NOTE — Telephone Encounter (Signed)
Preliminary report showed: A minimum HR of 35 bpm, maximum HR of 154 bpm, and avgerage HR of 66 bpm. Predominant underlying rhythm was sinus rhythm. 1 run of NSVT occurred lasting 4 beats with a max rate of 133 bpm (avgerage 131 bpm). 1 run of SVT occurred lasting 5 beats with a maximum rate of 121 bpm (avg 108 bpm). NSVT was detected within +/- 45 seconds of symptomatic patient trigger. Isolated PACs were rare (<1.0%), and no atrial couplets or triplets were present. Isolated PVCs were rare (<1.0%), and no ventricular couplets or triplets were present.  Recent labs from 11/16/2017 show a normal thyroid function with potassium and magnesium at goal.   Her relative hypotension and baseline heart rate in the low 60s bpm does preclude the addition of standing beta blocker and calcium channel blocker therapy.   Recommend an echo to exclude structural heart abnormalities.

## 2017-12-02 NOTE — Telephone Encounter (Signed)
Please call regarding ZIO monitor results.

## 2017-12-02 NOTE — Telephone Encounter (Signed)
Patient calling back. She verbalized understanding of Ryan's preliminary report and will await Dr Tyrell Antonio review as well. She was very Patent attorney. She also wanted to make sure it was ok to continue exercising and running. Discussed with Thurmond Butts and he said this was ok. Patient verbalized understanding and was appreciative.

## 2017-12-02 NOTE — Telephone Encounter (Signed)
No answer. Left message to call back.   

## 2017-12-12 NOTE — Telephone Encounter (Signed)
Patient calling stating she would like a call back on full report on Monitor results  Please call back

## 2017-12-13 ENCOUNTER — Ambulatory Visit: Payer: Self-pay | Admitting: Nurse Practitioner

## 2017-12-13 NOTE — Telephone Encounter (Signed)
Reviewed recommendations by provider. Discussed all instructions for stress echocardiogram and advised that I would have scheduling call to get that arranged for her. Also reviewed signs and symptoms to monitor for that would require immediate evaluation in the ED. She verbalized understanding of all information with no further questions at this time.

## 2017-12-13 NOTE — Telephone Encounter (Signed)
If patient is continuing to have symptoms, I agree with ED evaluation. Please see my note on her monitor results for stress testing info. We should start with a stress echo given her age. I would like to avoid radiation if possible.

## 2017-12-13 NOTE — Addendum Note (Signed)
Addended by: Valora Corporal on: 12/13/2017 04:20 PM   Modules accepted: Orders

## 2017-12-13 NOTE — Telephone Encounter (Signed)
Spoke with patient and reviewed monitor results and recommendations. She reports having chest pain last night that radiated down left arm and into her neck. She did also have some shortness of breath. Recommended that she go to ED for further evaluation but she wants to wait and see what provider suggests. Will check with provider on which stress test to order for her symptoms and will try to see if I can schedule for tomorrow if possible. Again expressed going to ED for her symptoms and she verbalized understanding. She states that they do not normally find anything and would like to see what other testing could be done. Will route to provider for review and clarification.

## 2017-12-14 NOTE — Telephone Encounter (Signed)
Patient scheduled 12/21/17

## 2017-12-16 ENCOUNTER — Telehealth: Payer: Self-pay | Admitting: *Deleted

## 2017-12-16 NOTE — Telephone Encounter (Signed)
Patient called and reminded of appointment on 12/20/17 for the stress echo. She has been given instructions and verbalized her understanding.

## 2017-12-20 ENCOUNTER — Other Ambulatory Visit: Payer: Self-pay

## 2017-12-21 ENCOUNTER — Other Ambulatory Visit: Payer: Self-pay

## 2017-12-21 ENCOUNTER — Ambulatory Visit (INDEPENDENT_AMBULATORY_CARE_PROVIDER_SITE_OTHER): Payer: 59

## 2017-12-21 DIAGNOSIS — R079 Chest pain, unspecified: Secondary | ICD-10-CM | POA: Diagnosis not present

## 2017-12-21 LAB — ECHOCARDIOGRAM STRESS TEST
CHL CUP MPHR: 178 {beats}/min
CHL CUP RESTING HR STRESS: 92 {beats}/min
CHL RATE OF PERCEIVED EXERTION: 17
CSEPEDS: 49 s
CSEPHR: 101 %
Estimated workload: 13 METS
Exercise duration (min): 10 min
Peak HR: 181 {beats}/min

## 2017-12-23 ENCOUNTER — Ambulatory Visit: Payer: Self-pay | Admitting: Nurse Practitioner

## 2017-12-26 ENCOUNTER — Ambulatory Visit (INDEPENDENT_AMBULATORY_CARE_PROVIDER_SITE_OTHER): Payer: Self-pay | Admitting: Family Medicine

## 2017-12-26 ENCOUNTER — Encounter: Payer: Self-pay | Admitting: Family Medicine

## 2017-12-26 VITALS — BP 110/70 | HR 75 | Temp 98.6°F | Wt 141.0 lb

## 2017-12-26 DIAGNOSIS — J329 Chronic sinusitis, unspecified: Secondary | ICD-10-CM

## 2017-12-26 DIAGNOSIS — H65191 Other acute nonsuppurative otitis media, right ear: Secondary | ICD-10-CM

## 2017-12-26 MED ORDER — FLUTICASONE PROPIONATE 50 MCG/ACT NA SUSP: 2.0000 | Freq: Every day | NASAL | 6 refills | Status: DC

## 2017-12-26 MED ORDER — AZITHROMYCIN 250 MG PO TABS: ORAL_TABLET | ORAL | 0 refills | Status: DC

## 2017-12-26 NOTE — Progress Notes (Signed)
Patient ID: FEMALE IAFRATE, female    DOB: [birth year], 42 y.o.   MRN: [id number]  PCP: Versie Starks, PA-C  Chief Complaint  Patient presents with  . choice-sinus issue    Subjective:  HPI RAYHANA SLIDER is a 42 y.o. female presents for evaluation    SINUSITIS Onset: 3  days Facial/sinus pressure, greater right side with primarily post nasal drainage. Complains of persistent headache greater on right side with associated right ear pain and pressure.  Severity: moderate Tried OTC meds without significant relief. History of sinusitis  Afebrile. No throat pain, chest tightness, or cough. Feels fatigue and "grogginess". No recent tx with antibiotics. Prone to have a sinus infection at least annually.  Remainder of Review of Systems negative except as noted in the HPI.   Social History   Socioeconomic History  . Marital status: Married    Spouse name: Not on file  . Number of children: Not on file  . Years of education: Not on file  . Highest education level: Not on file  Occupational History  . Not on file  Social Needs  . Financial resource strain: Not on file  . Food insecurity:    Worry: Not on file    Inability: Not on file  . Transportation needs:    Medical: Not on file    Non-medical: Not on file  Tobacco Use  . Smoking status: Former Research scientist (life sciences)  . Smokeless tobacco: Never Used  Substance and Sexual Activity  . Alcohol use: Yes    Alcohol/week: 0.0 standard drinks    Comment: occas  . Drug use: No  . Sexual activity: Yes    Birth control/protection: None  Lifestyle  . Physical activity:    Days per week: 3 days    Minutes per session: 30 min  . Stress: Not on file  Relationships  . Social connections:    Talks on phone: Not on file    Gets together: Not on file    Attends religious service: Not on file    Active member of club or organization: Not on file    Attends meetings of clubs or organizations: Not on file    Relationship status: Not  on file  . Intimate partner violence:    Fear of current or ex partner: Not on file    Emotionally abused: Not on file    Physically abused: Not on file    Forced sexual activity: Not on file  Other Topics Concern  . Not on file  Social History Narrative  . Not on file    Family History  Problem Relation Age of Onset  . Cancer Mother 73       breast with liver mets  . Breast cancer Mother   . Cancer Father 41       colon, lung, kidney  . Heart disease Father   . Colon cancer Father   . Depression Maternal Grandmother   . Cancer Maternal Grandmother        colon  . Colon cancer Maternal Grandmother   . Depression Paternal Grandmother   . Heart disease Paternal Grandfather   . Breast cancer Cousin   . Ovarian cancer Neg Hx    Review of Systems Pertinent negatives listed in HPI Patient Active Problem List   Diagnosis Date Noted  . Multiple joint pain 04/27/2017  . Other fatigue 04/27/2017  . Right upper quadrant pain 04/27/2017  . Hair loss 04/27/2017  . Family history of colon  cancer in father 04/27/2017  . Pain of right breast 02/12/2016  . Family history of breast cancer in mother 02/12/2016  . Cephalalgia 10/14/2015  . PMS (premenstrual syndrome) 10/14/2015  . History of anemia 04/19/2013    Allergies  Allergen Reactions  . Other Shortness Of Breath    omnicef    Prior to Admission medications   Medication Sig Start Date End Date Taking? Authorizing Provider  buPROPion (WELLBUTRIN SR) 150 MG 12 hr tablet Take 1 tablet (150 mg total) by mouth 2 (two) times daily. 10/06/17  Yes Defrancesco, Alanda Slim, MD  loratadine (CLARITIN) 10 MG tablet Take 10 mg by mouth daily.   Yes [provider]  fluticasone (FLONASE) 50 MCG/ACT nasal spray Place 2 sprays into both nostrils daily. Patient not taking: Reported on 12/26/2017 03/17/17   Blima Singer, NP    Past Medical, Surgical Family and Social History reviewed and updated.    Objective:   Today's  Vitals   12/26/17 1035  BP: 110/70  Pulse: 75  Temp: 98.6 F (37 C)  SpO2: 99%  Weight: 141 lb (64 kg)    Wt Readings from Last 3 Encounters:  12/26/17 141 lb (64 kg)  11/25/17 136 lb 7 oz (61.9 kg)  11/16/17 138 lb (62.6 kg)    Physical Exam General Appearance:    Alert, cooperative, no distress  HENT:   ENT exam normal, no neck nodes or sinus tenderness and right TM fluid noted, right nares edematous and erythematous  Eyes:    PERRL, conjunctiva/corneas clear, EOM's intact       Lungs:     Clear to auscultation bilaterally, respirations unlabored  Heart:    Regular rate and rhythm  Neurologic:   Awake, alert, oriented x 3. No apparent focal neurological           defect.     Assessment & Plan:  1. Sinusitis, unspecified chronicity, unspecified location 2. Acute middle ear effusion, right Start Azithromycin Take 2 tabs x 1 dose, then 1 tab every day for x 4 days   Continue current antihistamine therapy.  Resume Fluticasone (Flonase) 50 MCG/ACT 2 sprays (in each nares), daily  If symptoms worsen or do not improve, return for follow-up, follow-up with PCP, or at the emergency department if severity of symptoms warrant a higher level of care.   Carroll Sage. Kenton Kingfisher, MSN, FNP-C [facility]  [address] [place], [place] [address] [phone]

## 2017-12-26 NOTE — Patient Instructions (Addendum)
Start Azithromycin Take 2 tabs x 1 dose, then 1 tab every day for x 4 days   Continue current antihistamine therapy.  Resume Fluticasone (Flonase) 50 MCG/ACT 2 sprays, daily   Sinusitis, Adult Sinusitis is soreness and inflammation of your sinuses. Sinuses are hollow spaces in the bones around your face. They are located:  Around your eyes.  In the middle of your forehead.  Behind your nose.  In your cheekbones.  Your sinuses and nasal passages are lined with a stringy fluid (mucus). Mucus normally drains out of your sinuses. When your nasal tissues get inflamed or swollen, the mucus can get trapped or blocked so air cannot flow through your sinuses. This lets bacteria, viruses, and funguses grow, and that leads to infection. Follow these instructions at home: Medicines  Take, use, or apply over-the-counter and prescription medicines only as told by your doctor. These may include nasal sprays.  If you were prescribed an antibiotic medicine, take it as told by your doctor. Do not stop taking the antibiotic even if you start to feel better. Hydrate and Humidify  Drink enough water to keep your pee (urine) clear or pale yellow.  Use a cool mist humidifier to keep the humidity level in your home above 50%.  Breathe in steam for 10-15 minutes, 3-4 times a day or as told by your doctor. You can do this in the bathroom while a hot shower is running.  Try not to spend time in cool or dry air. Rest  Rest as much as possible.  Sleep with your head raised (elevated).  Make sure to get enough sleep each night. General instructions  Put a warm, moist washcloth on your face 3-4 times a day or as told by your doctor. This will help with discomfort.  Wash your hands often with soap and water. If there is no soap and water, use hand sanitizer.  Do not smoke. Avoid being around people who are smoking (secondhand smoke).  Keep all follow-up visits as told by your doctor. This is  important. Contact a doctor if:  You have a fever.  Your symptoms get worse.  Your symptoms do not get better within 10 days. Get help right away if:  You have a very bad headache.  You cannot stop throwing up (vomiting).  You have pain or swelling around your face or eyes.  You have trouble seeing.  You feel confused.  Your neck is stiff.  You have trouble breathing. This information is not intended to replace advice given to you by your health care provider. Make sure you discuss any questions you have with your health care provider. Document Released: 09/22/2007 Document Revised: 11/30/2015 Document Reviewed: 01/29/2015 Elsevier Interactive Patient Education  [employer].

## 2017-12-28 ENCOUNTER — Telehealth: Payer: Self-pay | Admitting: Emergency Medicine

## 2017-12-28 NOTE — Telephone Encounter (Signed)
Spoke to patient whom stated that she is feeling much better

## 2018-01-03 ENCOUNTER — Encounter: Payer: Self-pay | Admitting: Family Medicine

## 2018-01-03 ENCOUNTER — Ambulatory Visit (INDEPENDENT_AMBULATORY_CARE_PROVIDER_SITE_OTHER): Payer: Self-pay | Admitting: Family Medicine

## 2018-01-03 VITALS — BP 110/80 | HR 57 | Temp 98.4°F | Wt 139.0 lb

## 2018-01-03 DIAGNOSIS — R448 Other symptoms and signs involving general sensations and perceptions: Secondary | ICD-10-CM

## 2018-01-03 DIAGNOSIS — J029 Acute pharyngitis, unspecified: Secondary | ICD-10-CM

## 2018-01-03 DIAGNOSIS — R0982 Postnasal drip: Secondary | ICD-10-CM

## 2018-01-03 DIAGNOSIS — R6889 Other general symptoms and signs: Secondary | ICD-10-CM

## 2018-01-03 DIAGNOSIS — H6983 Other specified disorders of Eustachian tube, bilateral: Secondary | ICD-10-CM

## 2018-01-03 MED ORDER — PREDNISONE 20 MG PO TABS: 40.0000 mg | ORAL_TABLET | Freq: Every day | ORAL | 0 refills | Status: AC

## 2018-01-03 MED ORDER — LEVOCETIRIZINE DIHYDROCHLORIDE 5 MG PO TABS
5.0000 mg | ORAL_TABLET | Freq: Every evening | ORAL | 0 refills | Status: DC
Start: 2018-01-03 — End: 2018-03-02

## 2018-01-03 MED ORDER — PREDNISONE 20 MG PO TABS: 40.0000 mg | ORAL_TABLET | Freq: Every day | ORAL | 0 refills | Status: DC

## 2018-01-03 MED ORDER — IPRATROPIUM BROMIDE 0.03 % NA SOLN: 2.0000 | Freq: Two times a day (BID) | NASAL | 0 refills | Status: DC

## 2018-01-03 NOTE — Progress Notes (Signed)
Patient ID: Stephanie Chambers, female    DOB: Jul 18, 1975, 42 y.o.   MRN: [id number]  PCP: Versie Starks, PA-C  No chief complaint on file.   Subjective:  HPI Stephanie Chambers is a 42 y.o. female presents for evaluation of ear pain, sore throat, headache, post nasal drainage. Recently treat with antibiotic therapy for sinusitis and right MEE on 12/26/2017 and initially had resolution of symptoms after a few days of antibiotic therapy. Current course of symptoms present 3-4 days. Facial pressure with frontal headache, feeling fatigue, persistent post nasal  drainage. Left ear pain radiating down her neck. No drainage. Sore throat began last night. No cough, shortness of breath, or chest congestion.She has remained afebrile. Continue to use Flonase and Claritin for allergy symptoms management without relief. Social History   Socioeconomic History  . Marital status: Married    Spouse name: Not on file  . Number of children: Not on file  . Years of education: Not on file  . Highest education level: Not on file  Occupational History  . Not on file  Social Needs  . Financial resource strain: Not on file  . Food insecurity:    Worry: Not on file    Inability: Not on file  . Transportation needs:    Medical: Not on file    Non-medical: Not on file  Tobacco Use  . Smoking status: Former Research scientist (life sciences)  . Smokeless tobacco: Never Used  Substance and Sexual Activity  . Alcohol use: Yes    Alcohol/week: 0.0 standard drinks    Comment: occas  . Drug use: No  . Sexual activity: Yes    Birth control/protection: None  Lifestyle  . Physical activity:    Days per week: 3 days    Minutes per session: 30 min  . Stress: Not on file  Relationships  . Social connections:    Talks on phone: Not on file    Gets together: Not on file    Attends religious service: Not on file    Active member of club or organization: Not on file    Attends meetings of clubs or organizations: Not on file   Relationship status: Not on file  . Intimate partner violence:    Fear of current or ex partner: Not on file    Emotionally abused: Not on file    Physically abused: Not on file    Forced sexual activity: Not on file  Other Topics Concern  . Not on file  Social History Narrative  . Not on file    Family History  Problem Relation Age of Onset  . Cancer Mother 62       breast with liver mets  . Breast cancer Mother   . Cancer Father 36       colon, lung, kidney  . Heart disease Father   . Colon cancer Father   . Depression Maternal Grandmother   . Cancer Maternal Grandmother        colon  . Colon cancer Maternal Grandmother   . Depression Paternal Grandmother   . Heart disease Paternal Grandfather   . Breast cancer Cousin   . Ovarian cancer Neg Hx    Review of Systems Pertinent negatives listed in HPI Patient Active Problem List   Diagnosis Date Noted  . Multiple joint pain 04/27/2017  . Other fatigue 04/27/2017  . Right upper quadrant pain 04/27/2017  . Hair loss 04/27/2017  . Family history of colon cancer in father 04/27/2017  .  Pain of right breast 02/12/2016  . Family history of breast cancer in mother 02/12/2016  . Cephalalgia 10/14/2015  . PMS (premenstrual syndrome) 10/14/2015  . History of anemia 04/19/2013    Allergies  Allergen Reactions  . Other Shortness Of Breath    omnicef    Prior to Admission medications   Medication Sig Start Date End Date Taking? Authorizing Provider  buPROPion (WELLBUTRIN SR) 150 MG 12 hr tablet Take 1 tablet (150 mg total) by mouth 2 (two) times daily. 10/06/17  Yes Defrancesco, Alanda Slim, MD  fluticasone (FLONASE) 50 MCG/ACT nasal spray Place 2 sprays into both nostrils daily. 12/26/17  Yes Scot Jun, FNP  loratadine (CLARITIN) 10 MG tablet Take 10 mg by mouth daily.   Yes [provider]  azithromycin (ZITHROMAX) 250 MG tablet Take 2 tabs PO x 1 dose, then 1 tab PO QD x 4 days Patient not taking: Reported on  01/03/2018 12/26/17   Scot Jun, FNP    Past Medical, Surgical Family and Social History reviewed and updated.    Objective:   Today's Vitals   01/03/18 0825  BP: 110/80  Pulse: (!) 57  Temp: 98.4 F (36.9 C)  SpO2: 98%  Weight: 139 lb (63 kg)    Wt Readings from Last 3 Encounters:  01/03/18 139 lb (63 kg)  12/26/17 141 lb (64 kg)  11/25/17 136 lb 7 oz (61.9 kg)   Physical Exam General Appearance:    Alert, cooperative, no distress  HENT: ENT exam normal, no neck nodes or sinus tenderness.   Eyes:    PERRL, conjunctiva/corneas clear, EOM's intact       Lungs:     Clear to auscultation bilaterally, respirations unlabored  Heart:    Regular rate and rhythm  Neurologic:   Awake, alert, oriented x 3. No apparent focal neurological           defect.      Assessment & Plan:  1. Facial pressure 2. Dysfunction of both eustachian tubes 3.Sore throat 4. Post-nasal drainage Recent antibiotic therapy with ZPAK only completed 4 days prior.  Will opt for not extending antibiotic therapy today. Will trial prednisone 40 mg x 3 days. Recommended 5 days, patient request  5 days. Discontinue Claritin. Start Levocetirizine 5 mg once daily. Start Atrovent nasal spray, 2 sprays per nares up to 3 times daily as needed.  Meds ordered this encounter  Medications  . levocetirizine (XYZAL) 5 MG tablet    Sig: Take 1 tablet (5 mg total) by mouth every evening.    Dispense:  30 tablet    Refill:  0  . ipratropium (ATROVENT) 0.03 % nasal spray    Sig: Place 2 sprays into both nostrils 2 (two) times daily.    Dispense:  30 mL    Refill:  0  . DISCONTD: predniSONE (DELTASONE) 20 MG tablet    Sig: Take 2 tablets (40 mg total) by mouth daily with breakfast for 5 days.    Dispense:  10 tablet    Refill:  0  . predniSONE (DELTASONE) 20 MG tablet    Sig: Take 2 tablets (40 mg total) by mouth daily with breakfast for 3 days.    Dispense:  6 tablet    Refill:  0    If symptoms worsen or  do not improve, return for follow-up, follow-up with PCP, or at the emergency department if severity of symptoms warrant a higher level of care.   Carroll Sage. Kenton Kingfisher,  MSN, FNP-C [facility]  [place], [place] [address] [phone]

## 2018-01-03 NOTE — Patient Instructions (Addendum)
Eustachian Tube Dysfunction The eustachian tube connects the middle ear to the back of the nose. It regulates air pressure in the middle ear by allowing air to move between the ear and nose. It also helps to drain fluid from the middle ear space. When the eustachian tube does not function properly, air pressure, fluid, or both can build up in the middle ear. Eustachian tube dysfunction can affect one or both ears. What are the causes? This condition happens when the eustachian tube becomes blocked or cannot open normally. This may result from:  Ear infections.  Colds and other upper respiratory infections.  Allergies.  Irritation, such as from cigarette smoke or acid from the stomach coming up into the esophagus (gastroesophageal reflux).  Sudden changes in air pressure, such as from descending in an airplane.  Abnormal growths in the nose or throat, such as nasal polyps, tumors, or enlarged tissue at the back of the throat (adenoids).  What increases the risk? This condition may be more likely to develop in people who smoke and people who are overweight. Eustachian tube dysfunction may also be more likely to develop in children, especially children who have:  Certain birth defects of the mouth, such as cleft palate.  Large tonsils and adenoids.  What are the signs or symptoms? Symptoms of this condition may include:  A feeling of fullness in the ear.  Ear pain.  Clicking or popping noises in the ear.  Ringing in the ear.  Hearing loss.  Loss of balance.  Symptoms may get worse when the air pressure around you changes, such as when you travel to an area of high elevation or fly on an airplane. How is this diagnosed? This condition may be diagnosed based on:  Your symptoms.  A physical exam of your ear, nose, and throat.  Tests, such as those that measure: ? The movement of your eardrum (tympanogram). ? Your hearing (audiometry).  How is this treated? Treatment  depends on the cause and severity of your condition. If your symptoms are mild, you may be able to relieve your symptoms by moving air into ("popping") your ears. If you have symptoms of fluid in your ears, treatment may include:  Decongestants.  Antihistamines.  Nasal sprays or ear drops that contain medicines that reduce swelling (steroids).  In some cases, you may need to have a procedure to drain the fluid in your eardrum (myringotomy). In this procedure, a small tube is placed in the eardrum to:  Drain the fluid.  Restore the air in the middle ear space.  Follow these instructions at home:  Take over-the-counter and prescription medicines only as told by your health care provider.  Use techniques to help pop your ears as recommended by your health care provider. These may include: ? Chewing gum. ? Yawning. ? Frequent, forceful swallowing. ? Closing your mouth, holding your nose closed, and gently blowing as if you are trying to blow air out of your nose.  Do not do any of the following until your health care provider approves: ? Travel to high altitudes. ? Fly in airplanes. ? Work in a pressurized cabin or room. ? Scuba dive.  Keep your ears dry. Dry your ears completely after showering or bathing.  Do not smoke.  Keep all follow-up visits as told by your health care provider. This is important. Contact a health care provider if:  Your symptoms do not go away after treatment.  Your symptoms come back after treatment.  You are   unable to pop your ears.  You have: ? A fever. ? Pain in your ear. ? Pain in your head or neck. ? Fluid draining from your ear.  Your hearing suddenly changes.  You become very dizzy.  You lose your balance. This information is not intended to replace advice given to you by your health care provider. Make sure you discuss any questions you have with your health care provider. Document Released: 05/02/2015 Document Revised: 09/11/2015  Document Reviewed: 04/24/2014 Elsevier Interactive Patient Education  2018 [employer]vier Inc[employer]  

## 2018-01-05 ENCOUNTER — Telehealth: Payer: Self-pay | Admitting: Emergency Medicine

## 2018-01-05 NOTE — Telephone Encounter (Signed)
Spoke with patient whom stated that she is getting better ears and face doesn't hurt anymore. But throat still hurts probably due to drainage  not sleeping well due to pred.

## 2018-03-02 ENCOUNTER — Encounter: Payer: Self-pay | Admitting: Physician Assistant

## 2018-03-02 ENCOUNTER — Ambulatory Visit (INDEPENDENT_AMBULATORY_CARE_PROVIDER_SITE_OTHER): Payer: Self-pay | Admitting: Physician Assistant

## 2018-03-02 VITALS — BP 110/70 | HR 79 | Temp 98.3°F | Wt 142.0 lb

## 2018-03-02 DIAGNOSIS — J0140 Acute pansinusitis, unspecified: Secondary | ICD-10-CM

## 2018-03-02 MED ORDER — IBUPROFEN 800 MG PO TABS: 800.0000 mg | ORAL_TABLET | Freq: Three times a day (TID) | ORAL | 0 refills | Status: DC | PRN

## 2018-03-02 MED ORDER — AMOXICILLIN-POT CLAVULANATE 875-125 MG PO TABS: 1.0000 | ORAL_TABLET | Freq: Two times a day (BID) | ORAL | 0 refills | Status: AC

## 2018-03-02 NOTE — Progress Notes (Addendum)
Patient ID: Stephanie Chambers DOB: 1975/08/13 AGE: 42 y.o. MRN: [id number]   PCP: Versie Starks, PA-C   Chief Complaint:  Chief Complaint  Patient presents with  . choice-facial pressure     Subjective:    HPI:  Stephanie Chambers is a 42 y.o. female presents for evaluation  Chief Complaint  Patient presents with  . choice-facial pressure    42 year old female presents with five day history of left sided maxillary sinus pain. Began with dizziness; mild room spinning sensation and imbalance sensation. Triggered by turning head quickly. Patient states she has frequent vertigo. Has been evaluated by ENT. Has Meclizine for prn usage; doesn't take during the day, due to side effect of somnolence. Associated left frontal sinus pain. Associated left ear pain. Reports nasal dryness. Has been using Flonase. Has taken OTC Advil Cold & Sinus with minimal relief. Denies fever, chills, ear discharge/drainage, sore throat, cough, chest pain, SOB, wheezing, nausea/vomiting.  A complete, at least 10 system review of symptoms was performed, pertinent positives and negatives as mentioned in HPI, otherwise negative.  The following portions of the patient's history were reviewed and updated as appropriate: allergies, current medications and past medical history.  Patient Active Problem List   Diagnosis Date Noted  . Multiple joint pain 04/27/2017  . Other fatigue 04/27/2017  . Right upper quadrant pain 04/27/2017  . Hair loss 04/27/2017  . Family history of colon cancer in father 04/27/2017  . Pain of right breast 02/12/2016  . Family history of breast cancer in mother 02/12/2016  . Cephalalgia 10/14/2015  . PMS (premenstrual syndrome) 10/14/2015  . History of anemia 04/19/2013    Allergies  Allergen Reactions  . Cefdinir Shortness Of Breath    Current Outpatient Medications on File Prior to Visit  Medication Sig Dispense Refill  . buPROPion (WELLBUTRIN SR) 150 MG 12 hr tablet Take 1  tablet (150 mg total) by mouth 2 (two) times daily. 120 tablet 6  . loratadine (CLARITIN) 10 MG tablet Take 10 mg by mouth daily.     No current facility-administered medications on file prior to visit.        Objective:   Vitals:   03/02/18 1206  BP: 110/70  Pulse: 79  Temp: 98.3 F (36.8 C)  SpO2: 98%     Wt Readings from Last 3 Encounters:  03/02/18 142 lb (64.4 kg)  01/03/18 139 lb (63 kg)  12/26/17 141 lb (64 kg)    Physical Exam:   General Appearance:  Alert, cooperative, appears stated age. In no acute distress. Afebrile.  Head:  Normocephalic, without obvious abnormality, atraumatic  Eyes:  PERRL, conjunctiva/corneas clear, EOM's intact, fundi benign, both eyes  Ears:  Normal TM's and external ear canals, both ears  Nose: Nares normal, septum midline. No discharge. Normal mucosa. No active bleeding. Tenderness with palpation in left frontal, ethmoid, and maxillary sinus.  Throat: Lips, mucosa, and tongue normal; teeth and gums normal. Throat reveals no erythema. Tonsils with no enlargement or exudate.  Neck: Supple, symmetrical, trachea midline, bilateral anterior cervical lymphadenopathy;  thyroid: not enlarged, symmetric, no tenderness/mass/nodules; no carotid bruit or JVD  Back:   Symmetric, no curvature, ROM normal, no CVA tenderness  Lungs:   Clear to auscultation bilaterally, respirations unlabored  Heart:  Regular rate and rhythm, S1 and S2 normal, no murmur, rub, or gallop  Abdomen:   Soft, non-tender, bowel sounds active all four quadrants,  no masses, no organomegaly  Extremities: Extremities normal, atraumatic,  no cyanosis or edema  Pulses: 2+ and symmetric  Skin: Skin color, texture, turgor normal, no rashes or lesions  Lymph nodes: Cervical, supraclavicular, and axillary nodes normal  Neurologic: Normal    Assessment & Plan:    Exam findings, diagnosis etiology and medication use and indications reviewed with patient. Follow-Up and discharge  instructions provided. No emergent/urgent issues found on exam.  Patient education was provided.   Patient verbalized understanding of information provided and agrees with plan of care (POC), all questions answered. The patient is advised to call or return to clinic if condition does not see an improvement in symptoms, or to seek the care of the closest emergency department if condition worsens with the below plan.    1. Acute non-recurrent pansinusitis  - amoxicillin-clavulanate (AUGMENTIN) 875-125 MG tablet; Take 1 tablet by mouth 2 (two) times daily for 10 days.  Dispense: 20 tablet; Refill: 0 - ibuprofen (ADVIL,MOTRIN) 800 MG tablet; Take 1 tablet (800 mg total) by mouth every 8 (eight) hours as needed.  Dispense: 30 tablet; Refill: 0  Patient with five day history of left frontal/maxillary sinus pain. Patient prescribed Augmentin for acute sinusitis. Discussed symptomatic treatment. Advised patient f/u with PCP if not better in a few days.  Addendum: Patient called on 03/06/2018, stating Augmentin caused abdominal pain and nausea. Patient discontinued medication due to adverse effects. Sinus symptoms improving. Will call in Amoxicillin to pharmacy instead. SFS PA-C.  Darlin Priestly, MHS, PA-C Montey Hora, MHS, PA-C Advanced Practice Provider [facility]  [address] [facility], [place], [place] [address] (p):  [phone] [email or web address]

## 2018-03-06 ENCOUNTER — Telehealth: Payer: Self-pay | Admitting: Emergency Medicine

## 2018-03-06 MED ORDER — AMOXICILLIN 875 MG PO TABS: 875.0000 mg | ORAL_TABLET | Freq: Two times a day (BID) | ORAL | 0 refills | Status: AC

## 2018-03-06 NOTE — Telephone Encounter (Signed)
If patient is feeling slightly better from few doses of Augmentin, can call in high dose Amoxicillin to pharmacy. If patient is feeling no better (in regards to sinus pain), can call in Doxycycline to pharmacy. However, Doxycycline can also be tough on the stomach.  Just let me know what the patient would prefer. Thanks, SFS PA-C

## 2018-03-06 NOTE — Telephone Encounter (Signed)
Patient informed me that augmentin helped slightly so she will settle for tha amoxicillin. Was informed this will be sent over to pharmacy

## 2018-03-06 NOTE — Addendum Note (Signed)
Addended by: Darlin Priestly on: 03/06/2018 01:00 PM   Modules accepted: Orders

## 2018-03-27 ENCOUNTER — Ambulatory Visit (INDEPENDENT_AMBULATORY_CARE_PROVIDER_SITE_OTHER): Payer: Self-pay | Admitting: Physician Assistant

## 2018-03-27 ENCOUNTER — Encounter: Payer: Self-pay | Admitting: Physician Assistant

## 2018-03-27 VITALS — BP 110/80 | HR 74 | Temp 98.2°F | Wt 145.0 lb

## 2018-03-27 DIAGNOSIS — H9202 Otalgia, left ear: Secondary | ICD-10-CM

## 2018-03-27 DIAGNOSIS — J069 Acute upper respiratory infection, unspecified: Secondary | ICD-10-CM

## 2018-03-27 LAB — POCT INFLUENZA A/B
Influenza A, POC: NEGATIVE
Influenza B, POC: NEGATIVE

## 2018-03-27 MED ORDER — LORATADINE-PSEUDOEPHEDRINE ER 10-240 MG PO TB24: 1.0000 | ORAL_TABLET | Freq: Every day | ORAL | 0 refills | Status: DC

## 2018-03-27 NOTE — Progress Notes (Signed)
Patient ID: Stephanie Chambers DOB: 07-17-1975 AGE: 42 y.o. MRN: [id number]   PCP: Versie Starks, PA-C   Chief Complaint:  Chief Complaint  Patient presents with  . Sore Throat    x2d  . Cough    x a.m  . Otalgia    x2d     Subjective:    HPI:  Stephanie Chambers is a 42 y.o. female presents for evaluation  Chief Complaint  Patient presents with  . Sore Throat    x2d  . Cough    x a.m  . Otalgia    x64d    42 year old female presents with two day history of URI symptoms. Began with morning sore throat. Then developed nasal congestion, bilateral maxillary sinus pressure/congestion, and left ear pain. Left ear pain severe. Intermittent. Felt clogged yesterday; better today. This morning developed cough. Coarse/productive. Associated chest tightness and hoarse voice. Patient reports bodyaches and malaise. Has taken OTC Advil Cold & Sinus and Nyquil with minimal relief. Took leftover Amoxicillin, with no improvement. Denies fever, chills, headache, ear discharge/drainage, current sore throat, chest pain, SOB, wheezing.   Patient did receive this season's influenza vaccination.  Patient treated for sinusitis with Z-pak on 12/26/2017. Returned to [place] on 01/03/2018 with similar complaints. Prescribed short course of prednisone. Patient treated at [facility] on 03/02/2018, less than one month ago, for sinusitis with Augmentin (caused stomach discomfort, switched to Amoxicillin).  A complete, at least 10 system review of symptoms was performed, pertinent positives and negatives as mentioned in HPI.  The following portions of the patient's history were reviewed and updated as appropriate: allergies, current medications and past medical history.  Patient Active Problem List   Diagnosis Date Noted  . Multiple joint pain 04/27/2017  . Other fatigue 04/27/2017  . Right upper quadrant pain 04/27/2017  . Hair loss 04/27/2017  . Family history of colon cancer in father 04/27/2017   . Pain of right breast 02/12/2016  . Family history of breast cancer in mother 02/12/2016  . Cephalalgia 10/14/2015  . PMS (premenstrual syndrome) 10/14/2015  . History of anemia 04/19/2013    Allergies  Allergen Reactions  . Cefdinir Shortness Of Breath    Current Outpatient Medications on File Prior to Visit  Medication Sig Dispense Refill  . buPROPion (WELLBUTRIN SR) 150 MG 12 hr tablet Take 1 tablet (150 mg total) by mouth 2 (two) times daily. 120 tablet 6  . ibuprofen (ADVIL,MOTRIN) 800 MG tablet Take 1 tablet (800 mg total) by mouth every 8 (eight) hours as needed. 30 tablet 0   No current facility-administered medications on file prior to visit.        Objective:   Vitals:   03/27/18 0839  BP: 110/80  Pulse: 74  Temp: 98.2 F (36.8 C)  SpO2: 99%     Wt Readings from Last 3 Encounters:  03/27/18 145 lb (65.8 kg)  03/02/18 142 lb (64.4 kg)  01/03/18 139 lb (63 kg)    Physical Exam:   General Appearance:  Alert, cooperative, appears stated age. In no acute distress. Afebrile.  Head:  Normocephalic, without obvious abnormality, atraumatic  Eyes:  PERRL, conjunctiva/corneas clear, EOM's intact  Ears:  Bilateral ear canals WNL. Bilateral TMs WNL. No erythema. No injection. No significant visible effusion/bulging. No discharge/drainage.  Nose: Nares normal, septum midline. No discharge. Normal mucosa. No sinus tenderness with percussion/palpation. Subjective bilateral maxillary sinus pressure/congestion (mild).  Throat: Lips, mucosa, and tongue normal; teeth and gums normal. Throat reveals  no erythema.   Neck: Supple, symmetrical, trachea midline, bilateral anterior cervical lymphdenopathy  Lungs:   Clear to auscultation bilaterally, respirations unlabored. Good aeration. No wheezing. No cough elicited with deep inspiration or forced expiration. No wheezing with forced expiration.  Heart:  Regular rate and rhythm, S1 and S2 normal, no murmur, rub, or gallop   Extremities: Extremities normal, atraumatic, no cyanosis or edema  Pulses: 2+ and symmetric  Skin: Skin color, texture, turgor normal, no rashes or lesions  Lymph nodes: Cervical, supraclavicular, and axillary nodes normal  Neurologic: Normal    Assessment & Plan:    Exam findings, diagnosis etiology and medication use and indications reviewed with patient. Follow-Up and discharge instructions provided. No emergent/urgent issues found on exam.  Patient education was provided.   Patient verbalized understanding of information provided and agrees with plan of care (POC), all questions answered. The patient is advised to call or return to clinic if condition does not see an improvement in symptoms, or to seek the care of the closest emergency department if condition worsens with the below plan.    1. Acute otalgia, left  2. Upper respiratory tract infection, unspecified type  Patient with 2-3 day history of URI symptoms. Typical course. Negative rapid flu test. Benign physical examination. Patient's primary complaint body aches and left ear pain; advised antihistamine with decongestant and steroid nasal spray for left serous effusion. Discussed inappropriate use of antibiotics; not indicated at this time, leftover antibiotics should be disposed of. Advised patient return to clinic in 4-5 days if not improving, sooner with any worsening symptoms.    Darlin Priestly, MHS, PA-C Montey Hora, MHS, PA-C Advanced Practice Provider [facility]  [address] [facility], [place], [place] [address] (p):  [phone] [email or web address]

## 2018-03-27 NOTE — Patient Instructions (Addendum)
Thank you for choosing [facility] for your health care needs.  You have been diagnosed with an upper respiratory infection (a cold).  Your flu test performed in the clinic was NEGATIVE.  Increase fluids. Rest. Recommend you use an over the counter antihistamine with a decongestant, such as Claritin-D or Zyrtec-D. Use Flonase nasal spray, one spray in each nostril, twice a day.  May take over the counter Tylenol or ibuprofen for pain.  Return to [place] in 4-5 days if symptoms not improving. Sooner with any worsening symptoms.  Upper Respiratory Infection, Adult Most upper respiratory infections (URIs) are caused by a virus. A URI affects the nose, throat, and upper air passages. The most common type of URI is often called "the common cold." Follow these instructions at home:  Take medicines only as told by your doctor.  Gargle warm saltwater or take cough drops to comfort your throat as told by your doctor.  Use a warm mist humidifier or inhale steam from a shower to increase air moisture. This may make it easier to breathe.  Drink enough fluid to keep your pee (urine) clear or pale yellow.  Eat soups and other clear broths.  Have a healthy diet.  Rest as needed.  Go back to work when your fever is gone or your doctor says it is okay. ? You may need to stay home longer to avoid giving your URI to others. ? You can also wear a face mask and wash your hands often to prevent spread of the virus.  Use your inhaler more if you have asthma.  Do not use any tobacco products, including cigarettes, chewing tobacco, or electronic cigarettes. If you need help quitting, ask your doctor. Contact a doctor if:  You are getting worse, not better.  Your symptoms are not helped by medicine.  You have chills.  You are getting more short of breath.  You have brown or red mucus.  You have yellow or brown discharge from your nose.  You have pain in your face, especially when you bend  forward.  You have a fever.  You have puffy (swollen) neck glands.  You have pain while swallowing.  You have white areas in the back of your throat. Get help right away if:  You have very bad or constant: ? Headache. ? Ear pain. ? Pain in your forehead, behind your eyes, and over your cheekbones (sinus pain). ? Chest pain.  You have long-lasting (chronic) lung disease and any of the following: ? Wheezing. ? Long-lasting cough. ? Coughing up blood. ? A change in your usual mucus.  You have a stiff neck.  You have changes in your: ? Vision. ? Hearing. ? Thinking. ? Mood. This information is not intended to replace advice given to you by your health care provider. Make sure you discuss any questions you have with your health care provider. Document Released: 09/22/2007 Document Revised: 12/07/2015 Document Reviewed: 07/11/2013 Elsevier Interactive Patient Education  2018 [employer].

## 2018-03-29 ENCOUNTER — Telehealth: Payer: Self-pay | Admitting: Emergency Medicine

## 2018-03-29 NOTE — Telephone Encounter (Signed)
Spoke with patient stated that she is a little better not 100% as of yet but know that it takes time. Also ear doesn't hurt as bad and body ache has gone Stayed out of work yesterday.

## 2018-03-30 ENCOUNTER — Ambulatory Visit
Admission: RE | Admit: 2018-03-30 | Discharge: 2018-03-30 | Disposition: A | Discharge status: Home / Self Care | Payer: 59 | Source: Ambulatory Visit | Attending: Obstetrics and Gynecology | Admitting: Obstetrics and Gynecology

## 2018-03-30 ENCOUNTER — Other Ambulatory Visit: Payer: Self-pay | Admitting: Obstetrics and Gynecology

## 2018-03-30 DIAGNOSIS — R928 Other abnormal and inconclusive findings on diagnostic imaging of breast: Secondary | ICD-10-CM

## 2018-03-30 DIAGNOSIS — Z1231 Encounter for screening mammogram for malignant neoplasm of breast: Secondary | ICD-10-CM | POA: Diagnosis not present

## 2018-03-30 DIAGNOSIS — Z1239 Encounter for other screening for malignant neoplasm of breast: Secondary | ICD-10-CM | POA: Insufficient documentation

## 2018-03-30 DIAGNOSIS — N6489 Other specified disorders of breast: Secondary | ICD-10-CM

## 2018-04-03 ENCOUNTER — Encounter: Payer: Self-pay | Admitting: Physician Assistant

## 2018-04-03 ENCOUNTER — Ambulatory Visit (INDEPENDENT_AMBULATORY_CARE_PROVIDER_SITE_OTHER): Payer: Self-pay | Admitting: Physician Assistant

## 2018-04-03 VITALS — BP 120/80 | HR 87 | Temp 98.7°F | Resp 16 | Wt 145.0 lb

## 2018-04-03 DIAGNOSIS — J069 Acute upper respiratory infection, unspecified: Secondary | ICD-10-CM

## 2018-04-03 DIAGNOSIS — H6982 Other specified disorders of Eustachian tube, left ear: Secondary | ICD-10-CM

## 2018-04-03 MED ORDER — PREDNISONE 20 MG PO TABS: 40.0000 mg | ORAL_TABLET | Freq: Every day | ORAL | 0 refills | Status: AC

## 2018-04-03 MED ORDER — LORATADINE 10 MG PO TABS: 10.0000 mg | ORAL_TABLET | Freq: Every day | ORAL | 0 refills | Status: DC

## 2018-04-03 MED ORDER — MUCINEX DM MAXIMUM STRENGTH 60-1200 MG PO TB12: 1.0000 | ORAL_TABLET | ORAL | 0 refills | Status: DC

## 2018-04-03 NOTE — Progress Notes (Signed)
MRN: [id number] DOB: 10-08-1975  Subjective:   Stephanie Chambers is a 42 y.o. female presenting for chief complaint of Sinus Problem (x10d) .  Reports 10 day history of head congestion. Most concerning sx is left ear feeling clogged. Has associated post nasal drainage that she feels like is very thick and will not clear easily. Has some mild left sided facial discomfort, scratchy throat, and infrequent dry cough. Notes she does not really "feel sick," just wishes she could get some relief with congestion. Has been taking claritin-d and it was helping a lot but it was keeping her up at night time. Continuing nasonex, which helps. Tried the neti pot but could not get it to work. Denies fever, visual disturbance, dental pain, ear drainage, difficulty swallowing, wheezing, shortness of breath, chest tightness, chest pain and myalgia, night sweats, chills, fatigue, malaise, decreased appetite, nausea, vomiting, abdominal pain and diarrhea. No known sick exposure. No known history of seasonal allergies or asthma. Patient has had flu shot this season. Denies smoking.     Of note, patient treated for sinusitis with Z-pak on 12/26/2017. Returned to [facility] on 01/03/2018 with similar complaints. Prescribed short course of prednisone, which she notes worked. Patient treated at [facility] on 03/02/2018, less than one month ago, for sinusitis with Augmentin (caused stomach discomfort, switched to Amoxicillin). Pt treated at [facility] for URI with care on 03/27/18.  Denies any other aggravating or relieving factors, no other questions or concerns.  ROS per HPI  Stephanie Chambers has a current medication list which includes the following prescription(s): bupropion, ibuprofen, loratadine-pseudoephedrine, and mometasone. Also is allergic to cefdinir.  Stephanie Chambers  has a past medical history of Anemia, Anxiety, Bulging of cervical intervertebral disc, Constipation, Depression, DUB (dysfunctional uterine bleeding), Endometrial  polyp (05/2012), Epigastric abdominal pain, Lump in female breast, Mastodynia, Menorrhagia, and UTI (lower urinary tract infection). Also  has a past surgical history that includes Tonsillectomy (1998); Diagnostic laparoscopy (2004); Hysteroscopy; and Dilation and curettage of uterus.   Objective:   Vitals: BP 120/80   Pulse 87   Temp 98.7 F (37.1 C)   Wt 145 lb (65.8 kg)   LMP 03/06/2018   SpO2 98%   BMI 23.40 kg/m   Physical Exam Vitals signs reviewed.  Constitutional:      General: She is not in acute distress.    Appearance: Normal appearance. She is well-developed and well-groomed.  HENT:     Head: Normocephalic and atraumatic.     Right Ear: Ear canal and external ear normal. A middle ear effusion is present. Tympanic membrane is not erythematous or bulging.     Left Ear: Ear canal and external ear normal. A middle ear effusion is present. Tympanic membrane is not erythematous or bulging.     Nose: Mucosal edema present.     Right Turbinates: Enlarged.     Left Turbinates: Enlarged.     Right Sinus: No maxillary sinus tenderness or frontal sinus tenderness.     Left Sinus: No maxillary sinus tenderness or frontal sinus tenderness.     Mouth/Throat:     Lips: Pink.     Mouth: Mucous membranes are moist.     Pharynx: Uvula midline. Posterior oropharyngeal erythema present.     Tonsils: No tonsillar exudate or tonsillar abscesses.  Eyes:     Extraocular Movements: Extraocular movements intact.     Conjunctiva/sclera: Conjunctivae normal.     Pupils: Pupils are equal, round, and reactive to light.  Neck:     Musculoskeletal: Normal  range of motion.  Cardiovascular:     Rate and Rhythm: Normal rate and regular rhythm.     Heart sounds: Normal heart sounds.  Pulmonary:     Effort: Pulmonary effort is normal.     Breath sounds: Normal breath sounds. No decreased breath sounds, wheezing, rhonchi or rales.  Lymphadenopathy:     Head:     Right side of head: No submental,  submandibular, tonsillar, preauricular, posterior auricular or occipital adenopathy.     Left side of head: No submental, submandibular, tonsillar, preauricular, posterior auricular or occipital adenopathy.     Cervical: No cervical adenopathy.     Upper Body:     Right upper body: No supraclavicular adenopathy.     Left upper body: No supraclavicular adenopathy.  Skin:    General: Skin is warm and dry.  Neurological:     Mental Status: She is alert and oriented to person, place, and time.     No results found for this or any previous visit (from the past 24 hour(s)).  Assessment and Plan :  1. Upper respiratory tract infection, unspecified type Pt is overall well appearing, NAD. Vitals stable. Suspect ETD from URI. Believe she would benefit from short course steroid Rx at this time, along with other supportive tx measures. Do not believe abx would be beneficial at this time due to her hx and PE findings. Would consider Rx for abx if no improvement in 3 days with current tx plan. Due to frequent recurrences of similar sx w/in the past 3 months, would suggest f/u with ENT. Pt is established an [facility] and notes she will make an appointment with them in the future.  - predniSONE (DELTASONE) 20 MG tablet; Take 2 tablets (40 mg total) by mouth daily with breakfast for 5 days.  Dispense: 10 tablet; Refill: 0 - Dextromethorphan-guaiFENesin (MUCINEX DM MAXIMUM STRENGTH) 60-1200 MG TB12; Take 1 tablet by mouth daily.  Dispense: 20 each; Refill: 0 - loratadine (CLARITIN) 10 MG tablet; Take 1 tablet (10 mg total) by mouth daily.  Dispense: 30 tablet; Refill: 0  2. Dysfunction of left eustachian tube   Side effects, risks, benefits, and alternatives of the medications and treatment plan prescribed today were discussed, and patient expressed understanding of the instructions given. No barriers to understanding were identified. Red flags discussed in detail. Pt expressed understanding regarding what to  do in case of emergency/urgent symptoms.    Tenna Delaine, Hershal Coria  [facility] 04/03/2018 11:13 AM

## 2018-04-03 NOTE — Patient Instructions (Addendum)
We are going to treat your underlying inflammation with oral prednisone. Prednisone is a steroid and can cause side effects such as headache, irritability, nausea, vomiting, increased heart rate, increased blood pressure, increased blood sugar, appetite changes, and insomnia. Please take tablets in the morning with a full meal to help decrease the chances of these side effects.   Stop Claritin-D. Start claritin and Mucinex-D. Continue nasal saline rinse, keep working on the neti pot!   If no improvement in 3 days, please contact our office and we will consider prescription for antibiotics at this time. However, due to the frequency of occurrences, I suggest following up with ENT for further evaluation.     Eustachian Tube Dysfunction The eustachian tube connects the middle ear to the back of the nose. It regulates air pressure in the middle ear by allowing air to move between the ear and nose. It also helps to drain fluid from the middle ear space. When the eustachian tube does not function properly, air pressure, fluid, or both can build up in the middle ear. Eustachian tube dysfunction can affect one or both ears. What are the causes? This condition happens when the eustachian tube becomes blocked or cannot open normally. This may result from:  Ear infections.  Colds and other upper respiratory infections.  Allergies.  Irritation, such as from cigarette smoke or acid from the stomach coming up into the esophagus (gastroesophageal reflux).  Sudden changes in air pressure, such as from descending in an airplane.  Abnormal growths in the nose or throat, such as nasal polyps, tumors, or enlarged tissue at the back of the throat (adenoids).  What increases the risk? This condition may be more likely to develop in people who smoke and people who are overweight. Eustachian tube dysfunction may also be more likely to develop in children, especially children who have:  Certain birth defects of  the mouth, such as cleft palate.  Large tonsils and adenoids.  What are the signs or symptoms? Symptoms of this condition may include:  A feeling of fullness in the ear.  Ear pain.  Clicking or popping noises in the ear.  Ringing in the ear.  Hearing loss.  Loss of balance.  Symptoms may get worse when the air pressure around you changes, such as when you travel to an area of high elevation or fly on an airplane. How is this diagnosed? This condition may be diagnosed based on:  Your symptoms.  A physical exam of your ear, nose, and throat.  Tests, such as those that measure: ? The movement of your eardrum (tympanogram). ? Your hearing (audiometry).  How is this treated? Treatment depends on the cause and severity of your condition. If your symptoms are mild, you may be able to relieve your symptoms by moving air into ("popping") your ears. If you have symptoms of fluid in your ears, treatment may include:  Decongestants.  Antihistamines.  Nasal sprays or ear drops that contain medicines that reduce swelling (steroids).  In some cases, you may need to have a procedure to drain the fluid in your eardrum (myringotomy). In this procedure, a small tube is placed in the eardrum to:  Drain the fluid.  Restore the air in the middle ear space.  Follow these instructions at home:  Take over-the-counter and prescription medicines only as told by your health care provider.  Use techniques to help pop your ears as recommended by your health care provider. These may include: ? Chewing gum. ? Yawning. ?  Frequent, forceful swallowing. ? Closing your mouth, holding your nose closed, and gently blowing as if you are trying to blow air out of your nose.  Do not do any of the following until your health care provider approves: ? Travel to high altitudes. ? Fly in airplanes. ? Work in a Pension scheme manager or room. ? Scuba dive.  Keep your ears dry. Dry your ears completely  after showering or bathing.  Do not smoke.  Keep all follow-up visits as told by your health care provider. This is important. Contact a health care provider if:  Your symptoms do not go away after treatment.  Your symptoms come back after treatment.  You are unable to pop your ears.  You have: ? A fever. ? Pain in your ear. ? Pain in your head or neck. ? Fluid draining from your ear.  Your hearing suddenly changes.  You become very dizzy.  You lose your balance. This information is not intended to replace advice given to you by your health care provider. Make sure you discuss any questions you have with your health care provider. Document Released: 05/02/2015 Document Revised: 09/11/2015 Document Reviewed: 04/24/2014 Elsevier Interactive Patient Education  [employer].

## 2018-04-05 ENCOUNTER — Telehealth: Payer: Self-pay | Admitting: Emergency Medicine

## 2018-04-05 NOTE — Telephone Encounter (Signed)
Spoke with patient whom stated that she is feeling so much better. This was a follow up call from visit with [facility]

## 2018-04-17 ENCOUNTER — Ambulatory Visit
Admission: RE | Admit: 2018-04-17 | Discharge: 2018-04-17 | Disposition: A | Discharge status: Home / Self Care | Payer: 59 | Source: Ambulatory Visit | Attending: Obstetrics and Gynecology | Admitting: Obstetrics and Gynecology

## 2018-04-17 ENCOUNTER — Other Ambulatory Visit: Payer: Self-pay | Admitting: Obstetrics and Gynecology

## 2018-04-17 DIAGNOSIS — R928 Other abnormal and inconclusive findings on diagnostic imaging of breast: Secondary | ICD-10-CM

## 2018-04-17 DIAGNOSIS — R922 Inconclusive mammogram: Secondary | ICD-10-CM | POA: Diagnosis not present

## 2018-04-17 DIAGNOSIS — N6489 Other specified disorders of breast: Secondary | ICD-10-CM

## 2018-04-18 ENCOUNTER — Telehealth: Payer: Self-pay | Admitting: Obstetrics and Gynecology

## 2018-04-18 NOTE — Telephone Encounter (Signed)
Dr. Marcelline Mates has already placed the orders for the mammogram.

## 2018-04-18 NOTE — Telephone Encounter (Signed)
Patient is requesting a refill of her lorazepam. Dr. Keturah Barre has prescribed in the past. Patient does not have a PCP. She said that her nerves are tore up due to having to have repeat mammogram and her mother had passed from breast cancer 8 months ago. She is going to start seeing you as her doctor.

## 2018-04-18 NOTE — Telephone Encounter (Signed)
The patient called and stated that she would like to speak with her nurse in regards to an abnormal mammogram that she had recently and is now needing new orders put in, in order for her to do further imaging/testing. Please advise.

## 2018-04-18 NOTE — Telephone Encounter (Signed)
Patient called requesting a refill on lorazepam. She stated she has a lot going on and her mother recently passed away from breast cancer. Thanks

## 2018-04-20 MED ORDER — LORAZEPAM 1 MG PO TABS: 1.0000 mg | ORAL_TABLET | ORAL | 0 refills | Status: DC | PRN

## 2018-04-20 NOTE — Telephone Encounter (Signed)
Waiting for Dr. Amalia Hailey to sign.

## 2018-04-21 NOTE — Telephone Encounter (Signed)
Notified patient that prescription was ready at the front for pickup.

## 2018-04-24 ENCOUNTER — Ambulatory Visit
Admission: RE | Admit: 2018-04-24 | Discharge: 2018-04-24 | Disposition: A | Discharge status: Home / Self Care | Payer: 59 | Source: Ambulatory Visit | Attending: Obstetrics and Gynecology | Admitting: Obstetrics and Gynecology

## 2018-04-24 DIAGNOSIS — R928 Other abnormal and inconclusive findings on diagnostic imaging of breast: Secondary | ICD-10-CM | POA: Diagnosis not present

## 2018-04-24 DIAGNOSIS — N6081 Other benign mammary dysplasias of right breast: Secondary | ICD-10-CM | POA: Diagnosis not present

## 2018-04-24 HISTORY — PX: BREAST BIOPSY: SHX20

## 2018-04-25 ENCOUNTER — Telehealth: Payer: Self-pay | Admitting: Obstetrics and Gynecology

## 2018-04-25 ENCOUNTER — Inpatient Hospital Stay: Payer: 59 | Attending: Oncology

## 2018-04-25 ENCOUNTER — Inpatient Hospital Stay (HOSPITAL_BASED_OUTPATIENT_CLINIC_OR_DEPARTMENT_OTHER): Payer: 59 | Admitting: Oncology

## 2018-04-25 DIAGNOSIS — R928 Other abnormal and inconclusive findings on diagnostic imaging of breast: Secondary | ICD-10-CM

## 2018-04-25 DIAGNOSIS — N6489 Other specified disorders of breast: Secondary | ICD-10-CM

## 2018-04-25 DIAGNOSIS — Z791 Long term (current) use of non-steroidal anti-inflammatories (NSAID): Secondary | ICD-10-CM

## 2018-04-25 DIAGNOSIS — Z87891 Personal history of nicotine dependence: Secondary | ICD-10-CM

## 2018-04-25 DIAGNOSIS — Z8249 Family history of ischemic heart disease and other diseases of the circulatory system: Secondary | ICD-10-CM

## 2018-04-25 DIAGNOSIS — Z803 Family history of malignant neoplasm of breast: Secondary | ICD-10-CM

## 2018-04-25 DIAGNOSIS — Z79899 Other long term (current) drug therapy: Secondary | ICD-10-CM

## 2018-04-25 LAB — SURGICAL PATHOLOGY

## 2018-04-25 NOTE — Telephone Encounter (Signed)
Referral was sent in.

## 2018-04-25 NOTE — Telephone Encounter (Signed)
Stephanie Chambers from [facility] called stating the patient needs a referral entered for a surgeon. She requests Dr Donne Hazel at [facility]. The report is in [place].

## 2018-04-28 NOTE — Progress Notes (Signed)
[place]  Telephone:([phone]) [phone] Fax:(336) [phone]  ID: Loa Socks OB: 06/30/1975  MR#: [id number]  YPP#:[id number]  Patient Care Team: Weldon Inches as PCP - General (Physician Assistant) Dallas Schimke, MD (Internal Medicine) Christene Lye, MD (General Surgery)  CHIEF COMPLAINT: Genetic testing and counseling.  INTERVAL HISTORY: Patient is a 43 year old female with extensive family history of malignancy who recently had breast biopsy with resulting pathology revealing focal sclerosing adenitis.  She currently feels well and is asymptomatic.  She has no neurological complaints.  She denies any recent fevers or illnesses.  She has a good appetite and denies weight loss.  She has no chest pain or shortness of breath.  She denies any nausea, vomiting, constipation, or diarrhea.  She has no urinary complaints.  Patient feels at her baseline offers no specific complaints today.  REVIEW OF SYSTEMS:   Review of Systems  Constitutional: Negative.  Negative for fever, malaise/fatigue and weight loss.  Respiratory: Negative.  Negative for cough, hemoptysis and shortness of breath.   Cardiovascular: Negative.  Negative for chest pain and leg swelling.  Gastrointestinal: Negative.  Negative for abdominal pain.  Genitourinary: Negative.  Negative for dysuria.  Musculoskeletal: Negative.  Negative for back pain.  Skin: Negative.  Negative for rash.  Neurological: Negative.  Negative for focal weakness, weakness and headaches.  Endo/Heme/Allergies: Negative.  Does not bruise/bleed easily.  Psychiatric/Behavioral: Negative.  The patient is not nervous/anxious.     As per HPI. Otherwise, a complete review of systems is negative.  PAST MEDICAL HISTORY: Past Medical History:  Diagnosis Date  . Anemia   . Anxiety   . Bulging of cervical intervertebral disc   . Constipation   . Depression    major  . DUB (dysfunctional uterine bleeding)   .  Endometrial polyp 05/2012   hysteroscopy w/ d &C  . Epigastric abdominal pain   . Lump in female breast   . Mastodynia   . Menorrhagia   . UTI (lower urinary tract infection)     PAST SURGICAL HISTORY: Past Surgical History:  Procedure Laterality Date  . BREAST BIOPSY Right 04/24/2018   Affirm Bx- X-clip, path pending  . DIAGNOSTIC LAPAROSCOPY  2004  . DILATION AND CURETTAGE OF UTERUS    . HYSTEROSCOPY    . TONSILLECTOMY  1998    FAMILY HISTORY: Family History  Problem Relation Age of Onset  . Cancer Mother 82       breast with liver mets  . Breast cancer Mother   . Cancer Father 47       colon, lung, kidney  . Heart disease Father   . Colon cancer Father   . Depression Maternal Grandmother   . Cancer Maternal Grandmother        colon  . Colon cancer Maternal Grandmother   . Depression Paternal Grandmother   . Heart disease Paternal Grandfather   . Breast cancer Cousin   . Ovarian cancer Neg Hx     ADVANCED DIRECTIVES (Y/N):  N  HEALTH MAINTENANCE: Social History   Tobacco Use  . Smoking status: Former Research scientist (life sciences)  . Smokeless tobacco: Never Used  Substance Use Topics  . Alcohol use: Yes    Alcohol/week: 0.0 standard drinks    Comment: occas  . Drug use: No     Colonoscopy:  PAP:  Bone density:  Lipid panel:  Allergies  Allergen Reactions  . Cefdinir Shortness Of Breath    Current Outpatient Medications  Medication  Sig Dispense Refill  . buPROPion (WELLBUTRIN SR) 150 MG 12 hr tablet Take 1 tablet (150 mg total) by mouth 2 (two) times daily. 120 tablet 6  . Dextromethorphan-guaiFENesin (MUCINEX DM MAXIMUM STRENGTH) 60-1200 MG TB12 Take 1 tablet by mouth daily. 20 each 0  . ibuprofen (ADVIL,MOTRIN) 800 MG tablet Take 1 tablet (800 mg total) by mouth every 8 (eight) hours as needed. 30 tablet 0  . loratadine (CLARITIN) 10 MG tablet Take 1 tablet (10 mg total) by mouth daily. 30 tablet 0  . LORazepam (ATIVAN) 1 MG tablet Take 1 tablet (1 mg total) by mouth  as needed for anxiety. 30 tablet 0  . mometasone (NASONEX) 50 MCG/ACT nasal spray Place 2 sprays into the nose daily.     No current facility-administered medications for this visit.     OBJECTIVE: There were no vitals filed for this visit.   There is no height or weight on file to calculate BMI.    ECOG FS:0 - Asymptomatic  General: Well-developed, well-nourished, no acute distress. Eyes: Pink conjunctiva, anicteric sclera. HEENT: Normocephalic, moist mucous membranes. Musculoskeletal: No edema, cyanosis, or clubbing. Neuro: Alert, answering all questions appropriately. Cranial nerves grossly intact. Skin: No rashes or petechiae noted. Psych: Normal affect.  LAB RESULTS:  Lab Results  Component Value Date   NA 141 11/16/2017   K 4.3 11/16/2017   CL 103 11/16/2017   CO2 22 11/16/2017   GLUCOSE 90 11/16/2017   BUN 17 11/16/2017   CREATININE 0.95 11/16/2017   CALCIUM 9.6 11/16/2017   PROT 7.0 11/16/2017   ALBUMIN 4.8 11/16/2017   AST 14 11/16/2017   ALT 10 11/16/2017   ALKPHOS 58 11/16/2017   BILITOT <0.2 11/16/2017   GFRNONAA 74 11/16/2017   GFRAA 85 11/16/2017    Lab Results  Component Value Date   WBC 4.2 11/16/2017   NEUTROABS 2.3 11/16/2017   HGB 11.6 11/16/2017   HCT 35.3 11/16/2017   MCV 92 11/16/2017   PLT 234 11/16/2017     STUDIES: Mm Digital Screening Bilateral  Result Date: 03/30/2018 CLINICAL DATA:  Screening. EXAM: DIGITAL SCREENING BILATERAL MAMMOGRAM WITH CAD COMPARISON:  Previous exam(s). ACR Breast Density Category c: The breast tissue is heterogeneously dense, which may obscure small masses. FINDINGS: In the right breast an asymmetry requires further evaluation. In the left breast an asymmetry requires further evaluation. Images were processed with CAD. IMPRESSION: Further evaluation is suggested for possible asymmetry in the right breast. Further evaluation is suggested for possible asymmetry in the left breast. RECOMMENDATION: Diagnostic  mammogram and possibly ultrasound of both breasts. (Code:[id number]) The patient will be contacted regarding the findings, and additional imaging will be scheduled. BI-RADS CATEGORY  0: Incomplete. Need additional imaging evaluation and/or prior mammograms for comparison. Electronically Signed   By: Dorise Bullion III M.D   On: 03/30/2018 13:01   US Breast Ltd Uni Right Inc Axilla  Result Date: 04/17/2018 CLINICAL DATA:  43 year old patient recalled from recent 2D screening mammogram for evaluation of a possible asymmetry in the right breast and possible asymmetry in the left breast. Her mother recently passed away from breast cancer. The patient has never had surgery in the right breast. EXAM: DIGITAL DIAGNOSTIC BILATERAL MAMMOGRAM WITH CAD AND TOMO ULTRASOUND RIGHT BREAST COMPARISON:  March 30, 2018 and earlier priors ACR Breast Density Category c: The breast tissue is heterogeneously dense, which may obscure small masses. FINDINGS: Whole breast CC and MLO views of the left breast show no persistent asymmetry, mass, or  distortion. In the upper inner right breast, the asymmetry identified on the screening mammogram correlates with a mole on the right breast, as confirmed with a mole marker placed for today's images. On today's tomographic images, a subtle area of suspected architectural distortion is detected in the medial right breast near the level of the nipple, middle third of the breast parenchyma, that is not seen in the 2D images. Additional spot compression views with tomography and a 90 degree lateral view with tomography confirm the presence of architectural distortion without a discrete central mass. The distortion is best seen in the CC projection. Mammographic images were processed with CAD. On physical exam, no mass is palpated in the medial right breast. Targeted ultrasound is performed, showing normal fibroglandular tissue in the medial right breast, with specific attention to the 3 o'clock  region near the level of the nipple. Negative for right axillary lymphadenopathy. IMPRESSION: 1. Architectural distortion in the medial right breast, 3 o'clock region. Findings are most suggestive of a complex sclerosing lesion. Malignancy can not be excluded. 2. No evidence of malignancy in the left breast. 3. Normal right axillary lymph nodes. RECOMMENDATION: Stereotactic biopsy of the right breast architectural distortion is recommended and will be scheduled for the patient. I have discussed the findings and recommendations with the patient. Results were also provided in writing at the conclusion of the visit. If applicable, a reminder letter will be sent to the patient regarding the next appointment. BI-RADS CATEGORY  4: Suspicious. Electronically Signed   By: Curlene Dolphin M.D.   On: 04/17/2018 15:04   Mm Diag Breast Tomo Bilateral  Result Date: 04/17/2018 CLINICAL DATA:  43 year old patient recalled from recent 2D screening mammogram for evaluation of a possible asymmetry in the right breast and possible asymmetry in the left breast. Her mother recently passed away from breast cancer. The patient has never had surgery in the right breast. EXAM: DIGITAL DIAGNOSTIC BILATERAL MAMMOGRAM WITH CAD AND TOMO ULTRASOUND RIGHT BREAST COMPARISON:  March 30, 2018 and earlier priors ACR Breast Density Category c: The breast tissue is heterogeneously dense, which may obscure small masses. FINDINGS: Whole breast CC and MLO views of the left breast show no persistent asymmetry, mass, or distortion. In the upper inner right breast, the asymmetry identified on the screening mammogram correlates with a mole on the right breast, as confirmed with a mole marker placed for today's images. On today's tomographic images, a subtle area of suspected architectural distortion is detected in the medial right breast near the level of the nipple, middle third of the breast parenchyma, that is not seen in the 2D images. Additional  spot compression views with tomography and a 90 degree lateral view with tomography confirm the presence of architectural distortion without a discrete central mass. The distortion is best seen in the CC projection. Mammographic images were processed with CAD. On physical exam, no mass is palpated in the medial right breast. Targeted ultrasound is performed, showing normal fibroglandular tissue in the medial right breast, with specific attention to the 3 o'clock region near the level of the nipple. Negative for right axillary lymphadenopathy. IMPRESSION: 1. Architectural distortion in the medial right breast, 3 o'clock region. Findings are most suggestive of a complex sclerosing lesion. Malignancy can not be excluded. 2. No evidence of malignancy in the left breast. 3. Normal right axillary lymph nodes. RECOMMENDATION: Stereotactic biopsy of the right breast architectural distortion is recommended and will be scheduled for the patient. I have discussed the findings and  recommendations with the patient. Results were also provided in writing at the conclusion of the visit. If applicable, a reminder letter will be sent to the patient regarding the next appointment. BI-RADS CATEGORY  4: Suspicious. Electronically Signed   By: Curlene Dolphin M.D.   On: 04/17/2018 15:04   Mm Clip Placement Right  Result Date: 04/24/2018 CLINICAL DATA:  Post stereotactic core needle biopsy of right breast slightly lower inner quadrant distortion. EXAM: DIAGNOSTIC RIGHT MAMMOGRAM POST stereotactic BIOPSY COMPARISON:  Previous exam(s). FINDINGS: Mammographic images were obtained following stereotactic guided biopsy of right breast distortion. Mammographic views of the right breast demonstrate presence of X shaped marker in the right inner breast. The marker has displaced 2.9 cm superiorly. IMPRESSION: Placement of X shaped marker post stereotactic core needle biopsy of right breast slightly lower inner quadrant distortion. The X shaped  marker is displaced 2.9 cm superiorly from the center of the distortion. Final Assessment: Post Procedure Mammograms for Marker Placement Electronically Signed   By: Fidela Salisbury M.D.   On: 04/24/2018 09:56   Mm Rt Breast Bx W Loc Dev 1st Lesion Image Bx Spec Stereo Guide  Addendum Date: 04/26/2018   ADDENDUM REPORT: 04/25/2018 15:22 ADDENDUM: Pathology of the right breast biopsy revealed - BENIGN MAMMARY TISSUE WITH STROMAL FIBROSIS AND FIBROCYSTIC CHANGES. - FOCAL SCLEROSING ADENOSIS WITH ASSOCIATED CALCIFICATIONS. - NEGATIVE FOR ATYPICAL PROLIFERATIVE BREAST DISEASE. This was found to be discordant by Dr. Jetta Lout. Recommendation: Surgical referral for excisional biospy. At the patient's request, results and recommendations were relayed to the Ms. Behringer by phone by Eino Farber on 04/25/2018 at 2:59 PM. The patient stated she did well following the biopsy with minimal pain and discomfort. Post biopsy instructions were reviewed with the patient and all of her questions were answered. The patient was encouraged to call [facility] with any further questions or concerns. Deneice Breedlove, CN-BI, RT R M, B.S. Electronically Signed   By: Fidela Salisbury M.D.   On: 04/25/2018 15:22   Result Date: 04/26/2018 CLINICAL DATA:  Right breast distortion. EXAM: RIGHT BREAST STEREOTACTIC CORE NEEDLE BIOPSY COMPARISON:  Previous exams. FINDINGS: The patient and I discussed the procedure of stereotactic-guided biopsy including benefits and alternatives. We discussed the high likelihood of a successful procedure. We discussed the risks of the procedure including infection, bleeding, tissue injury, clip migration, and inadequate sampling. Informed written consent was given. The usual time out protocol was performed immediately prior to the procedure. Using sterile technique and 1% Lidocaine as local anesthetic, under stereotactic guidance, a 9 gauge vacuum assisted device was used to perform  core needle biopsy of distortion in the slightly lower inner quadrant of the right breast using a superior approach. Specimen radiograph was performed showing presence of tissue. Lesion quadrant: Lower inner quadrant At the conclusion of the procedure, a X shaped tissue marker clip was deployed into the biopsy cavity. Follow-up 2-view mammogram was performed and dictated separately. IMPRESSION: Stereotactic-guided biopsy of right breast. No apparent complications. Electronically Signed: By: Fidela Salisbury M.D. On: 04/24/2018 09:56    ASSESSMENT: Genetic testing and counseling.  PLAN:  1. Genetic testing and counseling: Given patient's extensive family history, she qualifies for genetic testing and laboratory work has been sent.  If results are negative, no further follow-up is necessary.  If positive, patient will return to clinic to discuss the results and prophylactic measures she could take.  Recommended she continue to get her yearly mammograms as scheduled.  No further intervention is needed.  No follow-up has been scheduled at this time. 2.  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  Patient has been instructed to keep her follow-up with surgery as scheduled.  I spent a total of 45 minutes face-to-face with the patient of which greater than 50% of the visit was spent in counseling and coordination of care as detailed above.   Patient expressed understanding and was in agreement with this plan. She also understands that She can call clinic at any time with any questions, concerns, or complaints.    Lloyd Huger, MD   04/28/2018 11:19 AM

## 2018-05-24 ENCOUNTER — Other Ambulatory Visit: Payer: Self-pay | Admitting: General Surgery

## 2018-05-24 DIAGNOSIS — R928 Other abnormal and inconclusive findings on diagnostic imaging of breast: Secondary | ICD-10-CM

## 2018-05-25 ENCOUNTER — Other Ambulatory Visit: Payer: Self-pay | Admitting: General Surgery

## 2018-05-25 DIAGNOSIS — R928 Other abnormal and inconclusive findings on diagnostic imaging of breast: Secondary | ICD-10-CM

## 2018-05-28 ENCOUNTER — Telehealth: Payer: 59 | Admitting: Physician Assistant

## 2018-05-28 DIAGNOSIS — R109 Unspecified abdominal pain: Secondary | ICD-10-CM

## 2018-05-28 DIAGNOSIS — R300 Dysuria: Secondary | ICD-10-CM

## 2018-05-28 NOTE — Progress Notes (Signed)
Based on what you shared with me it looks like you have a condition that should be evaluated in a face to face office visit. Giving both back pain and belly pain, you need assessment to make sure there is no concern for a more complicated bladder infection. I would recommend [facility] or Urgent Care today for assessment.  NOTE: If you entered your credit card information for this eVisit, you will not be charged. You may see a "hold" on your card for the $30 but that hold will drop off and you will not have a charge processed.  If you are having a true medical emergency please call 911.  If you need an urgent face to face visit, [place] has four urgent care centers for your convenience.  If you need care fast and have a high deductible or no insurance consider:   [email or web address] to reserve your spot online an avoid wait times  [facility] [address] [place], [place] [address] 8 am to 8 pm Monday-Friday 10 am to 4 pm Saturday-Sunday *Across the street from [employer]  [place], [phone] 8 am to 5 pm Monday-Friday * In the [facility] on the [facility]   The following sites will take your  insurance:  . [facility] [place] a Provider at this Location  [address] [place], [place] [address] . 10 am to 8 pm Monday-Friday . 12 pm to 8 pm Saturday-Sunday   . [facility] [place] a Provider at this Location  [address] [place], [place] [address] . 8 am to 8 pm Monday-Friday . 9 am to 6 pm Saturday . 11 am to 6 pm Sunday   . [facility] at [place] Get Driving Directions  [address] [place], [place] [address] . 8 am to 8 pm Monday-Friday . 8 am to 4 pm Saturday-Sunday   Your e-visit  answers were reviewed by a board certified advanced clinical practitioner to complete your personal care plan.  Thank you for using e-Visits.

## 2018-05-29 ENCOUNTER — Ambulatory Visit (INDEPENDENT_AMBULATORY_CARE_PROVIDER_SITE_OTHER): Payer: Self-pay | Admitting: Physician Assistant

## 2018-05-29 VITALS — BP 100/70 | HR 64 | Temp 98.4°F | Resp 16 | Ht 68.0 in | Wt 146.0 lb

## 2018-05-29 DIAGNOSIS — R300 Dysuria: Secondary | ICD-10-CM

## 2018-05-29 DIAGNOSIS — F419 Anxiety disorder, unspecified: Secondary | ICD-10-CM | POA: Insufficient documentation

## 2018-05-29 DIAGNOSIS — K279 Peptic ulcer, site unspecified, unspecified as acute or chronic, without hemorrhage or perforation: Secondary | ICD-10-CM | POA: Insufficient documentation

## 2018-05-29 LAB — POCT URINALYSIS DIPSTICK
Bilirubin, UA: NEGATIVE
Blood, UA: NEGATIVE
GLUCOSE UA: NEGATIVE
Ketones, UA: NEGATIVE
LEUKOCYTES UA: NEGATIVE
Nitrite, UA: NEGATIVE
Protein, UA: NEGATIVE
Urobilinogen, UA: 0.2 E.U./dL
pH, UA: 7 (ref 5.0–8.0)

## 2018-05-29 LAB — POCT URINE PREGNANCY: Preg Test, Ur: NEGATIVE

## 2018-05-29 MED ORDER — NITROFURANTOIN MONOHYD MACRO 100 MG PO CAPS: 100.0000 mg | ORAL_CAPSULE | Freq: Two times a day (BID) | ORAL | 0 refills | Status: AC

## 2018-05-29 NOTE — Progress Notes (Signed)
Patient ID: Stephanie Chambers DOB: 05/18/1975 AGE: 43 y.o. MRN: [id number]   PCP: Stephanie Starks, PA-C   Chief Complaint:  Chief Complaint  Patient presents with  . Dysuria    x1d     Subjective:    HPI:  Stephanie Chambers is a 43 y.o. female presents for evaluation  Chief Complaint  Patient presents with  . Dysuria    x16d   43 year old female presents with two day history of dysuria, increased urinary frequency, and urinary urgency. Associated bilateral low back pain and pelvic/lower abdominal pain. Persistent; has not worsened or improved. Had similar symptoms last week, lasted for a few hours, then self-resolved. Patient has taken one dose of Pyridium (yesterday morning) and two doses of previously prescribed Macrobid (one dose yesterday evening and one dose a few hours ago) with no improvement. Reports associated headache (states per her normal for UTIs). Denies fever, chills, sweats, nausea/vomiting, diarrhea, vaginal rash/discharge/pruritis, concern for STD, concern for pregnancy, nocturia, gross hematuria, foul odor to urine, urinary incontinence.  Patient performed E-Visit yesterday, 05/28/2018, with complaint of dysuria. Patient reported associated back pain and abdominal pain; therefore, was refunded money and advised to be evaluated in-person at an urgent care clinic.  Patient reports history of frequent UTIs and kidney infections since she was a young child. Denies previous history of ureteral stone. Patient states last UTI was over 1 year ago.  Patient seen by Dr. Hassell Done Defrancesco with [facility] on 05/19/2016. Reported irritative voiding symptoms, with negative recent urine culture. Patient had previously had her bladder stem stretched by Dr. Elisabeth Pigeon, with symptomatic improvement. Patient was prescribed Mirabegron 25mg  qd and Macrobid 100mg  bid (had just completed Cipro 500mg  bid x 7 day course). Patient was advised to follow-up with urologist for  further evaluation and management. No mention of urology evaluation at 10/06/2017 appointment. Patient states she never went and saw urology; symptoms seemed to self resolve.  Urine culture performed on 05/19/16 revealed no growth. Urine culture performed on 05/13/2016 revealed no growth. Urine culture on 04/16/2016 revealed no growth. No older urine culture in Epic for review.  A limited review of symptoms was performed, pertinent positives and negatives as mentioned in HPI.  The following portions of the patient's history were reviewed and updated as appropriate: allergies, current medications and past medical history.  Patient Active Problem List   Diagnosis Date Noted  . Anxiety 05/29/2018  . Peptic ulcer disease 05/29/2018  . Multiple joint pain 04/27/2017  . Other fatigue 04/27/2017  . Right upper quadrant pain 04/27/2017  . Hair loss 04/27/2017  . Family history of colon cancer in father 04/27/2017  . Pain of right breast 02/12/2016  . Family history of breast cancer in mother 02/12/2016  . Cephalalgia 10/14/2015  . PMS (premenstrual syndrome) 10/14/2015  . History of anemia 04/19/2013    Allergies  Allergen Reactions  . Cefdinir Shortness Of Breath    Current Outpatient Medications on File Prior to Visit  Medication Sig Dispense Refill  . buPROPion (WELLBUTRIN SR) 150 MG 12 hr tablet Take 1 tablet (150 mg total) by mouth 2 (two) times daily. 120 tablet 6  . Dextromethorphan-guaiFENesin (MUCINEX DM MAXIMUM STRENGTH) 60-1200 MG TB12 Take 1 tablet by mouth daily. 20 each 0  . ibuprofen (ADVIL,MOTRIN) 800 MG tablet Take 1 tablet (800 mg total) by mouth every 8 (eight) hours as needed. 30 tablet 0  . LORazepam (ATIVAN) 1 MG tablet Take 1 tablet (1 mg total)  by mouth as needed for anxiety. 30 tablet 0  . mometasone (NASONEX) 50 MCG/ACT nasal spray Place 2 sprays into the nose daily.     No current facility-administered medications on file prior to visit.        Objective:    Vitals:   05/29/18 1322  BP: 100/70  Pulse: 64  Resp: 16  Temp: 98.4 F (36.9 C)  SpO2: 97%     Wt Readings from Last 3 Encounters:  05/29/18 146 lb (66.2 kg)  04/03/18 145 lb (65.8 kg)  03/27/18 145 lb (65.8 kg)    Physical Exam:   General Appearance:  Patient sitting comfortably on examination table. Conversational. Kermit Balo self-historian. In no acute distress. Afebrile.   Head:  Normocephalic, without obvious abnormality, atraumatic  Lungs:   Clear to auscultation bilaterally, respirations unlabored  Heart:  Regular rate and rhythm, S1 and S2 normal, no murmur, rub, or gallop  Abdomen:   Normal appearance. No gross deformity. No erythema or ecchymosis. Normoactive bowel sounds. Soft. Nontender. Patient reports discomfort in suprapubic area; however, no guarding, rigidity, or rebound tenderness. Patient localizes pain at upper portion of lumbar spine, paraspinal area, equal bilaterally. Negative CVA tenderness with percussion bilaterally.  No discomfort with moving from seated position to lying flat. No antalgia. No pain with ambulation.  Extremities: Extremities normal, atraumatic, no cyanosis or edema  Pulses: 2+ and symmetric  Skin: Skin color, texture, turgor normal, no rashes or lesions  Lymph nodes: Cervical, supraclavicular, and axillary nodes normal  Neurologic: Normal    Assessment & Plan:    Exam findings, diagnosis etiology and medication use and indications reviewed with patient. Follow-Up and discharge instructions provided. No emergent/urgent issues found on exam.  Patient education was provided.   Patient verbalized understanding of information provided and agrees with plan of care (POC), all questions answered. The patient is advised to call or return to clinic if condition does not see an improvement in symptoms, or to seek the care of the closest emergency department if condition worsens with the below plan.    Urinalysis revealed negative blood, negative  bilirubin, negative ketones, negative protein, negative nitrites, negative glucose, 7.0 pH, <1.005 specific gravity, negative leukocytes, light yellow color, and clear clarity.  Negative urine pregnancy test.   1. Dysuria - POCT Urinalysis Dipstick - POCT urine pregnancy - nitrofurantoin, macrocrystal-monohydrate, (MACROBID) 100 MG capsule; Take 1 capsule (100 mg total) by mouth 2 (two) times daily for 5 days.  Dispense: 10 capsule; Refill: 0  Patient with two day history of dysuria, increased urinary frequency, urinary urgency, and lower abdominal and low back pain. VSS, afebrile, in no acute distress, benign physical exam, no CVA tenderness with percussion. Urinalysis benign. However, reading <1.005 specific gravity and patient has taken 2 doses of Macrobid 100mg  (yesterday evening and just a few hours ago).   Patient adamant her current symptoms feel like her typical UTI. However, patient does have a history of irritative voiding symptoms with no growth on urine cultures.  Agreed to prescribed 5-day course of Macrobid 100mg  bid. Has worked well for the patient in the past and will continue treatment course patient already started. Advised patient follow-up with PCP, Ob/Gyn, urgent care, or ED if symptoms do not improve in two days - sooner with worsening symptoms such as fever, chills, flank pain, worsening abdominal pain, etc. Discussed with patient [facility] cannot send urine for culture. Patient may need repeat UA with C&S, imaging, blood work, if patient develops pyelonephritis could need  IV antibiotics, etc. Patient states she understands. Agrees with plan.  Also advised patient schedule appointment with urologist for management of recurrent UTIs and irritative voiding symptoms.   Stephanie Chambers, MHS, PA-C Stephanie Chambers, MHS, PA-C Advanced Practice Provider [facility]  [address], [facility], [place], [place] [address] (p):   [phone] [email or web address]

## 2018-05-29 NOTE — Patient Instructions (Addendum)
Thank you for choosing [facility] for your health care needs.  You have been diagnosed with dysuria, increased urinary frequency, urinary urgency, and associated lower abdominal and low back pain.  Concern for possible urinary tract infection.  Will prescribed 5-day course of Macrobid.  Recommend you increase fluids; water or cranberry juice. Empty bladder frequently. Take over the counter Tylenol or ibuprofen for pain/discomfort.  Follow-up with family physician, Ob/Gyn, urgent care, or emergency room if you develop worsening symptoms (including fever, chills, flank pain, etc) or in 2 days if your symptoms are not improving.  Urinary Tract Infection, Adult A urinary tract infection (UTI) is an infection of any part of the urinary tract. The urinary tract includes:  The kidneys.  The ureters.  The bladder.  The urethra. These organs make, store, and get rid of pee (urine) in the body. What are the causes? This is caused by germs (bacteria) in your genital area. These germs grow and cause swelling (inflammation) of your urinary tract. What increases the risk? You are more likely to develop this condition if:  You have a small, thin tube (catheter) to drain pee.  You cannot control when you pee or poop (incontinence).  You are female, and: ? You use these methods to prevent pregnancy: ? A medicine that kills sperm (spermicide). ? A device that blocks sperm (diaphragm). ? You have low levels of a female hormone (estrogen). ? You are pregnant.  You have genes that add to your risk.  You are sexually active.  You take antibiotic medicines.  You have trouble peeing because of: ? A prostate that is bigger than normal, if you are female. ? A blockage in the part of your body that drains pee from the bladder (urethra). ? A kidney stone. ? A nerve condition that affects your bladder (neurogenic bladder). ? Not getting enough to drink. ? Not peeing often enough.  You have  other conditions, such as: ? Diabetes. ? A weak disease-fighting system (immune system). ? Sickle cell disease. ? Gout. ? Injury of the spine. What are the signs or symptoms? Symptoms of this condition include:  Needing to pee right away (urgently).  Peeing often.  Peeing small amounts often.  Pain or burning when peeing.  Blood in the pee.  Pee that smells bad or not like normal.  Trouble peeing.  Pee that is cloudy.  Fluid coming from the vagina, if you are female.  Pain in the belly or lower back. Other symptoms include:  Throwing up (vomiting).  No urge to eat.  Feeling mixed up (confused).  Being tired and grouchy (irritable).  A fever.  Watery poop (diarrhea). How is this treated? This condition may be treated with:  Antibiotic medicine.  Other medicines.  Drinking enough water. Follow these instructions at home:  Medicines  Take over-the-counter and prescription medicines only as told by your doctor.  If you were prescribed an antibiotic medicine, take it as told by your doctor. Do not stop taking it even if you start to feel better. General instructions  Make sure you: ? Pee until your bladder is empty. ? Do not hold pee for a long time. ? Empty your bladder after sex. ? Wipe from front to back after pooping if you are a female. Use each tissue one time when you wipe.  Drink enough fluid to keep your pee pale yellow.  Keep all follow-up visits as told by your doctor. This is important. Contact a doctor if:  You do  not get better after 1-2 days.  Your symptoms go away and then come back. Get help right away if:  You have very bad back pain.  You have very bad pain in your lower belly.  You have a fever.  You are sick to your stomach (nauseous).  You are throwing up. Summary  A urinary tract infection (UTI) is an infection of any part of the urinary tract.  This condition is caused by germs in your genital area.  There  are many risk factors for a UTI. These include having a small, thin tube to drain pee and not being able to control when you pee or poop.  Treatment includes antibiotic medicines for germs.  Drink enough fluid to keep your pee pale yellow. This information is not intended to replace advice given to you by your health care provider. Make sure you discuss any questions you have with your health care provider. Document Released: 09/22/2007 Document Revised: 10/13/2017 Document Reviewed: 10/13/2017 Elsevier Interactive Patient Education  2019 [employer].

## 2018-05-30 ENCOUNTER — Ambulatory Visit (INDEPENDENT_AMBULATORY_CARE_PROVIDER_SITE_OTHER): Payer: 59 | Admitting: Urology

## 2018-05-30 ENCOUNTER — Encounter: Payer: Self-pay | Admitting: Urology

## 2018-05-30 VITALS — BP 108/71 | HR 64 | Ht 66.0 in | Wt 146.0 lb

## 2018-05-30 DIAGNOSIS — R300 Dysuria: Secondary | ICD-10-CM | POA: Diagnosis not present

## 2018-05-30 LAB — URINALYSIS, COMPLETE
Bilirubin, UA: NEGATIVE
Glucose, UA: NEGATIVE
Ketones, UA: NEGATIVE
Leukocytes, UA: NEGATIVE
Nitrite, UA: NEGATIVE
Protein, UA: NEGATIVE
RBC, UA: NEGATIVE
Specific Gravity, UA: 1.015 (ref 1.005–1.030)
Urobilinogen, Ur: 0.2 mg/dL (ref 0.2–1.0)
pH, UA: 7 (ref 5.0–7.5)

## 2018-05-30 LAB — MICROSCOPIC EXAMINATION
RBC, UA: NONE SEEN /hpf (ref 0–2)
WBC, UA: NONE SEEN /hpf (ref 0–5)

## 2018-05-30 MED ORDER — SULFAMETHOXAZOLE-TRIMETHOPRIM 800-160 MG PO TABS: 1.0000 | ORAL_TABLET | Freq: Two times a day (BID) | ORAL | 0 refills | Status: DC

## 2018-05-30 NOTE — Progress Notes (Signed)
05/30/2018 11:01 AM   Stephanie Chambers 08-12-1975 [id number]  Referring provider: Versie Starks, PA-C [place], [place] [id number]  Chief Complaint  Patient presents with  . Painful Urination    back pain    HPI: Patient is a 43 -year-old Caucasian female who is referred to [place] by, Ashok Cordia, PA-C, for recurrent urinary tract infections.  She states she has a remote history of rUTI's, but she has not had any problems for over one year until now.  Reviewing her records,  she has had no documented UTI's.    Her symptoms with a urinary tract infection consist of urgency, dysuria and suprapubic pressure.    She states on 05/20/2018, she had intercourse two times that evening.  The next day, she woke up to feelings of pelvic pain and pelvic pressure.  She took an ibuprofen and the symptoms abated only to return a week later.  In addition to the pelvic pain and pressure, she was experiencing bloating, LBP, urgency and dysuria.  Patient denies any gross hematuria, dysuria or suprapubic/flank pain.  Patient denies any fevers, chills, nausea or vomiting.  She then took Pyridium and Macrobid that she had on hand.    She was seen at the employee clinic yesterday and urine dip was negative and pregnancy test was negative.   She does not have a history of nephrolithiasis.  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     She does not engage in anal sex.  She is not postmenopausal.  She denies constipation and/or diarrhea.  She not had any recent imaging studies.    She is drinking a lot of water daily.   She drinks alcohol, sodas and teas occasionally when she goes out to eat.      PMH: Past Medical History:  Diagnosis Date  . Anemia   . Anxiety   . Bulging of cervical intervertebral disc   . Constipation   . Depression    major  . DUB (dysfunctional uterine bleeding)   . Endometrial polyp  05/2012   hysteroscopy w/ d &C  . Epigastric abdominal pain   . Lump in female breast   . Mastodynia   . Menorrhagia   . UTI (lower urinary tract infection)     Surgical History: Past Surgical History:  Procedure Laterality Date  . BREAST BIOPSY Right 04/24/2018   Affirm Bx- X-clip, path pending  . DIAGNOSTIC LAPAROSCOPY  2004  . DILATION AND CURETTAGE OF UTERUS    . HYSTEROSCOPY    . TONSILLECTOMY  1998    Home Medications:  Allergies as of 05/30/2018      Reactions   Cefdinir Shortness Of Breath      Medication List       Accurate as of May 30, 2018 11:01 AM. Always use your most recent med list.        buPROPion 150 MG 12 hr tablet Commonly known as:  WELLBUTRIN SR Take 1 tablet (150 mg total) by mouth 2 (two) times daily.   ibuprofen 800 MG tablet Commonly known as:  ADVIL,MOTRIN Take 1 tablet (800 mg total) by mouth every 8 (eight) hours as needed.   LORazepam 1 MG tablet Commonly known as:  ATIVAN Take 1 tablet (1 mg total) by mouth as needed for anxiety.   mometasone 50 MCG/ACT nasal spray Commonly known as:  NASONEX Place 2 sprays into  the nose daily.   MUCINEX DM MAXIMUM STRENGTH 60-1200 MG Tb12 Take 1 tablet by mouth daily.   nitrofurantoin (macrocrystal-monohydrate) 100 MG capsule Commonly known as:  MACROBID Take 1 capsule (100 mg total) by mouth 2 (two) times daily for 5 days.   sulfamethoxazole-trimethoprim 800-160 MG tablet Commonly known as:  BACTRIM DS,SEPTRA DS Take 1 tablet by mouth every 12 (twelve) hours.       Allergies:  Allergies  Allergen Reactions  . Cefdinir Shortness Of Breath    Family History: Family History  Problem Relation Age of Onset  . Cancer Mother 38       breast with liver mets  . Breast cancer Mother   . Cancer Father 31       colon, lung, kidney  . Heart disease Father   . Colon cancer Father   . Depression Maternal Grandmother   . Cancer Maternal Grandmother        colon  . Colon cancer  Maternal Grandmother   . Depression Paternal Grandmother   . Heart disease Paternal Grandfather   . Breast cancer Cousin   . Ovarian cancer Neg Hx     Social History:  reports that she has quit smoking. She has never used smokeless tobacco. She reports current alcohol use. She reports that she does not use drugs.  ROS: UROLOGY Frequent Urination?: Yes Hard to postpone urination?: No Burning/pain with urination?: Yes Get up at night to urinate?: No Leakage of urine?: No Urine stream starts and stops?: No Trouble starting stream?: No Do you have to strain to urinate?: No Blood in urine?: No Urinary tract infection?: Yes Sexually transmitted disease?: No Injury to kidneys or bladder?: No Painful intercourse?: No Weak stream?: No Currently pregnant?: No Vaginal bleeding?: No Last menstrual period?: n  Gastrointestinal Nausea?: No Vomiting?: No Indigestion/heartburn?: No Diarrhea?: No Constipation?: No  Constitutional Fever: No Night sweats?: No Weight loss?: No Fatigue?: No  Skin Skin rash/lesions?: No Itching?: No  Eyes Blurred vision?: No Double vision?: No  Ears/Nose/Throat Sore throat?: No Sinus problems?: No  Hematologic/Lymphatic Swollen glands?: No Easy bruising?: No  Cardiovascular Leg swelling?: No Chest pain?: No  Respiratory Cough?: No Shortness of breath?: No  Endocrine Excessive thirst?: No  Musculoskeletal Back pain?: No Joint pain?: No  Neurological Headaches?: Yes Dizziness?: No  Psychologic Depression?: No Anxiety?: No  Physical Exam: BP 108/71 (BP Location: Left Arm, Patient Position: Sitting)   Pulse 64   Ht 5\' 6"  (1.676 m)   Wt 146 lb (66.2 kg)   LMP 05/06/2018   BMI 23.57 kg/m   Constitutional:  Well nourished. Alert and oriented, No acute distress. HEENT: [place] AT, moist mucus membranes.  Trachea midline, no masses. Cardiovascular: No clubbing, cyanosis, or edema. Respiratory: Normal respiratory effort, no  increased work of breathing. GI: Abdomen is soft, non tender, non distended, no abdominal masses. Liver and spleen not palpable.  No hernias appreciated.  Stool sample for occult testing is not indicated.   GU: No CVA tenderness.  No bladder fullness or masses.  Normal external genitalia, normal pubic hair distribution, no lesions.  Normal urethral meatus, no lesions, no prolapse, no discharge.   No urethral masses, tenderness and/or tenderness. No bladder fullness, tenderness or masses. Normal vagina mucosa, good estrogen effect, no discharge, no lesions, good pelvic support, no cystocele and no rectocele noted.  No cervical motion tenderness.  Uterus is freely mobile and non-fixed.  No adnexal/parametria masses or tenderness noted.  Anus and perineum are without  rashes or lesions.    Skin: No rashes, bruises or suspicious lesions. Lymph: No cervical or inguinal adenopathy. Neurologic: Grossly intact, no focal deficits, moving all 4 extremities. Psychiatric: Normal mood and affect.   Laboratory Data: Lab Results  Component Value Date   WBC 4.2 11/16/2017   HGB 11.6 11/16/2017   HCT 35.3 11/16/2017   MCV 92 11/16/2017   PLT 234 11/16/2017    Lab Results  Component Value Date   CREATININE 0.95 11/16/2017    No results found for: PSA  No results found for: TESTOSTERONE  No results found for: HGBA1C  Lab Results  Component Value Date   TSH 1.250 11/16/2017       Component Value Date/Time   CHOL 213 (H) 11/16/2017 1509   HDL 93 11/16/2017 1509   CHOLHDL 2.3 11/16/2017 1509   LDLCALC 111 (H) 11/16/2017 1509    Lab Results  Component Value Date   AST 14 11/16/2017   Lab Results  Component Value Date   ALT 10 11/16/2017   No components found for: ALKALINEPHOPHATASE No components found for: BILIRUBINTOTAL  No results found for: ESTRADIOL  Urinalysis    Component Value Date/Time   APPEARANCEUR Clear 05/19/2016 1546   GLUCOSEU Negative 05/19/2016 1546   BILIRUBINUR  neg 05/29/2018 1336   BILIRUBINUR Negative 05/19/2016 1546   PROTEINUR Negative 05/29/2018 1336   PROTEINUR Negative 05/19/2016 1546   UROBILINOGEN 0.2 05/29/2018 1336   NITRITE neg 05/29/2018 1336   NITRITE Positive (A) 05/19/2016 1546   LEUKOCYTESUR Negative 05/29/2018 1336   LEUKOCYTESUR 1+ (A) 05/19/2016 1546   Today's UA: Bland.  See Epic.   I have reviewed the labs.   Assessment & Plan:    1. Dysuria -UA bland, but may be due to having some antibiotics on board skewing results - will send for culture - will start Septra DS at this time -asked the patient to contact us by Friday if no improvement in symptoms as we may need to do more studies - Urinalysis, Complete - CULTURE, URINE COMPREHENSIVE   Return for pending urine culture .  These notes generated with voice recognition software. I apologize for typographical errors.  Zara Council, PA-C  [facility] [address]  [place], [place] [address] [phone]

## 2018-06-01 ENCOUNTER — Telehealth: Payer: Self-pay | Admitting: Emergency Medicine

## 2018-06-01 NOTE — Telephone Encounter (Signed)
Left message following up on visit with [facility] 

## 2018-06-02 LAB — CULTURE, URINE COMPREHENSIVE

## 2018-06-05 ENCOUNTER — Telehealth: Payer: Self-pay

## 2018-06-05 NOTE — Telephone Encounter (Signed)
-----   Message from Nori Riis, PA-C sent at 06/05/2018  8:20 AM EST ----- Please let Siomara know that her urine culture grew out a small amount of bacteria.  We typically do not consider this small amount of growth an UTI, but she had antibiotics on board when the culture was done as she was still symptomatic.  She was prescribed Septra, but the organism was not tested against this antibiotic.  If she is still having symptoms, we need to have her start Cipro 500 mg bid x 5 days.

## 2018-06-29 MED ORDER — CIPROFLOXACIN HCL 500 MG PO TABS: 500.0000 mg | ORAL_TABLET | Freq: Two times a day (BID) | ORAL | 0 refills | Status: DC

## 2018-06-29 NOTE — Telephone Encounter (Signed)
Ilene called back to let you know that she is still having symptoms when you talked to her the last time and thought that you were going to be calling in some cipro to the employee pharmacy? I didn't see it can you verify? If so can you call this in for her please?   Thanks, Sharyn Lull

## 2018-08-24 ENCOUNTER — Other Ambulatory Visit: Payer: Self-pay | Admitting: General Surgery

## 2018-09-14 ENCOUNTER — Ambulatory Visit (HOSPITAL_BASED_OUTPATIENT_CLINIC_OR_DEPARTMENT_OTHER): Admit: 2018-09-14 | Payer: 59 | Admitting: General Surgery

## 2018-09-14 ENCOUNTER — Encounter (HOSPITAL_BASED_OUTPATIENT_CLINIC_OR_DEPARTMENT_OTHER): Payer: Self-pay

## 2018-09-14 SURGERY — RADIOACTIVE SEED GUIDED BREAST BIOPSY
Anesthesia: General | Site: Breast | Laterality: Right

## 2018-09-27 ENCOUNTER — Other Ambulatory Visit: Payer: Self-pay

## 2018-09-27 ENCOUNTER — Encounter (HOSPITAL_BASED_OUTPATIENT_CLINIC_OR_DEPARTMENT_OTHER): Payer: Self-pay | Admitting: *Deleted

## 2018-10-05 ENCOUNTER — Other Ambulatory Visit: Payer: Self-pay

## 2018-10-05 ENCOUNTER — Encounter (HOSPITAL_BASED_OUTPATIENT_CLINIC_OR_DEPARTMENT_OTHER): Payer: Self-pay | Admitting: *Deleted

## 2018-10-06 ENCOUNTER — Other Ambulatory Visit: Payer: Self-pay | Admitting: General Surgery

## 2018-10-06 DIAGNOSIS — N6310 Unspecified lump in the right breast, unspecified quadrant: Secondary | ICD-10-CM

## 2018-10-09 ENCOUNTER — Other Ambulatory Visit (HOSPITAL_COMMUNITY): Payer: 59

## 2018-10-09 ENCOUNTER — Other Ambulatory Visit
Admission: RE | Admit: 2018-10-09 | Discharge: 2018-10-09 | Disposition: A | Discharge status: Home / Self Care | Payer: 59 | Source: Ambulatory Visit | Attending: General Surgery | Admitting: General Surgery

## 2018-10-09 ENCOUNTER — Other Ambulatory Visit: Payer: Self-pay

## 2018-10-09 DIAGNOSIS — Z1159 Encounter for screening for other viral diseases: Secondary | ICD-10-CM | POA: Diagnosis not present

## 2018-10-10 LAB — NOVEL CORONAVIRUS, NAA (HOSP ORDER, SEND-OUT TO REF LAB; TAT 18-24 HRS): SARS-CoV-2, NAA: NOT DETECTED

## 2018-10-11 ENCOUNTER — Ambulatory Visit
Admission: RE | Admit: 2018-10-11 | Discharge: 2018-10-11 | Disposition: A | Discharge status: Home / Self Care | Payer: 59 | Source: Ambulatory Visit | Attending: General Surgery | Admitting: General Surgery

## 2018-10-11 DIAGNOSIS — R928 Other abnormal and inconclusive findings on diagnostic imaging of breast: Secondary | ICD-10-CM

## 2018-10-11 HISTORY — PX: BREAST EXCISIONAL BIOPSY: SUR124

## 2018-10-12 ENCOUNTER — Ambulatory Visit
Admission: RE | Admit: 2018-10-12 | Discharge: 2018-10-12 | Disposition: A | Discharge status: Home / Self Care | Payer: 59 | Source: Ambulatory Visit | Attending: General Surgery | Admitting: General Surgery

## 2018-10-12 ENCOUNTER — Encounter (HOSPITAL_BASED_OUTPATIENT_CLINIC_OR_DEPARTMENT_OTHER): Payer: Self-pay | Admitting: Anesthesiology

## 2018-10-12 ENCOUNTER — Other Ambulatory Visit: Payer: Self-pay

## 2018-10-12 ENCOUNTER — Ambulatory Visit (HOSPITAL_BASED_OUTPATIENT_CLINIC_OR_DEPARTMENT_OTHER)
Admission: RE | Admit: 2018-10-12 | Discharge: 2018-10-12 | Disposition: A | Discharge status: Home / Self Care | Payer: 59 | Attending: General Surgery | Admitting: General Surgery

## 2018-10-12 ENCOUNTER — Encounter (HOSPITAL_BASED_OUTPATIENT_CLINIC_OR_DEPARTMENT_OTHER)
Admission: RE | Disposition: A | Discharge status: Home / Self Care | Payer: Self-pay | Source: Home / Self Care | Attending: General Surgery

## 2018-10-12 ENCOUNTER — Ambulatory Visit (HOSPITAL_BASED_OUTPATIENT_CLINIC_OR_DEPARTMENT_OTHER): Payer: 59 | Admitting: Anesthesiology

## 2018-10-12 DIAGNOSIS — N6489 Other specified disorders of breast: Secondary | ICD-10-CM | POA: Diagnosis not present

## 2018-10-12 DIAGNOSIS — D235 Other benign neoplasm of skin of trunk: Secondary | ICD-10-CM | POA: Diagnosis not present

## 2018-10-12 DIAGNOSIS — D649 Anemia, unspecified: Secondary | ICD-10-CM | POA: Diagnosis not present

## 2018-10-12 DIAGNOSIS — K279 Peptic ulcer, site unspecified, unspecified as acute or chronic, without hemorrhage or perforation: Secondary | ICD-10-CM | POA: Diagnosis not present

## 2018-10-12 DIAGNOSIS — Z803 Family history of malignant neoplasm of breast: Secondary | ICD-10-CM | POA: Insufficient documentation

## 2018-10-12 DIAGNOSIS — R928 Other abnormal and inconclusive findings on diagnostic imaging of breast: Secondary | ICD-10-CM | POA: Diagnosis not present

## 2018-10-12 DIAGNOSIS — D225 Melanocytic nevi of trunk: Secondary | ICD-10-CM | POA: Insufficient documentation

## 2018-10-12 DIAGNOSIS — Z87891 Personal history of nicotine dependence: Secondary | ICD-10-CM | POA: Diagnosis not present

## 2018-10-12 DIAGNOSIS — L821 Other seborrheic keratosis: Secondary | ICD-10-CM | POA: Diagnosis not present

## 2018-10-12 DIAGNOSIS — Z79899 Other long term (current) drug therapy: Secondary | ICD-10-CM | POA: Insufficient documentation

## 2018-10-12 DIAGNOSIS — Z885 Allergy status to narcotic agent status: Secondary | ICD-10-CM | POA: Insufficient documentation

## 2018-10-12 DIAGNOSIS — D224 Melanocytic nevi of scalp and neck: Secondary | ICD-10-CM | POA: Diagnosis not present

## 2018-10-12 DIAGNOSIS — N6011 Diffuse cystic mastopathy of right breast: Secondary | ICD-10-CM | POA: Diagnosis not present

## 2018-10-12 DIAGNOSIS — N6310 Unspecified lump in the right breast, unspecified quadrant: Secondary | ICD-10-CM | POA: Insufficient documentation

## 2018-10-12 DIAGNOSIS — F419 Anxiety disorder, unspecified: Secondary | ICD-10-CM | POA: Diagnosis not present

## 2018-10-12 HISTORY — DX: Nausea with vomiting, unspecified: R11.2

## 2018-10-12 HISTORY — PX: NEVUS EXCISION: SHX5263

## 2018-10-12 HISTORY — PX: RADIOACTIVE SEED GUIDED EXCISIONAL BREAST BIOPSY: SHX6490

## 2018-10-12 HISTORY — DX: Other specified postprocedural states: Z98.890

## 2018-10-12 SURGERY — RADIOACTIVE SEED GUIDED BREAST BIOPSY
Anesthesia: General | Site: Chest | Laterality: Right

## 2018-10-12 MED ORDER — LIDOCAINE 2% (20 MG/ML) 5 ML SYRINGE
INTRAMUSCULAR | Status: AC
  Filled 2018-10-12: qty 5

## 2018-10-12 MED ORDER — FENTANYL CITRATE (PF) 100 MCG/2ML IJ SOLN
INTRAMUSCULAR | Status: AC
  Filled 2018-10-12: qty 2

## 2018-10-12 MED ORDER — SCOPOLAMINE 1 MG/3DAYS TD PT72
MEDICATED_PATCH | TRANSDERMAL | Status: AC
  Filled 2018-10-12: qty 1

## 2018-10-12 MED ORDER — FENTANYL CITRATE (PF) 100 MCG/2ML IJ SOLN
INTRAMUSCULAR | Status: DC | PRN
  Administered 2018-10-12 (×2): 25 ug via INTRAVENOUS
  Administered 2018-10-12: 50 ug via INTRAVENOUS

## 2018-10-12 MED ORDER — CIPROFLOXACIN IN D5W 400 MG/200ML IV SOLN
INTRAVENOUS | Status: AC
  Filled 2018-10-12: qty 200

## 2018-10-12 MED ORDER — CIPROFLOXACIN IN D5W 400 MG/200ML IV SOLN
400.0000 mg | INTRAVENOUS | Status: AC
  Administered 2018-10-12: 400 mg via INTRAVENOUS

## 2018-10-12 MED ORDER — MIDAZOLAM HCL 2 MG/2ML IJ SOLN: 1.0000 mg | INTRAMUSCULAR | Status: DC | PRN

## 2018-10-12 MED ORDER — BUPIVACAINE HCL (PF) 0.25 % IJ SOLN
INTRAMUSCULAR | Status: DC | PRN
  Administered 2018-10-12: 9 mL

## 2018-10-12 MED ORDER — METOCLOPRAMIDE HCL 5 MG/ML IJ SOLN: 10.0000 mg | Freq: Once | INTRAMUSCULAR | Status: DC | PRN

## 2018-10-12 MED ORDER — MIDAZOLAM HCL 2 MG/2ML IJ SOLN
INTRAMUSCULAR | Status: DC | PRN
  Administered 2018-10-12: 2 mg via INTRAVENOUS

## 2018-10-12 MED ORDER — DIPHENHYDRAMINE HCL 50 MG/ML IJ SOLN
INTRAMUSCULAR | Status: AC
  Filled 2018-10-12: qty 1

## 2018-10-12 MED ORDER — OXYCODONE HCL 5 MG/5ML PO SOLN: 5.0000 mg | Freq: Once | ORAL | Status: AC | PRN

## 2018-10-12 MED ORDER — ACETAMINOPHEN 500 MG PO TABS
ORAL_TABLET | ORAL | Status: AC
  Filled 2018-10-12: qty 2

## 2018-10-12 MED ORDER — PROPOFOL 10 MG/ML IV BOLUS
INTRAVENOUS | Status: DC | PRN
  Administered 2018-10-12: 160 mg via INTRAVENOUS

## 2018-10-12 MED ORDER — ONDANSETRON HCL 4 MG/2ML IJ SOLN
INTRAMUSCULAR | Status: AC
  Filled 2018-10-12: qty 2

## 2018-10-12 MED ORDER — DEXAMETHASONE SODIUM PHOSPHATE 10 MG/ML IJ SOLN
INTRAMUSCULAR | Status: AC
  Filled 2018-10-12: qty 1

## 2018-10-12 MED ORDER — FENTANYL CITRATE (PF) 100 MCG/2ML IJ SOLN
25.0000 ug | INTRAMUSCULAR | Status: DC | PRN
  Administered 2018-10-12: 25 ug via INTRAVENOUS

## 2018-10-12 MED ORDER — DEXAMETHASONE SODIUM PHOSPHATE 10 MG/ML IJ SOLN
INTRAMUSCULAR | Status: DC | PRN
  Administered 2018-10-12: 8 mg via INTRAVENOUS

## 2018-10-12 MED ORDER — EPHEDRINE SULFATE-NACL 50-0.9 MG/10ML-% IV SOSY
PREFILLED_SYRINGE | INTRAVENOUS | Status: DC | PRN
  Administered 2018-10-12 (×6): 5 mg via INTRAVENOUS

## 2018-10-12 MED ORDER — LACTATED RINGERS IV SOLN
INTRAVENOUS | Status: DC
  Administered 2018-10-12 (×2): via INTRAVENOUS

## 2018-10-12 MED ORDER — GABAPENTIN 100 MG PO CAPS
100.0000 mg | ORAL_CAPSULE | ORAL | Status: AC
  Administered 2018-10-12: 100 mg via ORAL

## 2018-10-12 MED ORDER — DIPHENHYDRAMINE HCL 50 MG/ML IJ SOLN
12.5000 mg | Freq: Once | INTRAMUSCULAR | Status: AC
  Administered 2018-10-12: 12.5 mg via INTRAVENOUS

## 2018-10-12 MED ORDER — MEPERIDINE HCL 25 MG/ML IJ SOLN: 6.2500 mg | INTRAMUSCULAR | Status: DC | PRN

## 2018-10-12 MED ORDER — OXYCODONE HCL 5 MG PO TABS
5.0000 mg | ORAL_TABLET | Freq: Once | ORAL | Status: AC | PRN
  Administered 2018-10-12: 5 mg via ORAL

## 2018-10-12 MED ORDER — SCOPOLAMINE 1 MG/3DAYS TD PT72
1.0000 | MEDICATED_PATCH | Freq: Once | TRANSDERMAL | Status: DC | PRN
  Administered 2018-10-12: 1.5 mg via TRANSDERMAL

## 2018-10-12 MED ORDER — FENTANYL CITRATE (PF) 100 MCG/2ML IJ SOLN
50.0000 ug | INTRAMUSCULAR | Status: DC | PRN
  Administered 2018-10-12: 50 ug via INTRAVENOUS

## 2018-10-12 MED ORDER — OXYCODONE HCL 5 MG PO TABS
ORAL_TABLET | ORAL | Status: AC
  Filled 2018-10-12: qty 1

## 2018-10-12 MED ORDER — ACETAMINOPHEN 500 MG PO TABS
1000.0000 mg | ORAL_TABLET | ORAL | Status: AC
  Administered 2018-10-12: 1000 mg via ORAL

## 2018-10-12 MED ORDER — MIDAZOLAM HCL 2 MG/2ML IJ SOLN
INTRAMUSCULAR | Status: AC
  Filled 2018-10-12: qty 2

## 2018-10-12 MED ORDER — LIDOCAINE HCL (CARDIAC) PF 100 MG/5ML IV SOSY
PREFILLED_SYRINGE | INTRAVENOUS | Status: DC | PRN
  Administered 2018-10-12: 60 mg via INTRAVENOUS

## 2018-10-12 MED ORDER — KETOROLAC TROMETHAMINE 15 MG/ML IJ SOLN
INTRAMUSCULAR | Status: AC
  Filled 2018-10-12: qty 1

## 2018-10-12 MED ORDER — SCOPOLAMINE 1 MG/3DAYS TD PT72: 1.0000 | MEDICATED_PATCH | TRANSDERMAL | Status: DC

## 2018-10-12 MED ORDER — ONDANSETRON HCL 4 MG/2ML IJ SOLN
INTRAMUSCULAR | Status: DC | PRN
  Administered 2018-10-12: 4 mg via INTRAVENOUS

## 2018-10-12 MED ORDER — GABAPENTIN 100 MG PO CAPS
ORAL_CAPSULE | ORAL | Status: AC
  Filled 2018-10-12: qty 1

## 2018-10-12 MED ORDER — KETOROLAC TROMETHAMINE 15 MG/ML IJ SOLN
15.0000 mg | INTRAMUSCULAR | Status: AC
  Administered 2018-10-12: 15 mg via INTRAVENOUS

## 2018-10-12 SURGICAL SUPPLY — 57 items
APPLIER CLIP 9.375 MED OPEN (MISCELLANEOUS)
BINDER BREAST LRG (GAUZE/BANDAGES/DRESSINGS) ×2 IMPLANT
BINDER BREAST MEDIUM (GAUZE/BANDAGES/DRESSINGS) IMPLANT
BINDER BREAST XLRG (GAUZE/BANDAGES/DRESSINGS) IMPLANT
BINDER BREAST XXLRG (GAUZE/BANDAGES/DRESSINGS) IMPLANT
BLADE SURG 15 STRL LF DISP TIS (BLADE) ×2 IMPLANT
BLADE SURG 15 STRL SS (BLADE) ×2
CANISTER SUC SOCK COL 7IN (MISCELLANEOUS) ×2 IMPLANT
CANISTER SUCT 1200ML W/VALVE (MISCELLANEOUS) ×2 IMPLANT
CHLORAPREP W/TINT 26 (MISCELLANEOUS) ×4 IMPLANT
CLIP APPLIE 9.375 MED OPEN (MISCELLANEOUS) IMPLANT
CLIP VESOCCLUDE SM WIDE 6/CT (CLIP) IMPLANT
CLOSURE WOUND 1/2 X4 (GAUZE/BANDAGES/DRESSINGS) ×1
COVER BACK TABLE REUSABLE LG (DRAPES) ×4 IMPLANT
COVER MAYO STAND REUSABLE (DRAPES) ×4 IMPLANT
COVER PROBE W GEL 5X96 (DRAPES) ×4 IMPLANT
COVER WAND RF STERILE (DRAPES) IMPLANT
DECANTER SPIKE VIAL GLASS SM (MISCELLANEOUS) IMPLANT
DERMABOND ADVANCED (GAUZE/BANDAGES/DRESSINGS) ×2
DERMABOND ADVANCED .7 DNX12 (GAUZE/BANDAGES/DRESSINGS) ×2 IMPLANT
DRAPE LAPAROSCOPIC ABDOMINAL (DRAPES) ×4 IMPLANT
DRAPE UTILITY XL STRL (DRAPES) ×4 IMPLANT
DRSG TEGADERM 4X4.75 (GAUZE/BANDAGES/DRESSINGS) IMPLANT
ELECT COATED BLADE 2.86 ST (ELECTRODE) ×4 IMPLANT
ELECT REM PT RETURN 9FT ADLT (ELECTROSURGICAL) ×4
ELECTRODE REM PT RTRN 9FT ADLT (ELECTROSURGICAL) ×2 IMPLANT
GAUZE SPONGE 4X4 12PLY STRL LF (GAUZE/BANDAGES/DRESSINGS) IMPLANT
GLOVE BIO SURGEON STRL SZ7 (GLOVE) ×8 IMPLANT
GLOVE BIOGEL PI IND STRL 7.5 (GLOVE) ×2 IMPLANT
GLOVE BIOGEL PI INDICATOR 7.5 (GLOVE) ×2
GOWN STRL REUS W/ TWL LRG LVL3 (GOWN DISPOSABLE) ×4 IMPLANT
GOWN STRL REUS W/TWL LRG LVL3 (GOWN DISPOSABLE) ×4
HEMOSTAT ARISTA ABSORB 3G PWDR (HEMOSTASIS) IMPLANT
ILLUMINATOR WAVEGUIDE N/F (MISCELLANEOUS) IMPLANT
KIT MARKER MARGIN INK (KITS) ×4 IMPLANT
LIGHT WAVEGUIDE WIDE FLAT (MISCELLANEOUS) IMPLANT
NDL HYPO 25X1 1.5 SAFETY (NEEDLE) ×2 IMPLANT
NEEDLE HYPO 25X1 1.5 SAFETY (NEEDLE) ×4 IMPLANT
NS IRRIG 1000ML POUR BTL (IV SOLUTION) IMPLANT
PACK BASIN DAY SURGERY FS (CUSTOM PROCEDURE TRAY) ×4 IMPLANT
PENCIL BUTTON HOLSTER BLD 10FT (ELECTRODE) ×4 IMPLANT
SLEEVE SCD COMPRESS KNEE MED (MISCELLANEOUS) ×4 IMPLANT
SPONGE LAP 4X18 RFD (DISPOSABLE) ×4 IMPLANT
STRIP CLOSURE SKIN 1/2X4 (GAUZE/BANDAGES/DRESSINGS) ×3 IMPLANT
SUT MNCRL AB 4-0 PS2 18 (SUTURE) ×2 IMPLANT
SUT MON AB 5-0 PS2 18 (SUTURE) ×2 IMPLANT
SUT SILK 2 0 SH (SUTURE) IMPLANT
SUT VIC AB 2-0 SH 27 (SUTURE) ×2
SUT VIC AB 2-0 SH 27XBRD (SUTURE) ×2 IMPLANT
SUT VIC AB 3-0 SH 27 (SUTURE) ×4
SUT VIC AB 3-0 SH 27X BRD (SUTURE) ×2 IMPLANT
SYR CONTROL 10ML LL (SYRINGE) ×4 IMPLANT
TOWEL GREEN STERILE FF (TOWEL DISPOSABLE) ×4 IMPLANT
TRAY FAXITRON CT DISP (TRAY / TRAY PROCEDURE) ×4 IMPLANT
TUBE CONNECTING 20'X1/4 (TUBING) ×1
TUBE CONNECTING 20X1/4 (TUBING) ×1 IMPLANT
YANKAUER SUCT BULB TIP NO VENT (SUCTIONS) IMPLANT

## 2018-10-12 NOTE — Anesthesia Preprocedure Evaluation (Signed)
Anesthesia Evaluation  Patient identified by MRN, date of birth, ID band Patient awake    Reviewed: Allergy & Precautions, NPO status , Patient's Chart, lab work & pertinent test results  History of Anesthesia Complications (+) PONV and history of anesthetic complications  Airway Mallampati: II  TM Distance: >3 FB Neck ROM: Full    Dental no notable dental hx. (+) Teeth Intact   Pulmonary former smoker,    Pulmonary exam normal breath sounds clear to auscultation       Cardiovascular negative cardio ROS Normal cardiovascular exam Rhythm:Regular Rate:Normal     Neuro/Psych  Headaches, PSYCHIATRIC DISORDERS Anxiety Depression    GI/Hepatic Neg liver ROS, PUD,   Endo/Other  Right Breast mass  Renal/GU negative Renal ROS  negative genitourinary   Musculoskeletal negative musculoskeletal ROS (+)   Abdominal   Peds  Hematology  (+) anemia ,   Anesthesia Other Findings   Reproductive/Obstetrics                             Anesthesia Physical Anesthesia Plan  ASA: II  Anesthesia Plan: General   Post-op Pain Management:    Induction: Intravenous  PONV Risk Score and Plan: 4 or greater and Scopolamine patch - Pre-op, Midazolam, Ondansetron, Dexamethasone and Treatment may vary due to age or medical condition  Airway Management Planned: LMA  Additional Equipment:   Intra-op Plan:   Post-operative Plan: Extubation in OR  Informed Consent: I have reviewed the patients History and Physical, chart, labs and discussed the procedure including the risks, benefits and alternatives for the proposed anesthesia with the patient or authorized representative who has indicated his/her understanding and acceptance.     Dental advisory given  Plan Discussed with: CRNA and Surgeon  Anesthesia Plan Comments:         Anesthesia Quick Evaluation

## 2018-10-12 NOTE — Discharge Instructions (Signed)
[place] Office Phone Number [phone] POST OP INSTRUCTIONS Take 400 mg of ibuprofen every 8 hours or 650 mg tylenol every 6 hours for next 72 hours then as needed. Use ice several times daily also. Always review your discharge instruction sheet given to you by the facility where your surgery was performed.  IF YOU HAVE DISABILITY OR FAMILY LEAVE FORMS, YOU MUST BRING THEM TO THE OFFICE FOR PROCESSING.  DO NOT GIVE THEM TO YOUR DOCTOR.  1. A prescription for pain medication may be given to you upon discharge.  Take your pain medication as prescribed, if needed.  If narcotic pain medicine is not needed, then you may take acetaminophen (Tylenol), naprosyn (Alleve) or ibuprofen (Advil) as needed. 2. Take your usually prescribed medications unless otherwise directed 3. If you need a refill on your pain medication, please contact your pharmacy.  They will contact our office to request authorization.  Prescriptions will not be filled after 5pm or on week-ends. 4. You should eat very light the first 24 hours after surgery, such as soup, crackers, pudding, etc.  Resume your normal diet the day after surgery. 5. Most patients will experience some swelling and bruising in the breast.  Ice packs and a good support bra will help.  Wear the breast binder provided or a sports bra for 72 hours day and night.  After that wear a sports bra during the day until you return to the office. Swelling and bruising can take several days to resolve.  6. It is common to experience some constipation if taking pain medication after surgery.  Increasing fluid intake and taking a stool softener will usually help or prevent this problem from occurring.  A mild laxative (Milk of Magnesia or Miralax) should be taken according to package directions if there are no bowel movements after 48 hours. 7. Unless discharge instructions indicate otherwise, you may remove your bandages 48 hours after surgery and you may  shower at that time.  You may have steri-strips (small skin tapes) in place directly over the incision.  These strips should be left on the skin for 7-10 days and will come off on their own.  If your surgeon used skin glue on the incision, you may shower in 24 hours.  The glue will flake off over the next 2-3 weeks.  Any sutures or staples will be removed at the office during your follow-up visit. 8. ACTIVITIES:  You may resume regular daily activities (gradually increasing) beginning the next day.  Wearing a good support bra or sports bra minimizes pain and swelling.  You may have sexual intercourse when it is comfortable. a. You may drive when you no longer are taking prescription pain medication, you can comfortably wear a seatbelt, and you can safely maneuver your car and apply brakes. b. RETURN TO WORK:  ______________________________________________________________________________________ 9. You should see your doctor in the office for a follow-up appointment approximately two weeks after your surgery.  Your doctors nurse will typically make your follow-up appointment when she calls you with your pathology report.  Expect your pathology report 3-4 business days after your surgery.  You may call to check if you do not hear from [place] after three days. 10. OTHER INSTRUCTIONS: _______________________________________________________________________________________________ _____________________________________________________________________________________________________________________________________ _____________________________________________________________________________________________________________________________________ _____________________________________________________________________________________________________________________________________  WHEN TO CALL DR WAKEFIELD: 1. Fever over 101.0 2. Nausea and/or vomiting. 3. Extreme swelling or bruising. 4. Continued bleeding from  incision. 5. Increased pain, redness, or drainage from the incision.  The clinic staff is available to answer  your questions during regular business hours.  Please dont hesitate to call and ask to speak to one of the nurses for clinical concerns.  If you have a medical emergency, go to the nearest emergency room or call 911.  A surgeon from [facility] is always on call at the hospital.  For further questions, please visit [email or web address]    Post Anesthesia Home Care Instructions  Activity: Get plenty of rest for the remainder of the day. A responsible individual must stay with you for 24 hours following the procedure.  For the next 24 hours, DO NOT: -Drive a car -Paediatric nurse -Drink alcoholic beverages -Take any medication unless instructed by your physician -Make any legal decisions or sign important papers.  Meals: Start with liquid foods such as gelatin or soup. Progress to regular foods as tolerated. Avoid greasy, spicy, heavy foods. If nausea and/or vomiting occur, drink only clear liquids until the nausea and/or vomiting subsides. Call your physician if vomiting continues.  Special Instructions/Symptoms: Your throat may feel dry or sore from the anesthesia or the breathing tube placed in your throat during surgery. If this causes discomfort, gargle with warm salt water. The discomfort should disappear within 24 hours.  If you had a scopolamine patch placed behind your ear for the management of post- operative nausea and/or vomiting:  1. The medication in the patch is effective for 72 hours, after which it should be removed.  Wrap patch in a tissue and discard in the trash. Wash hands thoroughly with soap and water. 2. You may remove the patch earlier than 72 hours if you experience unpleasant side effects which may include dry mouth, dizziness or visual disturbances. 3. Avoid touching the patch. Wash your hands with soap and water after contact  with the patch.   Call your surgeon if you experience:   1.  Fever over 101.0. 2.  Inability to urinate. 3.  Nausea and/or vomiting. 4.  Extreme swelling or bruising at the surgical site. 5.  Continued bleeding from the incision. 6.  Increased pain, redness or drainage from the incision. 7.  Problems related to your pain medication. 8.  Any problems and/or concerns

## 2018-10-12 NOTE — Anesthesia Postprocedure Evaluation (Signed)
Anesthesia Post Note  Patient: Stephanie Chambers  Procedure(s) Performed: RADIOACTIVE SEED GUIDED EXCISIONAL RIGHT BREAST BIOPSY AND REMOVAL OF MOLE RIGHT BREAST (Right Breast) NEVUS EXCISION X TWO NECK AND CHEST (Left Chest)     Patient location during evaluation: PACU Anesthesia Type: General Level of consciousness: awake and alert and oriented Pain management: pain level controlled Vital Signs Assessment: post-procedure vital signs reviewed and stable Respiratory status: spontaneous breathing, nonlabored ventilation and respiratory function stable Cardiovascular status: blood pressure returned to baseline and stable Postop Assessment: no apparent nausea or vomiting Anesthetic complications: no    Last Vitals:  Vitals:   10/12/18 1120 10/12/18 1214  BP:  122/68  Pulse: 68 (!) 57  Resp: 17 18  Temp:  36.4 C  SpO2: 100% 100%    Last Pain:  Vitals:   10/12/18 1214  TempSrc:   PainSc: 4                  Esha Fincher A.

## 2018-10-12 NOTE — Transfer of Care (Signed)
Immediate Anesthesia Transfer of Care Note  Patient: Stephanie Chambers  Procedure(s) Performed: RADIOACTIVE SEED GUIDED EXCISIONAL RIGHT BREAST BIOPSY AND REMOVAL OF MOLE RIGHT BREAST (Right Breast) NEVUS EXCISION X TWO NECK AND CHEST (Left Chest)  Patient Location: PACU  Anesthesia Type:General  Level of Consciousness: awake, alert , oriented and patient cooperative  Airway & Oxygen Therapy: Patient Spontanous Breathing  Post-op Assessment: Report given to RN and Post -op Vital signs reviewed and stable  Post vital signs: Reviewed and stable  Last Vitals:  Vitals Value Taken Time  BP 106/55 10/12/18 1043  Temp    Pulse 77 10/12/18 1045  Resp 19 10/12/18 1045  SpO2 100 % 10/12/18 1045  Vitals shown include unvalidated device data.  Last Pain:  Vitals:   10/12/18 0839  TempSrc: Oral  PainSc: 0-No pain         Complications: No apparent anesthesia complications

## 2018-10-12 NOTE — H&P (Signed)
42 yof referred by Dr Marcelline Mates for asymmetry in the right breast. she has significant fh in her mom at age 43 who passed away last year. she also has bca in maternal cousin at age 22. she has colon cancer in dad and a mgm as well. she has invitae panel that she reports as negative. she has no mass or dc. no personal breast history. she underwent screening mm that shows c density breasts. she has bilateral asymmetries noted. on dx views there is no abnormality in the left breast. in the upper inner right breast one area is a known mole. there is also a subtle area of distortion in the medial breast US shows no correlate. no ax lad. core biopsy was done and this is discordant. clip moved a little. path is benign tissue with sclerosing adenosis. she was referred for options  Past Surgical History  Breast Biopsy  Right. Tonsillectomy   Diagnostic Studies History  Colonoscopy  never Mammogram  within last year Pap Smear  1-5 years ago  Allergies  Codeine/Codeine Derivatives  Dermatitis. Allergies Reconciled   Medication History Wellbutrin SR (100MG  Tablet ER 12HR, Oral) Active. Medications Reconciled  Pregnancy / Birth History  Age at menarche  70 years. Gravida  1 Length (months) of breastfeeding  3-6 Maternal age  65-35 Para  1 Regular periods    Review of Systems General Not Present- Appetite Loss, Chills, Fatigue, Fever, Night Sweats, Weight Gain and Weight Loss. Skin Present- Change in Wart/Mole. Not Present- Dryness, Hives, Jaundice, New Lesions, Non-Healing Wounds, Rash and Ulcer. HEENT Present- Seasonal Allergies and Wears glasses/contact lenses. Not Present- Earache, Hearing Loss, Hoarseness, Nose Bleed, Oral Ulcers, Ringing in the Ears, Sinus Pain, Sore Throat, Visual Disturbances and Yellow Eyes. Respiratory Not Present- Bloody sputum, Chronic Cough, Difficulty Breathing, Snoring and Wheezing. Breast Not Present- Breast Mass, Breast Pain, Nipple  Discharge and Skin Changes. Cardiovascular Present- Palpitations. Not Present- Chest Pain, Difficulty Breathing Lying Down, Leg Cramps, Rapid Heart Rate, Shortness of Breath and Swelling of Extremities. Gastrointestinal Not Present- Abdominal Pain, Bloating, Bloody Stool, Change in Bowel Habits, Chronic diarrhea, Constipation, Difficulty Swallowing, Excessive gas, Gets full quickly at meals, Hemorrhoids, Indigestion, Nausea, Rectal Pain and Vomiting. Female Genitourinary Not Present- Frequency, Nocturia, Painful Urination, Pelvic Pain and Urgency. Musculoskeletal Not Present- Back Pain, Joint Pain, Joint Stiffness, Muscle Pain, Muscle Weakness and Swelling of Extremities. Neurological Not Present- Decreased Memory, Fainting, Headaches, Numbness, Seizures, Tingling, Tremor, Trouble walking and Weakness. Psychiatric Present- Anxiety. Not Present- Bipolar, Change in Sleep Pattern, Depression, Fearful and Frequent crying. Endocrine Present- Hair Changes. Not Present- Cold Intolerance, Excessive Hunger, Heat Intolerance, Hot flashes and New Diabetes. Hematology Not Present- Blood Thinners, Easy Bruising, Excessive bleeding, Gland problems, HIV and Persistent Infections.   Physical Exam General Mental Status-Alert. Orientation-Oriented X3. Eye Sclera/Conjunctiva - Bilateral-No scleral icterus. Chest and Lung Exam Chest and lung exam reveals -quiet, even and easy respiratory effort with no use of accessory muscles and on auscultation, normal breath sounds, no adventitious sounds and normal vocal resonance. Breast Nipples-No Discharge. Breast Lump-No Palpable Breast Mass. Note: 5 mm upper inner quadrant Cardiovascular Cardiovascular examination reveals -normal heart sounds, regular rate and rhythm with no murmurs. Lymphatic Head & Neck General Head & Neck Lymphatics: Bilateral - Description - Normal. Axillary General Axillary Region: Bilateral - Description - Normal. Note: no [place]  adenopathy   Assessment & Plan   ABNORMAL MAMMOGRAM OF RIGHT BREAST (R92.8) Story: Right breast seed guided excisional biopsy, nevus excision we discussed options and  I recommended seed guided excision of this discordant distortion especially with fh. we discussed seed guided excision likely via periareolar incision and remove the small nevus. risks, recovery and possible options discussed. will proceed asap if this is negative will then discuss follow up as her TC risk is over 20% given fh.

## 2018-10-12 NOTE — Op Note (Signed)
Preoperative diagnosis: Right breast mass with discordant core biopsy Right breast nevus Left neck nevus Postoperative diagnosis: Same as above Procedure: 1.  Right breast seed guided excisional biopsy 2.  Excision of right breast 5 mm nevus 3.  Excision of 3 mm left neck nevus, punch biopsy Surgeon: Dr. Serita Grammes Anesthesia: General Complications: None Drains: None Specimens: 1.  Right breast mass marked with paint containing radioactive seed 2.  Right breast nevus 3.  Left neck nevus Sponge and count was correct at completion Disposition to recovery in stable condition  Indications: This a 43 year old female who presents with a family history of breast cancer and an abnormal mammogram.  She had a biopsy of a right-sided asymmetry that is discordant.  We discussed excision.  She also has 2 nevi that have changed over time on the right breast and left neck that we discussed biopsy.  Procedure: After informed consent was obtained the patient first was given antibiotics.  SCDs were in place.  She was taken to the operating room and placed under general anesthesia without complication.  She was prepped and draped in the standard sterile surgical fashion.  A surgical timeout was then performed.  I first remove the breast mass.  I located the seed with the neoprobe.  I then infiltrated Marcaine and made a periareolar incision in order to hide a scar later.  I then dissected down to the radioactive seed.  The clip had migrated so I went to remove the seed as well as a focus to take tissue medial and inferiorly after discussion with the radiologist.  I remove this in total all the way down to the pectoralis muscle as it was close.  There was a small amount of bleeding not controlled with sutures on the pectoralis.  I then passed this off the table after marking it with paint.  Mammogram confirmed removal of the radioactive seed.  I then closed this with 2-0 Vicryl, 3-0 Vicryl, and 5-0 Monocryl.   Glue and Steri-Strips were eventually applied.  I then on the left breast nevus I shaved this with a 15 blade.  I then applied some cautery to stop the bleeding from this.  I then used a 5 mm punch biopsy to excise the left neck nevus.  This was passed off the table.  I closed this with a 4-0 Monocryl.  Glue was placed on both of these as well.  She tolerated this well was extubated and transferred to recovery stable.

## 2018-10-12 NOTE — Anesthesia Procedure Notes (Signed)
Procedure Name: LMA Insertion Date/Time: 10/12/2018 9:43 AM Performed by: Raenette Rover, CRNA Pre-anesthesia Checklist: Patient identified, Emergency Drugs available, Suction available and Patient being monitored Patient Re-evaluated:Patient Re-evaluated prior to induction Oxygen Delivery Method: Circle system utilized Preoxygenation: Pre-oxygenation with 100% oxygen Induction Type: IV induction LMA: LMA inserted LMA Size: 4.0 Number of attempts: 1 Placement Confirmation: positive ETCO2,  CO2 detector and breath sounds checked- equal and bilateral Tube secured with: Tape Dental Injury: Teeth and Oropharynx as per pre-operative assessment

## 2018-10-12 NOTE — Interval H&P Note (Signed)
History and Physical Interval Note:  10/12/2018 9:21 AM  Stephanie Chambers  has presented today for surgery, with the diagnosis of RIGHT BREAST MASS.  The various methods of treatment have been discussed with the patient and family. After consideration of risks, benefits and other options for treatment, the patient has consented to  Procedure(s): RADIOACTIVE SEED GUIDED EXCISIONAL RIGHT BREAST BIOPSY AND REMOVAL OF MOLE RIGHT BREAST (Right) as a surgical intervention.  The patient's history has been reviewed, patient examined, no change in status, stable for surgery.  I have reviewed the patient's chart and labs.  Questions were answered to the patient's satisfaction.     Rolm Bookbinder

## 2018-10-13 ENCOUNTER — Encounter (HOSPITAL_BASED_OUTPATIENT_CLINIC_OR_DEPARTMENT_OTHER): Payer: Self-pay | Admitting: General Surgery

## 2018-12-11 DIAGNOSIS — Z1283 Encounter for screening for malignant neoplasm of skin: Secondary | ICD-10-CM | POA: Diagnosis not present

## 2018-12-11 DIAGNOSIS — D1800 Hemangioma unspecified site: Secondary | ICD-10-CM | POA: Diagnosis not present

## 2018-12-11 DIAGNOSIS — L814 Other melanin hyperpigmentation: Secondary | ICD-10-CM | POA: Diagnosis not present

## 2018-12-11 DIAGNOSIS — L811 Chloasma: Secondary | ICD-10-CM | POA: Diagnosis not present

## 2018-12-11 DIAGNOSIS — L650 Telogen effluvium: Secondary | ICD-10-CM | POA: Diagnosis not present

## 2018-12-11 DIAGNOSIS — L918 Other hypertrophic disorders of the skin: Secondary | ICD-10-CM | POA: Diagnosis not present

## 2018-12-11 DIAGNOSIS — L910 Hypertrophic scar: Secondary | ICD-10-CM | POA: Diagnosis not present

## 2018-12-11 DIAGNOSIS — D225 Melanocytic nevi of trunk: Secondary | ICD-10-CM | POA: Diagnosis not present

## 2018-12-11 DIAGNOSIS — L821 Other seborrheic keratosis: Secondary | ICD-10-CM | POA: Diagnosis not present

## 2019-01-03 ENCOUNTER — Ambulatory Visit (INDEPENDENT_AMBULATORY_CARE_PROVIDER_SITE_OTHER): Payer: 59 | Admitting: Obstetrics and Gynecology

## 2019-01-03 ENCOUNTER — Encounter: Payer: Self-pay | Admitting: Obstetrics and Gynecology

## 2019-01-03 ENCOUNTER — Other Ambulatory Visit: Payer: Self-pay

## 2019-01-03 VITALS — BP 114/75 | HR 58 | Ht 66.0 in | Wt 145.6 lb

## 2019-01-03 DIAGNOSIS — Z01419 Encounter for gynecological examination (general) (routine) without abnormal findings: Secondary | ICD-10-CM

## 2019-01-03 NOTE — Progress Notes (Signed)
Patient comes in today for her annual exam. Nothing is due today. No concerns today.

## 2019-01-03 NOTE — Progress Notes (Signed)
HPI:      [name] is a 43 y.o. G1P1 who LMP was Patient's last menstrual period was 12/03/2018 (approximate).  Subjective:   She presents today for her annual examination.  She has no complaints at this time.  [id number] was complicated for her by a right breast biopsy because of something found at her most recent mammogram.  Her biopsy went well and has turned out to be benign. (Her mother died of breast cancer) patient has had genetic testing for cancer and her testing was negative. Patient not using anything for birth control and does not desire birth control.  Her cycles are regular and monthly. 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    Hx: The following portions of the patient's history were reviewed and updated as appropriate:             She  has a past medical history of Anemia, Anxiety, Bulging of cervical intervertebral disc, Constipation, Depression, DUB (dysfunctional uterine bleeding), Endometrial polyp (05/2012), Epigastric abdominal pain, Lump in female breast, Mastodynia, Menorrhagia, PONV (postoperative nausea and vomiting), and UTI (lower urinary tract infection). She does not have any pertinent problems on file. She  has a past surgical history that includes Tonsillectomy (1998); Diagnostic laparoscopy (2004); Hysteroscopy; Dilation and curettage of uterus; Breast biopsy (Right, 04/24/2018); Radioactive seed guided excisional breast biopsy (Right, 10/12/2018); and Nevus excision (Left, 10/12/2018). Her family history includes Breast cancer in her cousin and mother; Cancer in her maternal grandmother; Cancer (age of onset: 33) in her mother; Cancer (age of onset: 12) in her father; Colon cancer in her father and maternal grandmother; Depression in her maternal grandmother and paternal grandmother; Heart disease in her father and paternal grandfather. She  reports that she has quit smoking. She has never used smokeless tobacco. She reports current alcohol use. She  reports that she does not use drugs. She has a current medication list which includes the following prescription(s): bupropion and loratadine. She is allergic to cefdinir; almond meal; cherry; and peach [prunus persica].       Review of Systems:  Review of Systems  Constitutional: Denied constitutional symptoms, night sweats, recent illness, fatigue, fever, insomnia and weight loss.  Eyes: Denied eye symptoms, eye pain, photophobia, vision change and visual disturbance.  Ears/Nose/Throat/Neck: Denied ear, nose, throat or neck symptoms, hearing loss, nasal discharge, sinus congestion and sore throat.  Cardiovascular: Denied cardiovascular symptoms, arrhythmia, chest pain/pressure, edema, exercise intolerance, orthopnea and palpitations.  Respiratory: Denied pulmonary symptoms, asthma, pleuritic pain, productive sputum, cough, dyspnea and wheezing.  Gastrointestinal: Denied, gastro-esophageal reflux, melena, nausea and vomiting.  Genitourinary: Denied genitourinary symptoms including symptomatic vaginal discharge, pelvic relaxation issues, and urinary complaints.  Musculoskeletal: Denied musculoskeletal symptoms, stiffness, swelling, muscle weakness and myalgia.  Dermatologic: Denied dermatology symptoms, rash and scar.  Neurologic: Denied neurology symptoms, dizziness, headache, neck pain and syncope.  Psychiatric: Denied psychiatric symptoms, anxiety and depression.  Endocrine: Denied endocrine symptoms including hot flashes and night sweats.   Meds:   Current Outpatient Medications on File Prior to Visit  Medication Sig Dispense Refill  . buPROPion (WELLBUTRIN SR) 150 MG 12 hr tablet Take 1 tablet (150 mg total) by mouth 2 (two) times daily. 120 tablet 6  . loratadine (CLARITIN) 10 MG tablet Take 10 mg by mouth daily.     No current facility-administered medications on file prior to visit.     Objective:     Vitals:   01/03/19 1526  BP: 114/75  Pulse: Marland Kitchen)  58               Physical examination General NAD, Conversant  HEENT Atraumatic; Op clear with mmm.  Normo-cephalic. Pupils reactive. Anicteric sclerae  Thyroid/Neck Smooth without nodularity or enlargement. Normal ROM.  Neck Supple.  Skin No rashes, lesions or ulceration. Normal palpated skin turgor. No nodularity.  Breasts: No masses or discharge.  Symmetric.  No axillary adenopathy.  Lungs: Clear to auscultation.No rales or wheezes. Normal Respiratory effort, no retractions.  Heart: NSR.  No murmurs or rubs appreciated. No periferal edema  Abdomen: Soft.  Non-tender.  No masses.  No HSM. No hernia  Extremities: Moves all appropriately.  Normal ROM for age. No lymphadenopathy.  Neuro: Oriented to PPT.  Normal mood. Normal affect.     Pelvic:   Vulva: Normal appearance.  No lesions.  Vagina: No lesions or abnormalities noted.  Support: Normal pelvic support.  Urethra No masses tenderness or scarring.  Meatus Normal size without lesions or prolapse.  Cervix: Normal appearance.  No lesions.  Anus: Normal exam.  No lesions.  Perineum: Normal exam.  No lesions.        Bimanual   Uterus: Normal size.  Non-tender.  Mobile.  AV.  Adnexae: No masses.  Non-tender to palpation.  Cul-de-sac: Negative for abnormality.      Assessment:    G1P1 Patient Active Problem List   Diagnosis Date Noted  . Anxiety 05/29/2018  . Peptic ulcer disease 05/29/2018  . Multiple joint pain 04/27/2017  . Other fatigue 04/27/2017  . Right upper quadrant pain 04/27/2017  . Hair loss 04/27/2017  . Family history of colon cancer in father 04/27/2017  . Pain of right breast 02/12/2016  . Family history of breast cancer in mother 02/12/2016  . Cephalalgia 10/14/2015  . PMS (premenstrual syndrome) 10/14/2015  . History of anemia 04/19/2013     1. Well woman exam with routine gynecological exam     Normal exam   Plan:            1.  Basic Screening Recommendations The basic screening recommendations for  asymptomatic women were discussed with the patient during her visit.  The age-appropriate recommendations were discussed with her and the rational for the tests reviewed.  When I am informed by the patient that another primary care physician has previously obtained the age-appropriate tests and they are up-to-date, only outstanding tests are ordered and referrals given as necessary.  Abnormal results of tests will be discussed with her when all of her results are completed. Yearly mammogram due in January Orders No orders of the defined types were placed in this encounter.   No orders of the defined types were placed in this encounter.       F/U  No follow-ups on file.  Finis Bud, M.D. 01/03/2019 4:43 PM

## 2019-01-11 ENCOUNTER — Encounter: Payer: Self-pay | Admitting: Urology

## 2019-01-11 ENCOUNTER — Other Ambulatory Visit: Payer: Self-pay | Admitting: Urology

## 2019-01-11 ENCOUNTER — Other Ambulatory Visit: Payer: Self-pay

## 2019-01-11 ENCOUNTER — Other Ambulatory Visit: Payer: 59

## 2019-01-11 DIAGNOSIS — R300 Dysuria: Secondary | ICD-10-CM | POA: Diagnosis not present

## 2019-01-11 LAB — URINALYSIS, COMPLETE
Bilirubin, UA: NEGATIVE
Glucose, UA: NEGATIVE
Ketones, UA: NEGATIVE
Nitrite, UA: NEGATIVE
Specific Gravity, UA: 1.025 (ref 1.005–1.030)
Urobilinogen, Ur: 0.2 mg/dL (ref 0.2–1.0)
pH, UA: 5 (ref 5.0–7.5)

## 2019-01-11 LAB — MICROSCOPIC EXAMINATION: WBC, UA: >30 /hpf — AB (ref 0–5)

## 2019-01-11 MED ORDER — CIPROFLOXACIN HCL 500 MG PO TABS: 500.0000 mg | ORAL_TABLET | Freq: Two times a day (BID) | ORAL | 0 refills | Status: DC

## 2019-01-11 NOTE — Progress Notes (Signed)
UA suspicious for infection-sent for culture-Cipro sent to pharmacy.

## 2019-01-12 ENCOUNTER — Other Ambulatory Visit: Payer: Self-pay | Admitting: Surgical

## 2019-01-14 LAB — CULTURE, URINE COMPREHENSIVE

## 2019-01-15 ENCOUNTER — Other Ambulatory Visit: Payer: Self-pay | Admitting: Surgical

## 2019-01-15 MED ORDER — BUPROPION HCL ER (SR) 150 MG PO TB12: 150.0000 mg | ORAL_TABLET | Freq: Two times a day (BID) | ORAL | 6 refills | Status: DC

## 2019-01-17 ENCOUNTER — Telehealth: Payer: Self-pay | Admitting: Urology

## 2019-01-17 NOTE — Telephone Encounter (Signed)
Patient notified and she will call next week and drop off a urine.

## 2019-01-17 NOTE — Telephone Encounter (Signed)
I would like a repeat UA on Stephanie Chambers next week to make sure the microscopic blood in her urine has cleared and her infection has resolved.

## 2019-01-29 ENCOUNTER — Other Ambulatory Visit: Payer: Self-pay

## 2019-01-29 ENCOUNTER — Other Ambulatory Visit: Payer: 59

## 2019-01-29 ENCOUNTER — Other Ambulatory Visit: Payer: Self-pay | Admitting: Urology

## 2019-01-29 DIAGNOSIS — R3129 Other microscopic hematuria: Secondary | ICD-10-CM

## 2019-01-29 LAB — MICROSCOPIC EXAMINATION: RBC, Urine: NONE SEEN /hpf (ref 0–2)

## 2019-01-29 LAB — URINALYSIS, COMPLETE
Bilirubin, UA: NEGATIVE
Glucose, UA: NEGATIVE
Ketones, UA: NEGATIVE
Leukocytes,UA: NEGATIVE
Nitrite, UA: NEGATIVE
Protein,UA: NEGATIVE
Specific Gravity, UA: 1.02 (ref 1.005–1.030)
Urobilinogen, Ur: 0.2 mg/dL (ref 0.2–1.0)
pH, UA: 5.5 (ref 5.0–7.5)

## 2019-01-30 ENCOUNTER — Ambulatory Visit
Admission: RE | Admit: 2019-01-30 | Discharge: 2019-01-30 | Disposition: A | Discharge status: Home / Self Care | Payer: 59 | Source: Ambulatory Visit | Attending: Urology | Admitting: Urology

## 2019-01-30 ENCOUNTER — Other Ambulatory Visit: Payer: Self-pay

## 2019-01-30 ENCOUNTER — Encounter: Payer: Self-pay | Admitting: Urology

## 2019-01-30 ENCOUNTER — Ambulatory Visit (INDEPENDENT_AMBULATORY_CARE_PROVIDER_SITE_OTHER): Payer: 59 | Admitting: Urology

## 2019-01-30 VITALS — BP 107/57 | HR 69 | Ht 66.0 in | Wt 145.0 lb

## 2019-01-30 DIAGNOSIS — R109 Unspecified abdominal pain: Secondary | ICD-10-CM | POA: Diagnosis not present

## 2019-01-30 DIAGNOSIS — N390 Urinary tract infection, site not specified: Secondary | ICD-10-CM

## 2019-01-30 LAB — MICROSCOPIC EXAMINATION: RBC: NONE SEEN /hpf (ref 0–2)

## 2019-01-30 LAB — URINALYSIS, COMPLETE
Bilirubin, UA: NEGATIVE
Glucose, UA: NEGATIVE
Ketones, UA: NEGATIVE
Leukocytes,UA: NEGATIVE
Nitrite, UA: NEGATIVE
Protein,UA: NEGATIVE
Specific Gravity, UA: 1.02 (ref 1.005–1.030)
Urobilinogen, Ur: 0.2 mg/dL (ref 0.2–1.0)
pH, UA: 6 (ref 5.0–7.5)

## 2019-01-30 MED ORDER — SULFAMETHOXAZOLE-TRIMETHOPRIM 800-160 MG PO TABS: 1.0000 | ORAL_TABLET | Freq: Two times a day (BID) | ORAL | 0 refills | Status: DC

## 2019-01-30 NOTE — Progress Notes (Signed)
01/30/2019 4:30 PM   RICCI PAFF 1975-05-23 [id number]  Referring provider: Versie Starks, PA-C [place],  [place] [address]  Chief Complaint  Patient presents with  . Flank Pain    HPI: Stephanie Chambers is a 43 year old female with a history of rUTI's who presents today with symptoms of painful urination.  Background history Patient is a 37 -year-old Caucasian female who is referred to [place] by, Ashok Cordia, PA-C, for recurrent urinary tract infections.  She states she has a remote history of rUTI's, but she has not had any problems for over one year until now.  Reviewing her records,  she has had no documented UTI's.   Her symptoms with a urinary tract infection consist of urgency, dysuria and suprapubic pressure.  She does not have a history of nephrolithiasis.  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   She does not engage in anal sex.  She is not postmenopausal.  She denies constipation and/or diarrhea.  She not had any recent imaging studies.   She is drinking a lot of water daily.   She drinks alcohol, sodas and teas occasionally when she goes out to eat.    + Enterococcus faecalis (low colony count due to patient taking antibiotics at the time of culture) resistant to tetracycline on 05/30/2018 + E. Coli resistant to ampicillin on 01/11/2019  On 01/11/2019, she dropped off an urine specimen for symptoms of LBP, dysuria and malaise.  Her UA demonstrated nitrite negative, > 30 WBC's, 11-30 RBC's and many bacteria.  Urine culture + for E. Coli.    She was advised to drop off another urine after completing her antibiotic, so that it could be rechecked for microscopic hematuria.     She stated she was no better when she came to drop off the UA specimen.  Today, she is having dysuria, suprapubic pain, urgency and lower and upper back pain.  She has no aggravating factors or modifying factors.   Patient denies any gross hematuria.  Patient denies any fevers, chills, nausea or vomiting.  Her UA was nitrite negative and moderate bacteria.  She is not engaging in sexual intercourse at this time and still continues to drink a good amount of water.  KUB on 01/30/2019 radiologist did not identify a stone.     PMH: Past Medical History:  Diagnosis Date  . Anemia   . Anxiety   . Bulging of cervical intervertebral disc   . Constipation   . Depression    major  . DUB (dysfunctional uterine bleeding)   . Endometrial polyp 05/2012   hysteroscopy w/ d &C  . Epigastric abdominal pain   . Lump in female breast   . Mastodynia   . Menorrhagia   . PONV (postoperative nausea and vomiting)   . UTI (lower urinary tract infection)     Surgical History: Past Surgical History:  Procedure Laterality Date  . BREAST BIOPSY Right 04/24/2018   Affirm Bx- X-clip, path pending  . DIAGNOSTIC LAPAROSCOPY  2004  . DILATION AND CURETTAGE OF UTERUS    . HYSTEROSCOPY    . NEVUS EXCISION Left 10/12/2018   Procedure: NEVUS EXCISION X TWO NECK AND CHEST;  Surgeon: Rolm Bookbinder, MD;  Location: [facility];  Service: General;  Laterality: Left;  . RADIOACTIVE SEED GUIDED EXCISIONAL BREAST BIOPSY Right 10/12/2018   Procedure: RADIOACTIVE SEED GUIDED EXCISIONAL  RIGHT BREAST BIOPSY AND REMOVAL OF MOLE RIGHT BREAST;  Surgeon: Rolm Bookbinder, MD;  Location: [facility];  Service: General;  Laterality: Right;  . TONSILLECTOMY  1998    Home Medications:  Allergies as of 01/30/2019      Reactions   Cefdinir Shortness Of Breath   Almond Meal Itching   Cherry Itching   Peach [prunus Persica] Itching      Medication List       Accurate as of January 30, 2019  4:30 PM. If you have any questions, ask your nurse or doctor.        ALPRAZolam 0.5 MG tablet Commonly known as: XANAX   buPROPion 150 MG 12 hr tablet Commonly known as: WELLBUTRIN SR Take 1 tablet (150 mg  total) by mouth 2 (two) times daily.   ciprofloxacin 500 MG tablet Commonly known as: CIPRO Take 1 tablet (500 mg total) by mouth every 12 (twelve) hours.   loratadine 10 MG tablet Commonly known as: CLARITIN Take 10 mg by mouth daily.   sulfamethoxazole-trimethoprim 800-160 MG tablet Commonly known as: BACTRIM DS Take 1 tablet by mouth every 12 (twelve) hours. Started by: Zara Council, PA-C       Allergies:  Allergies  Allergen Reactions  . Cefdinir Shortness Of Breath  . Almond Meal Itching  . Cherry Itching  . Peach [Prunus Persica] Itching    Family History: Family History  Problem Relation Age of Onset  . Cancer Mother 55       breast with liver mets  . Breast cancer Mother   . Cancer Father 36       colon, lung, kidney  . Heart disease Father   . Colon cancer Father   . Depression Maternal Grandmother   . Cancer Maternal Grandmother        colon  . Colon cancer Maternal Grandmother   . Depression Paternal Grandmother   . Heart disease Paternal Grandfather   . Breast cancer Cousin   . Ovarian cancer Neg Hx     Social History:  reports that she has quit smoking. She has never used smokeless tobacco. She reports current alcohol use. She reports that she does not use drugs.  ROS: UROLOGY Frequent Urination?: No Hard to postpone urination?: No Burning/pain with urination?: Yes Get up at night to urinate?: No Leakage of urine?: No Urine stream starts and stops?: No Trouble starting stream?: No Do you have to strain to urinate?: No Blood in urine?: No Urinary tract infection?: Yes Sexually transmitted disease?: No Injury to kidneys or bladder?: No Painful intercourse?: No Weak stream?: No Currently pregnant?: No Vaginal bleeding?: No Last menstrual period?: n  Gastrointestinal Nausea?: No Vomiting?: No Indigestion/heartburn?: No Diarrhea?: No Constipation?: No  Constitutional Fever: No Night sweats?: Yes Weight loss?: No Fatigue?: No   Skin Skin rash/lesions?: No Itching?: No  Eyes Blurred vision?: No Double vision?: No  Ears/Nose/Throat Sore throat?: No Sinus problems?: No  Hematologic/Lymphatic Swollen glands?: No Easy bruising?: No  Cardiovascular Leg swelling?: No Chest pain?: No  Respiratory Cough?: No Shortness of breath?: No  Endocrine Excessive thirst?: No  Musculoskeletal Back pain?: Yes Joint pain?: No  Neurological Headaches?: Yes Dizziness?: Yes  Psychologic Depression?: No Anxiety?: No  Physical Exam: BP (!) 107/57   Pulse 69   Ht 5' 6"  (1.676 m)   Wt 145 lb (65.8 kg)   BMI 23.40 kg/m   Constitutional:  Well nourished. Alert and oriented, No acute distress. HEENT: [place] AT, moist  mucus membranes.  Trachea midline, no masses. Cardiovascular: No clubbing, cyanosis, or edema. Respiratory: Normal respiratory effort, no increased work of breathing. Neurologic: Grossly intact, no focal deficits, moving all 4 extremities. Psychiatric: Normal mood and affect.   Laboratory Data: Lab Results  Component Value Date   WBC 4.2 11/16/2017   HGB 11.6 11/16/2017   HCT 35.3 11/16/2017   MCV 92 11/16/2017   PLT 234 11/16/2017    Lab Results  Component Value Date   CREATININE 0.95 11/16/2017    No results found for: PSA  No results found for: TESTOSTERONE  No results found for: HGBA1C  Lab Results  Component Value Date   TSH 1.250 11/16/2017       Component Value Date/Time   CHOL 213 (H) 11/16/2017 1509   HDL 93 11/16/2017 1509   CHOLHDL 2.3 11/16/2017 1509   LDLCALC 111 (H) 11/16/2017 1509    Lab Results  Component Value Date   AST 14 11/16/2017   Lab Results  Component Value Date   ALT 10 11/16/2017   No components found for: ALKALINEPHOPHATASE No components found for: BILIRUBINTOTAL  No results found for: ESTRADIOL  Urinalysis Component     Latest Ref Rng & Units 01/30/2019  Specific Gravity, UA     1.005 - 1.030 1.020  pH, UA     5.0 - 7.5 6.0   Color, UA     Yellow Yellow  Appearance Ur     Clear Hazy (A)  Leukocytes,UA     Negative Negative  Protein,UA     Negative/Trace Negative  Glucose, UA     Negative Negative  Ketones, UA     Negative Negative  RBC, UA     Negative Trace (A)  Bilirubin, UA     Negative Negative  Urobilinogen, Ur     0.2 - 1.0 mg/dL 0.2  Nitrite, UA     Negative Negative  Microscopic Examination      See below:   Component     Latest Ref Rng & Units 01/30/2019  WBC, UA     0 - 5 /hpf 0-5  RBC     0 - 2 /hpf None seen  Epithelial Cells (non renal)     0 - 10 /hpf 0-10  Bacteria, UA     None seen/Few Moderate (A)   I have reviewed the labs.  Pertinent Imaging CLINICAL DATA:  Right flank pain for the past month. No history of kidney stones.  EXAM: ABDOMEN - 1 VIEW  COMPARISON:  CT abdomen pelvis dated March 06, 2014.  FINDINGS: The bowel gas pattern is normal. No radio-opaque calculi or other significant radiographic abnormality are seen. No acute osseous abnormality.  IMPRESSION: Negative.   Electronically Signed   By: Titus Dubin M.D.   On: 01/31/2019 10:21 I have independently reviewed the films and ? Of a right renal stone but area is overlayed with stool and gas    Assessment & Plan:    1. rUTI's Criteria for recurrent UTI has been met with 2 or more infections in 6 months or 3 or greater infections in one year  She is an adequate amount of water - occasional alcohol, sodas and tea  Discussed pursuing a CT scan at this time to further evaluate for a nidus for her rUTI's  - she is hesitant to schedule at this time as she has medical bills from a recent breast biopsy  2. Flank pain KUB did not clearly identify any stones                       Return for pending urine culture .  These notes generated with voice recognition software. I apologize for typographical errors.  Zara Council, PA-C  [facility]  [address]  [place], [place] [address] [phone]

## 2019-02-04 LAB — CULTURE, URINE COMPREHENSIVE

## 2019-02-05 ENCOUNTER — Other Ambulatory Visit: Payer: Self-pay | Admitting: Urology

## 2019-02-05 MED ORDER — CIPROFLOXACIN HCL 500 MG PO TABS: 500.0000 mg | ORAL_TABLET | Freq: Two times a day (BID) | ORAL | 0 refills | Status: DC

## 2019-02-05 NOTE — Telephone Encounter (Signed)
This encounter was created in error - please disregard.

## 2019-04-18 DIAGNOSIS — H52223 Regular astigmatism, bilateral: Secondary | ICD-10-CM | POA: Diagnosis not present

## 2019-05-10 ENCOUNTER — Other Ambulatory Visit: Payer: Self-pay | Admitting: Obstetrics and Gynecology

## 2019-05-10 ENCOUNTER — Telehealth: Payer: Self-pay | Admitting: Obstetrics and Gynecology

## 2019-05-10 DIAGNOSIS — Z1231 Encounter for screening mammogram for malignant neoplasm of breast: Secondary | ICD-10-CM

## 2019-05-10 NOTE — Telephone Encounter (Signed)
Pt needs a order sent to [facility] for a mammorgram. Pt is requesting a call when the order is done the pt is trying to get a appointment there. Please advise

## 2019-05-10 NOTE — Telephone Encounter (Signed)
[facility] paced the order and scheduled the appointment.

## 2019-06-01 ENCOUNTER — Ambulatory Visit
Admission: RE | Admit: 2019-06-01 | Discharge: 2019-06-01 | Disposition: A | Discharge status: Home / Self Care | Payer: 59 | Source: Ambulatory Visit | Attending: Obstetrics and Gynecology | Admitting: Obstetrics and Gynecology

## 2019-06-01 DIAGNOSIS — Z1231 Encounter for screening mammogram for malignant neoplasm of breast: Secondary | ICD-10-CM | POA: Insufficient documentation

## 2019-10-31 DIAGNOSIS — R0981 Nasal congestion: Secondary | ICD-10-CM | POA: Diagnosis not present

## 2019-10-31 DIAGNOSIS — J019 Acute sinusitis, unspecified: Secondary | ICD-10-CM | POA: Diagnosis not present

## 2020-05-08 ENCOUNTER — Encounter: Payer: 59 | Admitting: Obstetrics and Gynecology

## 2020-05-27 ENCOUNTER — Other Ambulatory Visit: Payer: Self-pay

## 2020-05-27 ENCOUNTER — Encounter: Payer: Self-pay | Admitting: Obstetrics and Gynecology

## 2020-05-27 ENCOUNTER — Other Ambulatory Visit (HOSPITAL_COMMUNITY): Payer: Self-pay | Admitting: Obstetrics and Gynecology

## 2020-05-27 ENCOUNTER — Other Ambulatory Visit (HOSPITAL_COMMUNITY)
Admission: RE | Admit: 2020-05-27 | Discharge: 2020-05-27 | Disposition: A | Discharge status: Home / Self Care | Payer: 59 | Source: Ambulatory Visit | Attending: Obstetrics and Gynecology | Admitting: Obstetrics and Gynecology

## 2020-05-27 ENCOUNTER — Ambulatory Visit (INDEPENDENT_AMBULATORY_CARE_PROVIDER_SITE_OTHER): Payer: 59 | Admitting: Obstetrics and Gynecology

## 2020-05-27 VITALS — BP 94/64 | HR 76 | Resp 16 | Wt 142.6 lb

## 2020-05-27 DIAGNOSIS — Z1231 Encounter for screening mammogram for malignant neoplasm of breast: Secondary | ICD-10-CM

## 2020-05-27 DIAGNOSIS — Z124 Encounter for screening for malignant neoplasm of cervix: Secondary | ICD-10-CM | POA: Insufficient documentation

## 2020-05-27 DIAGNOSIS — Z114 Encounter for screening for human immunodeficiency virus [HIV]: Secondary | ICD-10-CM

## 2020-05-27 DIAGNOSIS — Z01419 Encounter for gynecological examination (general) (routine) without abnormal findings: Secondary | ICD-10-CM | POA: Diagnosis not present

## 2020-05-27 DIAGNOSIS — Z803 Family history of malignant neoplasm of breast: Secondary | ICD-10-CM | POA: Diagnosis not present

## 2020-05-27 DIAGNOSIS — Z1159 Encounter for screening for other viral diseases: Secondary | ICD-10-CM

## 2020-05-27 MED ORDER — BUPROPION HCL ER (SR) 150 MG PO TB12: 150.0000 mg | ORAL_TABLET | Freq: Two times a day (BID) | ORAL | 6 refills | Status: DC

## 2020-05-27 NOTE — Progress Notes (Signed)
HPI:      [name] is a 45 y.o. G1P1 who LMP was No LMP recorded.  Subjective:   She presents today for her annual examination.  She has begun to have significant menopausal symptoms including hot flashes night sweats hair loss as well as mood changes involving anxiety.  She has stopped having menstrual periods regularly.  She has had 3 menses in the last 8 months. Of significant note patient's mother died of breast cancer -ER/PR positive.  Patient has a strong family history of colon cancer.  She has not yet had colonoscopy. She has been genetically tested for BRCA and colon cancer gene and these were both negative. Stephanie Chambers works in the [place] center at [employer]. She has some trepidation regarding beginning HRT.     Hx: The following portions of the patient's history were reviewed and updated as appropriate:             She  has a past medical history of Anemia, Anxiety, Bulging of cervical intervertebral disc, Constipation, Depression, DUB (dysfunctional uterine bleeding), Endometrial polyp (05/2012), Epigastric abdominal pain, Lump in female breast, Mastodynia, Menorrhagia, PONV (postoperative nausea and vomiting), and UTI (lower urinary tract infection). She does not have any pertinent problems on file. She  has a past surgical history that includes Tonsillectomy (1998); Diagnostic laparoscopy (2004); Hysteroscopy; Dilation and curettage of uterus; Radioactive seed guided excisional breast biopsy (Right, 10/12/2018); Nevus excision (Left, 10/12/2018); Breast biopsy (Right, 04/24/2018); and Breast excisional biopsy (Right, 10/11/2018). Her family history includes Breast cancer in her cousin and mother; Cancer in her maternal grandmother; Cancer (age of onset: 65) in her mother; Cancer (age of onset: 81) in her father; Colon cancer in her father and maternal grandmother; Depression in her maternal grandmother and paternal grandmother; Heart disease in her father and paternal  grandfather. She  reports that she has quit smoking. She has never used smokeless tobacco. She reports current alcohol use. She reports that she does not use drugs. She has a current medication list which includes the following prescription(s): loratadine and bupropion. She is allergic to cefdinir, almond meal, cherry, and peach [prunus persica].       Review of Systems:  Review of Systems  Constitutional: Denied constitutional symptoms, night sweats, recent illness, fatigue, fever, insomnia and weight loss.  Eyes: Denied eye symptoms, eye pain, photophobia, vision change and visual disturbance.  Ears/Nose/Throat/Neck: Denied ear, nose, throat or neck symptoms, hearing loss, nasal discharge, sinus congestion and sore throat.  Cardiovascular: Denied cardiovascular symptoms, arrhythmia, chest pain/pressure, edema, exercise intolerance, orthopnea and palpitations.  Respiratory: Denied pulmonary symptoms, asthma, pleuritic pain, productive sputum, cough, dyspnea and wheezing.  Gastrointestinal: Denied, gastro-esophageal reflux, melena, nausea and vomiting.  Genitourinary: Denied genitourinary symptoms including symptomatic vaginal discharge, pelvic relaxation issues, and urinary complaints.  Musculoskeletal: Denied musculoskeletal symptoms, stiffness, swelling, muscle weakness and myalgia.  Dermatologic: Denied dermatology symptoms, rash and scar.  Neurologic: Denied neurology symptoms, dizziness, headache, neck pain and syncope.  Psychiatric: Denied psychiatric symptoms, anxiety and depression.  Endocrine: See HPI for additional information.   Meds:   Current Outpatient Medications on File Prior to Visit  Medication Sig Dispense Refill  . loratadine (CLARITIN) 10 MG tablet Take 10 mg by mouth daily.     No current facility-administered medications on file prior to visit.          Objective:     Vitals:   05/27/20 1435  BP: 94/64  Pulse: 76  Resp: 16    Filed  Weights    05/27/20 1435  Weight: 142 lb 9.6 oz (64.7 kg)              Physical examination General NAD, Conversant  HEENT Atraumatic; Op clear with mmm.  Normo-cephalic. Pupils reactive. Anicteric sclerae  Thyroid/Neck Smooth without nodularity or enlargement. Normal ROM.  Neck Supple.  Skin No rashes, lesions or ulceration. Normal palpated skin turgor. No nodularity.  Breasts: No masses or discharge.  Symmetric.  No axillary adenopathy.  Lungs: Clear to auscultation.No rales or wheezes. Normal Respiratory effort, no retractions.  Heart: NSR.  No murmurs or rubs appreciated. No periferal edema  Abdomen: Soft.  Non-tender.  No masses.  No HSM. No hernia  Extremities: Moves all appropriately.  Normal ROM for age. No lymphadenopathy.  Neuro: Oriented to PPT.  Normal mood. Normal affect.     Pelvic:   Vulva: Normal appearance.  No lesions.  Vagina: No lesions or abnormalities noted.  Support: Normal pelvic support.  Urethra No masses tenderness or scarring.  Meatus Normal size without lesions or prolapse.  Cervix: Normal appearance.  No lesions.  Anus: Normal exam.  No lesions.  Perineum: Normal exam.  No lesions.        Bimanual   Uterus: Normal size.  Non-tender.  Mobile.  AV.  Adnexae: No masses.  Non-tender to palpation.  Cul-de-sac: Negative for abnormality.      Assessment:    G1P1 Patient Active Problem List   Diagnosis Date Noted  . Anxiety 05/29/2018  . Peptic ulcer disease 05/29/2018  . Multiple joint pain 04/27/2017  . Other fatigue 04/27/2017  . Right upper quadrant pain 04/27/2017  . Hair loss 04/27/2017  . Family history of colon cancer in father 04/27/2017  . Pain of right breast 02/12/2016  . Family history of breast cancer in mother 02/12/2016  . Cephalalgia 10/14/2015  . PMS (premenstrual syndrome) 10/14/2015  . History of anemia 04/19/2013     1. Encounter for screening mammogram for malignant neoplasm of breast   2. Well woman exam with routine gynecological  exam   3. Family history of breast cancer     Patient in menopause/perimenopause based on decreasing menstrual periods and symptom picture.  She is somewhat reluctant to begin HRT because of her family history and her current occupation.  She is willing to consider her options though.   Plan:            1.  Basic Screening Recommendations The basic screening recommendations for asymptomatic women were discussed with the patient during her visit.  The age-appropriate recommendations were discussed with her and the rational for the tests reviewed.  When I am informed by the patient that another primary care physician has previously obtained the age-appropriate tests and they are up-to-date, only outstanding tests are ordered and referrals given as necessary.  Abnormal results of tests will be discussed with her when all of her results are completed.  Routine preventative health maintenance measures emphasized: Exercise/Diet/Weight control, Tobacco Warnings, Alcohol/Substance use risks and Stress Management Pap performed at patient request, mammogram ordered lab work ordered Recommend colonoscopy Literature given regarding early menopause and use of estrogen.  Beginning discussions regarding HRT.  Patient to schedule if she desires further discussion regarding HRT.  Alternatives to potent progesterone discussed including Mirena IUD and human progesterone. Orders Orders Placed This Encounter  Procedures  . MM 3D SCREEN BREAST BILATERAL  . Lipid panel  . TSH  . Hemoglobin A1c  . CBC  Meds ordered this encounter  Medications  . buPROPion (WELLBUTRIN SR) 150 MG 12 hr tablet    Sig: Take 1 tablet (150 mg total) by mouth 2 (two) times daily.    Dispense:  120 tablet    Refill:  6            F/U  Return in about 1 year (around 05/27/2021) for Annual Physical.  Finis Bud, M.D. 05/27/2020 3:37 PM

## 2020-05-27 NOTE — Addendum Note (Signed)
Addended by: Chilton Greathouse on: 05/27/2020 03:44 PM   Modules accepted: Orders

## 2020-05-28 ENCOUNTER — Telehealth: Payer: Self-pay

## 2020-05-28 NOTE — Telephone Encounter (Signed)
Pt called in and stated that she was seen yesterday, the pt stated that she had numbness in her finger tips and she wants to know does that come from menopause. I told the pt I will send a message to the nurse and to please allow 24-48 hours for a reply. Please advise

## 2020-05-29 NOTE — Telephone Encounter (Signed)
Spoke to pt concerning her call to the office. Pt stated that her symptoms are really bad and is happening a lot more often. Pt was informed that numbness in finger could or could not be caused by menopause. Pt was advised that due to her symptoms that she needed to follow up with her PCP.  Pt stated that PCP is not longer a doctor and do not have a PCP at this time. Pt stated that she had been doing research and read that it was caused by menopause and she wanted to see what Dr. Amalia Hailey suggested that would help. Pt requested that Dr. Amalia Hailey review her last visit and suggest treatment. Pt is awaiting a message from Dr. Amalia Hailey.

## 2020-05-29 NOTE — Telephone Encounter (Signed)
Please advise. Thanks Xzaria Teo 

## 2020-05-30 LAB — CYTOLOGY - PAP
Comment: NEGATIVE
Diagnosis: NEGATIVE
High risk HPV: NEGATIVE

## 2020-06-03 ENCOUNTER — Other Ambulatory Visit: Payer: Self-pay

## 2020-06-03 ENCOUNTER — Other Ambulatory Visit: Payer: 59

## 2020-06-03 DIAGNOSIS — Z01419 Encounter for gynecological examination (general) (routine) without abnormal findings: Secondary | ICD-10-CM | POA: Diagnosis not present

## 2020-06-04 LAB — CBC
Hematocrit: 40.9 % (ref 34.0–46.6)
Hemoglobin: 13.7 g/dL (ref 11.1–15.9)
MCH: 32.2 pg (ref 26.6–33.0)
MCHC: 33.5 g/dL (ref 31.5–35.7)
MCV: 96 fL (ref 79–97)
Platelets: 206 10*3/uL (ref 150–450)
RBC: 4.25 x10E6/uL (ref 3.77–5.28)
RDW: 12.1 % (ref 11.7–15.4)
WBC: 4.3 10*3/uL (ref 3.4–10.8)

## 2020-06-04 LAB — LIPID PANEL
Chol/HDL Ratio: 2.6 ratio (ref 0.0–4.4)
Cholesterol, Total: 198 mg/dL (ref 100–199)
HDL: 77 mg/dL (ref >39)
LDL Chol Calc (NIH): 110 mg/dL — ABNORMAL HIGH (ref 0–99)
Triglycerides: 57 mg/dL (ref 0–149)
VLDL Cholesterol Cal: 11 mg/dL (ref 5–40)

## 2020-06-04 LAB — HEMOGLOBIN A1C
Est. average glucose Bld gHb Est-mCnc: 100 mg/dL
Hgb A1c MFr Bld: 5.1 % (ref 4.8–5.6)

## 2020-06-04 LAB — TSH: TSH: 1.04 u[IU]/mL (ref 0.450–4.500)

## 2020-06-05 NOTE — Progress Notes (Signed)
Cardiology Office Note:    Date:  06/06/2020   ID:  TANGEE Chambers, DOB [birth year], MRN [id number]  PCP:  Patient, No Pcp Per  [facility] Cardiologist:  Dr. Darlis Loan [facility] Electrophysiologist:  None   Referring MD: Versie Starks, PA-C   Chief Complaint: chest pain, numbness and tingling in the fingers  History of Present Illness:    Stephanie Chambers is a 45 y.o. female with a hx of prior tobacco abuse in her 60s, family history of CAD in her father who is s/p CABG who presents with chest pain.   The patient was last seen in 10/2017 after an ER follow-up for chest pain with normal work-up. The patient reported palpitations and a 14 day Zio was ordered. She also reported atypical chest pain and a stress echo. Heart monitor showed NSR with average heart rate of 66, 1 run of SVT, (5beats), 4 beat run of slow ventricular tachycardia, events did not correlate with symptoms. Echo stress 12/2016 was normal with good exercise intolerance and normal BP response.   Today, the patient reports tingling and numbness in her fingertips for the last 6 months. It started after she received the second covid vaccine in August or September 2021. It seems that sxs are getting worse. Also has some numbness and tingling in her feet. She denies any injury to neck or back. Reported prior MRI with possible bulging discs in her neck. Also reported neck pain and jaw pain that started more recently. Pain radiates down into the chest and some in the arm. Chest pain can be sharp or dull. It is constant. Pain is worse in the morning. It is not worse with exertion. She ran a mile last week and did not have worsening chest discomfort. She does get sweaty, but is also having hot flashes. She has tried ibuprofen and this hasn't improved the pain. Some of the pain is reproducible in her neck when she moves her head. Patient regularly works out, but since the chest pain started, she has stopped.  She had a physical last  week with her OBGYN and mentioned symptoms to them, and they felt it was not menopause sxs. Patient denies nausea, vomiting, abd pain, diarrhea, palpitations, fever, chills, lower leg edema, orthopnea. No other changes in medications or pmh.    EKG shows SR with no ischemic changes. Vitals are wnl. During the visit she reports active dull neck and chest pain. Due to the fact she was having chest pain I recommended an ER visit, but patient declined. I also offered stat labs and stress test, but patient declined this as well.   Past Medical History:  Diagnosis Date  . Anemia   . Anxiety   . Bulging of cervical intervertebral disc   . Constipation   . Depression    major  . DUB (dysfunctional uterine bleeding)   . Endometrial polyp 05/2012   hysteroscopy w/ d &C  . Epigastric abdominal pain   . Lump in female breast   . Mastodynia   . Menorrhagia   . PONV (postoperative nausea and vomiting)   . UTI (lower urinary tract infection)     Past Surgical History:  Procedure Laterality Date  . BREAST BIOPSY Right 04/24/2018   Affirm Bx- X-clip,  FOCAL SCLEROSING ADENOSIS WITH ASSOCIATED CALCIFICATIONS  . BREAST EXCISIONAL BIOPSY Right 10/11/2018   MILD FIBROCYSTIC CHANGES  . DIAGNOSTIC LAPAROSCOPY  2004  . DILATION AND CURETTAGE OF UTERUS    . HYSTEROSCOPY    .  NEVUS EXCISION Left 10/12/2018   Procedure: NEVUS EXCISION X TWO NECK AND CHEST;  Surgeon: Rolm Bookbinder, MD;  Location: [place];  Service: General;  Laterality: Left;  . RADIOACTIVE SEED GUIDED EXCISIONAL BREAST BIOPSY Right 10/12/2018   Procedure: RADIOACTIVE SEED GUIDED EXCISIONAL RIGHT BREAST BIOPSY AND REMOVAL OF MOLE RIGHT BREAST;  Surgeon: Rolm Bookbinder, MD;  Location: [place];  Service: General;  Laterality: Right;  . TONSILLECTOMY  1998    Current Medications: Current Meds  Medication Sig  . buPROPion (WELLBUTRIN SR) 150 MG 12 hr tablet Take 1 tablet (150 mg total) by mouth 2  (two) times daily.  Marland Kitchen loratadine (CLARITIN) 10 MG tablet Take 10 mg by mouth daily.  . vitamin E 180 MG (400 UNITS) capsule Take 800 Units by mouth daily.     Allergies:   Cefdinir, Almond meal, Cherry, and Peach [prunus persica]   Social History   Socioeconomic History  . Marital status: Married    Spouse name: Not on file  . Number of children: Not on file  . Years of education: Not on file  . Highest education level: Not on file  Occupational History  . Not on file  Tobacco Use  . Smoking status: Former Research scientist (life sciences)  . Smokeless tobacco: Never Used  Vaping Use  . Vaping Use: Never used  Substance and Sexual Activity  . Alcohol use: Yes    Alcohol/week: 0.0 standard drinks    Comment: occassional  . Drug use: No  . Sexual activity: Yes    Birth control/protection: None  Other Topics Concern  . Not on file  Social History Narrative  . Not on file   Social Determinants of Health   Financial Resource Strain: Not on file  Food Insecurity: Not on file  Transportation Needs: Not on file  Physical Activity: Not on file  Stress: Not on file  Social Connections: Not on file     Family History: The patient's *family history includes Breast cancer in her cousin and mother; Cancer in her maternal grandmother; Cancer (age of onset: 1) in her mother; Cancer (age of onset: 48) in her father; Colon cancer in her father and maternal grandmother; Depression in her maternal grandmother and paternal grandmother; Heart disease in her father and paternal grandfather; Hypertension in her brother. There is no history of Ovarian cancer.  ROS:   Please see the history of present illness.    All other systems reviewed and are negative.  EKGs/Labs/Other Studies Reviewed:    The following studies were reviewed today:  Heart monitor 10/2017  Normal sinus rhythm with average heart rate of 66 bpm. 4 beat run of slow ventricular tachycardia at a rate of 130 bpm 1 SVT run lasting 5  beats. Reported patient's events did not correlate with significant arrhythmia except for the episode of slow ventricular tachycardia. Consider stress testing.  Stress echo 12/2017 Study Conclusions   - Left ventricle: Systolic function was normal. The estimated  ejection fraction was in the range of 55% to 60%.  - Mitral valve: There was mild regurgitation.  - Stress ECG conclusions: There were no stress arrhythmias or  conduction abnormalities. The stress ECG was normal.  - Staged echo: Left ventricular ejection fraction was normal at  rest and with stress. There was no echocardiographic evidence for  stress-induced ischemia.  - Peak stress: The estimated LV ejection fraction was 65-70%. No  evidence for new LV regional wall motion abnormalities.  - Target heart rate  achieved. Normal blood pressure response to  exercise and recovery. Heart rate 100 BPM at 3 min in recovery.   Impressions:   - Normal study after maximal exercise.   EKG:  EKG is  ordered today.  The ekg ordered today demonstrates NSR, 78bpm, TW changes (old)  Recent Labs: 06/03/2020: Hemoglobin 13.7; Platelets 206; TSH 1.040  Recent Lipid Panel    Component Value Date/Time   CHOL 198 06/03/2020 1033   TRIG 57 06/03/2020 1033   HDL 77 06/03/2020 1033   CHOLHDL 2.6 06/03/2020 1033   LDLCALC 110 (H) 06/03/2020 1033     Risk Assessment/Calculations:       Physical Exam:    VS:  BP 110/80 (BP Location: Left Arm, Patient Position: Sitting, Cuff Size: Normal)   Pulse 78   Ht 5\' 6"  (1.676 m)   Wt 144 lb (65.3 kg)   SpO2 98%   BMI 23.24 kg/m     Wt Readings from Last 3 Encounters:  06/06/20 144 lb (65.3 kg)  05/27/20 142 lb 9.6 oz (64.7 kg)  01/30/19 145 lb (65.8 kg)     GEN:  Well nourished, well developed in no acute distress HEENT: Normal NECK: No JVD; No carotid bruits LYMPHATICS: No lymphadenopathy CARDIAC: RRR, no murmurs, rubs, gallops RESPIRATORY:  Clear to auscultation without  rales, wheezing or rhonchi  ABDOMEN: Soft, non-tender, non-distended MUSCULOSKELETAL:  No edema; No deformity  SKIN: Warm and dry NEUROLOGIC:  Alert and oriented x 3 PSYCHIATRIC:  Normal affect   ASSESSMENT:    1. Chest pain, unspecified type   2. Numbness and tingling    PLAN:    In order of problems listed above:  Atypical chest pain Patient reports neck and jaw pain along with constant dull chest pain. It is more atypical. Not worse with exertion. No associated symptoms. Some neck pain is reproducible with movement. A week ago patient ran a mile and had no chest pain. Breathing is normal. Pain is not improved with ibuprofen. EKG today show SR with no new changes. Vitals are stable. RF for CAD include family history, otherwise patient is very healthy. She regularly exercises. Patient did had COVID at the beginning of the year, but had been exercising since then without symptoms. During the visit the patient reported she had active dull chest and neck pain. Given active pain, I recommended the patient go to the ER for further work-up, but she declined. I also offered stat troponin and stress test, but this was also declined.  Upper extremity numbness and tingling This started after second covid vaccine 6-8 months ago, she feel it's getting worse. No known injuries. Possible bulging discs in the cervical region. Strength and sensation are normal on exam. Do not suspect this is cardiac. I recommended she see her PCP for further work-up.   Disposition: Follow up prn with APP/MD   Shared Decision Making/Informed Consent        Signed, Kesleigh Morson Ninfa Meeker, PA-C  06/06/2020 [place]

## 2020-06-06 ENCOUNTER — Other Ambulatory Visit: Payer: Self-pay

## 2020-06-06 ENCOUNTER — Encounter: Payer: Self-pay | Admitting: Physician Assistant

## 2020-06-06 ENCOUNTER — Ambulatory Visit (INDEPENDENT_AMBULATORY_CARE_PROVIDER_SITE_OTHER): Payer: 59 | Admitting: Medical

## 2020-06-06 VITALS — BP 110/80 | HR 78 | Ht 66.0 in | Wt 144.0 lb

## 2020-06-06 DIAGNOSIS — R079 Chest pain, unspecified: Secondary | ICD-10-CM | POA: Diagnosis not present

## 2020-06-06 DIAGNOSIS — R202 Paresthesia of skin: Secondary | ICD-10-CM

## 2020-06-06 DIAGNOSIS — R200 Anesthesia of skin: Secondary | ICD-10-CM | POA: Diagnosis not present

## 2020-06-06 NOTE — Telephone Encounter (Signed)
LM for patient to return call.

## 2020-06-06 NOTE — Patient Instructions (Signed)
Medication Instructions:   NONE  *If you need a refill on your cardiac medications before your next appointment, please call your pharmacy*   Lab Work:  NONE  If you have labs (blood work) drawn today and your tests are completely normal, you will receive your results only by: Marland Kitchen MyChart Message (if you have MyChart) OR . A paper copy in the mail If you have any lab test that is abnormal or we need to change your treatment, we will call you to review the results.   Testing/Procedures:  NONE   Follow-Up: At [facility], you and your health needs are our priority.  As part of our continuing mission to provide you with exceptional heart care, we have created designated Provider Care Teams.  These Care Teams include your primary Cardiologist (physician) and Advanced Practice Providers (APPs -  Physician Assistants and Nurse Practitioners) who all work together to provide you with the care you need, when you need it.  We recommend signing up for the patient portal called "MyChart".  Sign up information is provided on this After Visit Summary.  MyChart is used to connect with patients for Virtual Visits (Telemedicine).  Patients are able to view lab/test results, encounter notes, upcoming appointments, etc.  Non-urgent messages can be sent to your provider as well.   To learn more about what you can do with MyChart, go to [email or web address].    Your next appointment:    Follow up as needed

## 2020-06-10 ENCOUNTER — Other Ambulatory Visit: Payer: 59

## 2020-07-01 ENCOUNTER — Ambulatory Visit
Admission: RE | Admit: 2020-07-01 | Discharge: 2020-07-01 | Disposition: A | Discharge status: Home / Self Care | Payer: 59 | Source: Ambulatory Visit | Attending: Obstetrics and Gynecology | Admitting: Obstetrics and Gynecology

## 2020-07-01 ENCOUNTER — Other Ambulatory Visit: Payer: Self-pay

## 2020-07-01 DIAGNOSIS — Z1231 Encounter for screening mammogram for malignant neoplasm of breast: Secondary | ICD-10-CM | POA: Diagnosis not present

## 2020-10-10 IMAGING — MG DIGITAL SCREENING BILATERAL MAMMOGRAM WITH CAD
4 series · 4 of 4 positions shown · non-contrast
Comparison: Previous exam(s).

CLINICAL DATA: Screening.

EXAM:
DIGITAL SCREENING BILATERAL MAMMOGRAM WITH CAD

[R MLO]
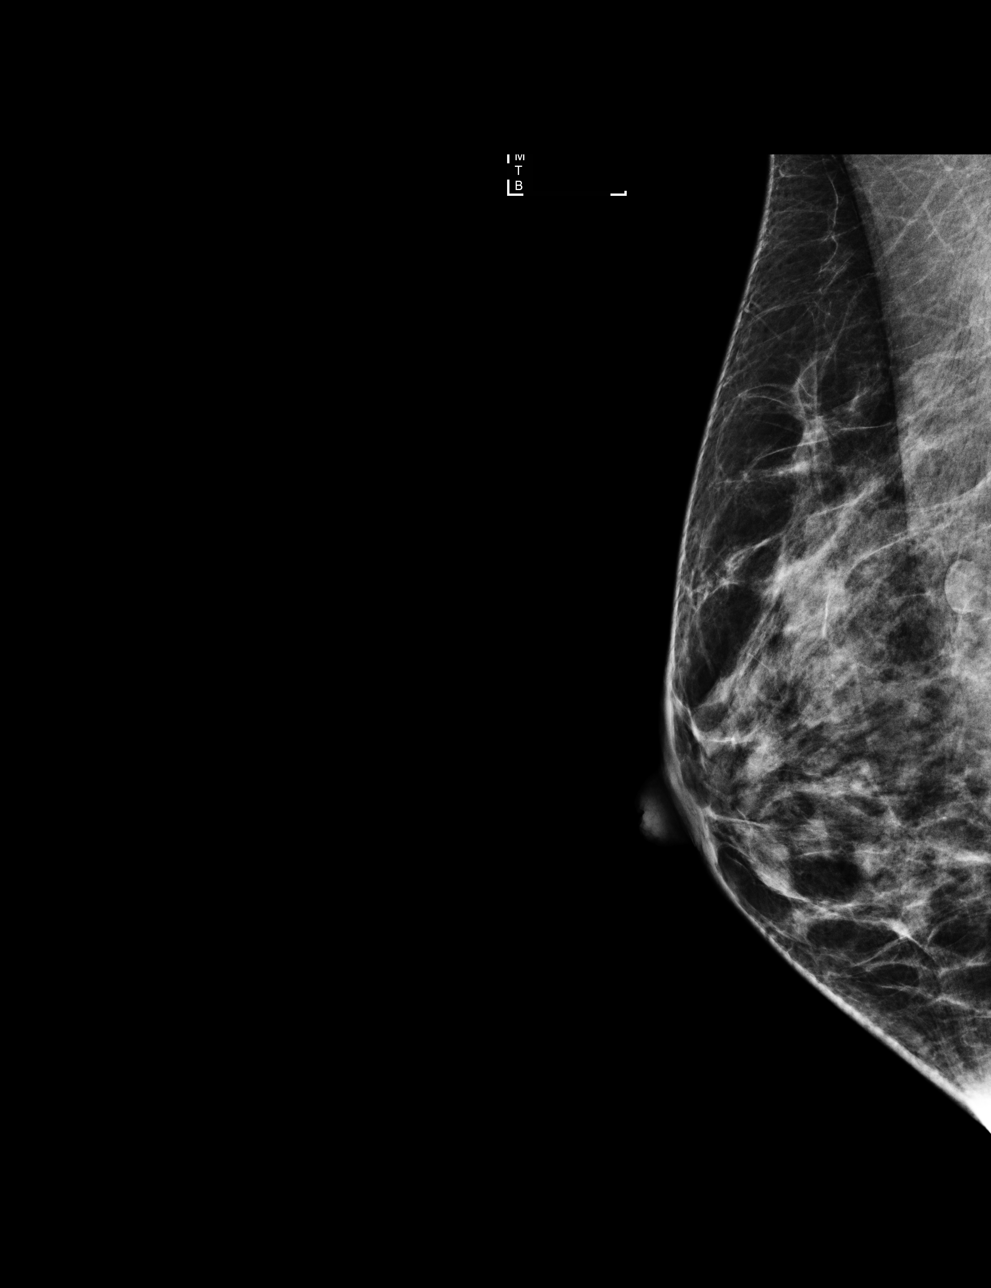

[L CC]
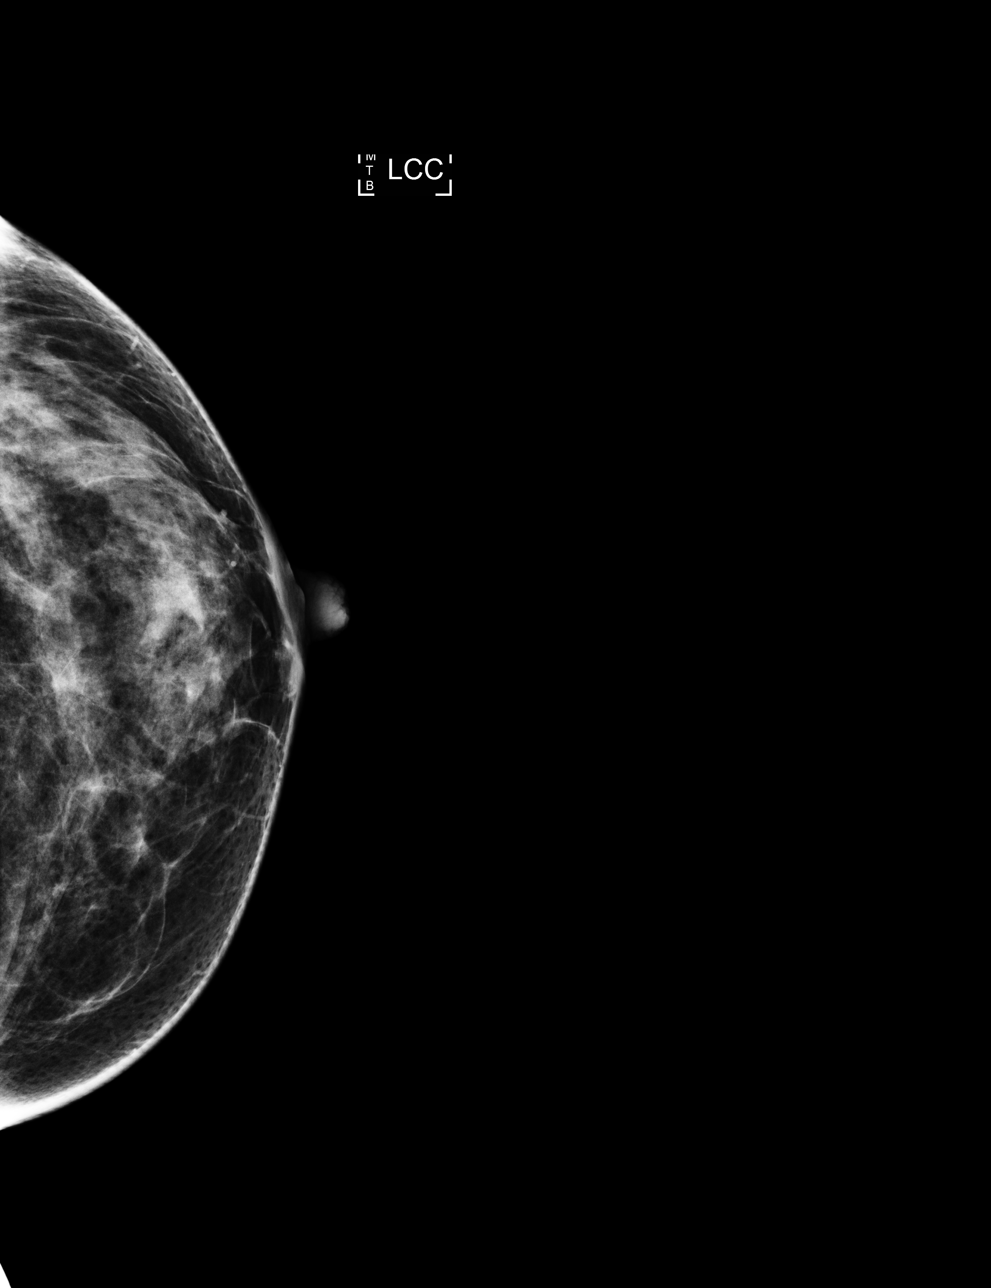

[R CC]
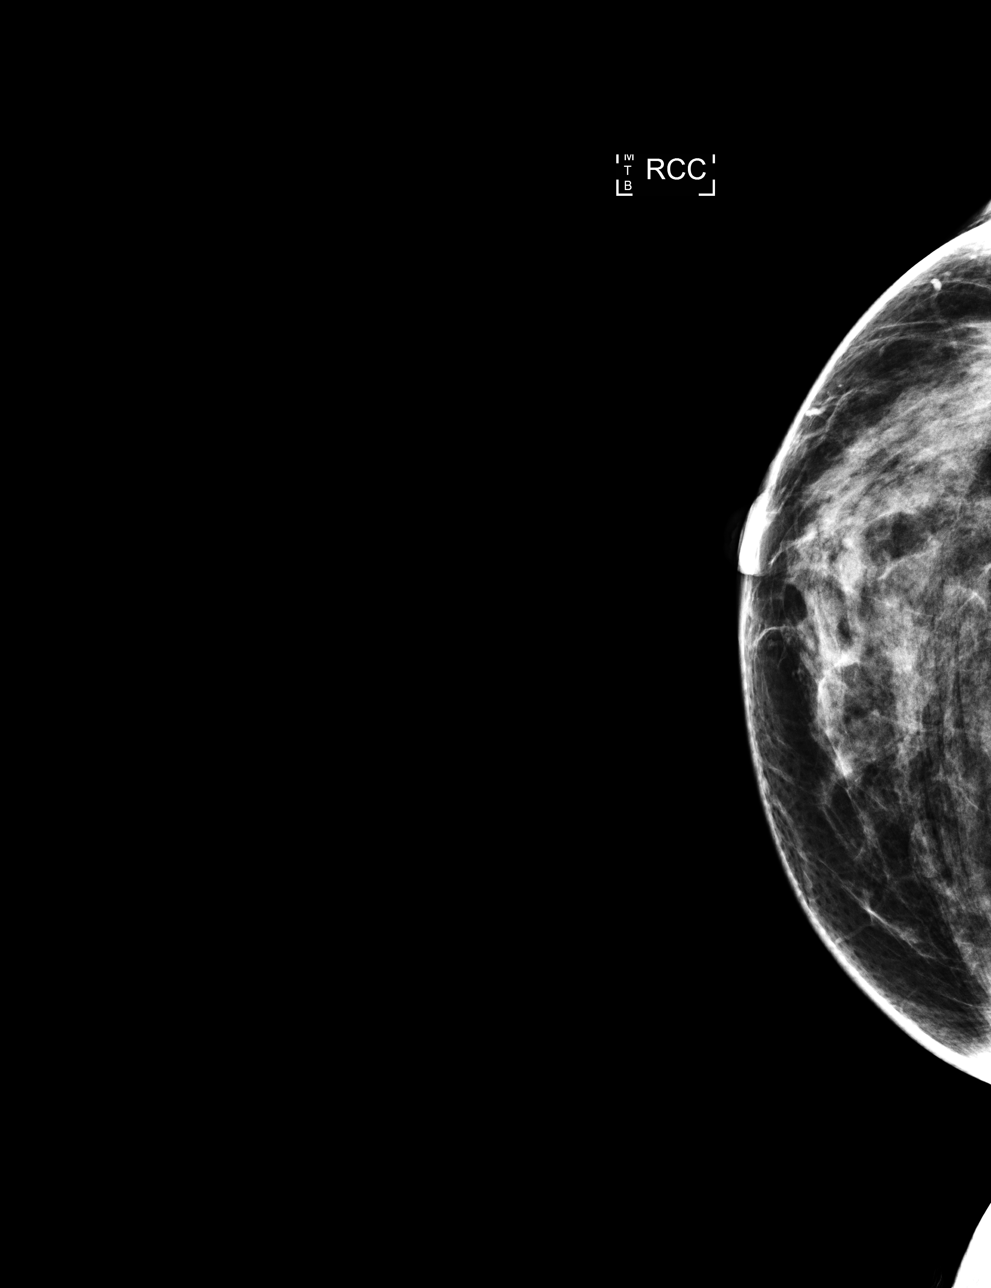

[L MLO]
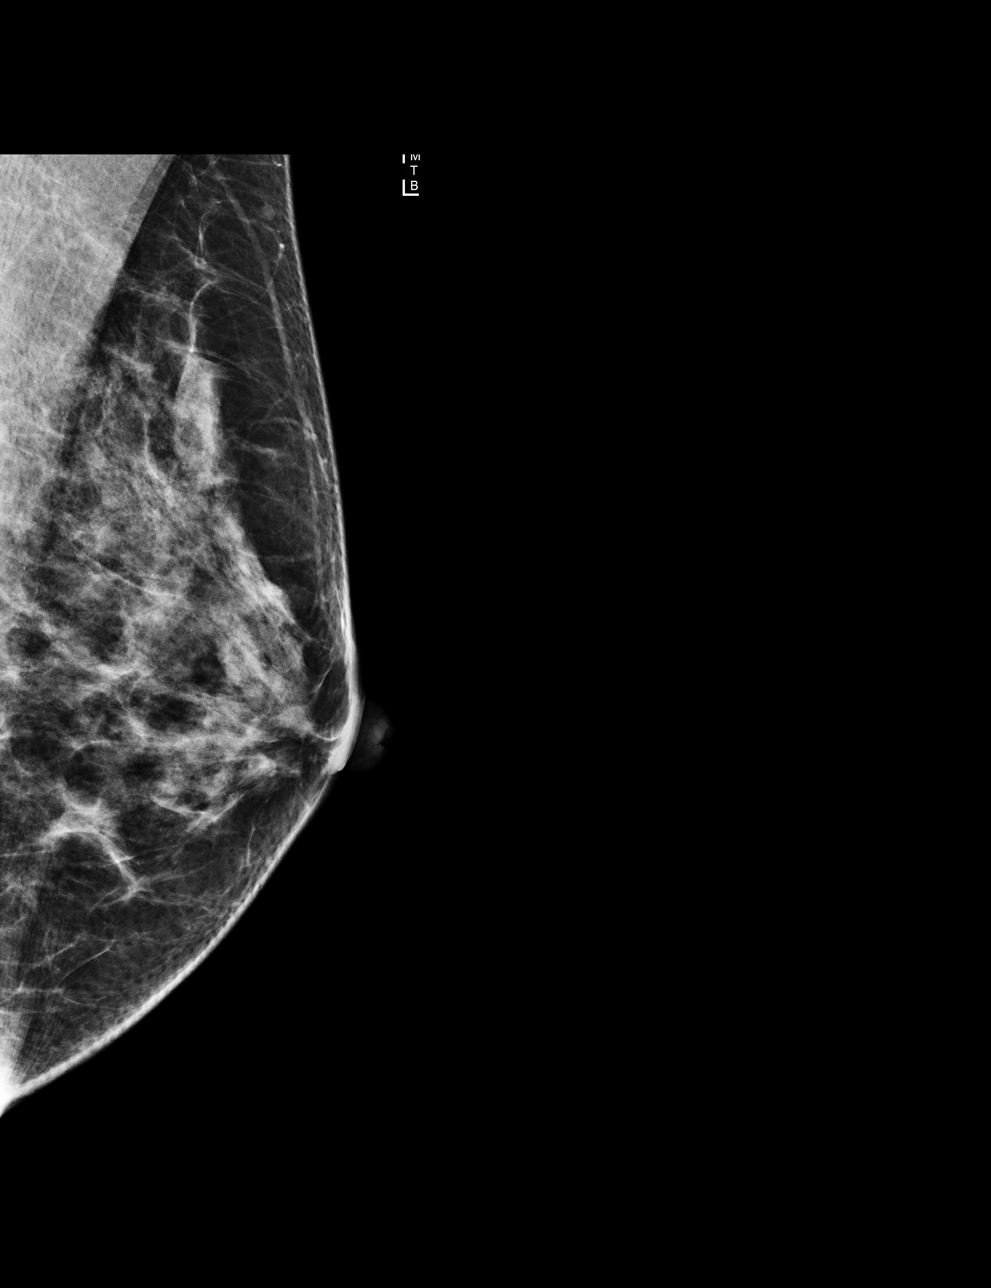

[4 of 4 positions shown; findings below may reference images not displayed]

ACR Breast Density Category c: The breast tissue is heterogeneously
dense, which may obscure small masses.
FINDINGS: In the right breast an asymmetry requires further evaluation.

In the left breast an asymmetry requires further evaluation.

Images were processed with CAD.
IMPRESSION: Further evaluation is suggested for possible asymmetry in the right
breast.

Further evaluation is suggested for possible asymmetry in the left
breast.

RECOMMENDATION:
Diagnostic mammogram and possibly ultrasound of both breasts.
(Code:EA-S-TTH)

The patient will be contacted regarding the findings, and additional
imaging will be scheduled.

BI-RADS CATEGORY  0: Incomplete. Need additional imaging evaluation
and/or prior mammograms for comparison.

## 2020-10-31 ENCOUNTER — Other Ambulatory Visit: Payer: Self-pay

## 2020-10-31 MED FILL — Bupropion HCl Tab ER 12HR 150 MG: ORAL | 60 days supply | Qty: 120 | Fill #0 | Status: AC

## 2020-11-04 IMAGING — MG STEREOTACTIC VACUUM ASSIST RIGHT
8 of 12 series · 8 of 12 positions shown · non-contrast
Comparison: Previous exams.

Addendum:
CLINICAL DATA: Right breast distortion.

EXAM:
RIGHT BREAST STEREOTACTIC CORE NEEDLE BIOPSY

[R (1 of 8)]
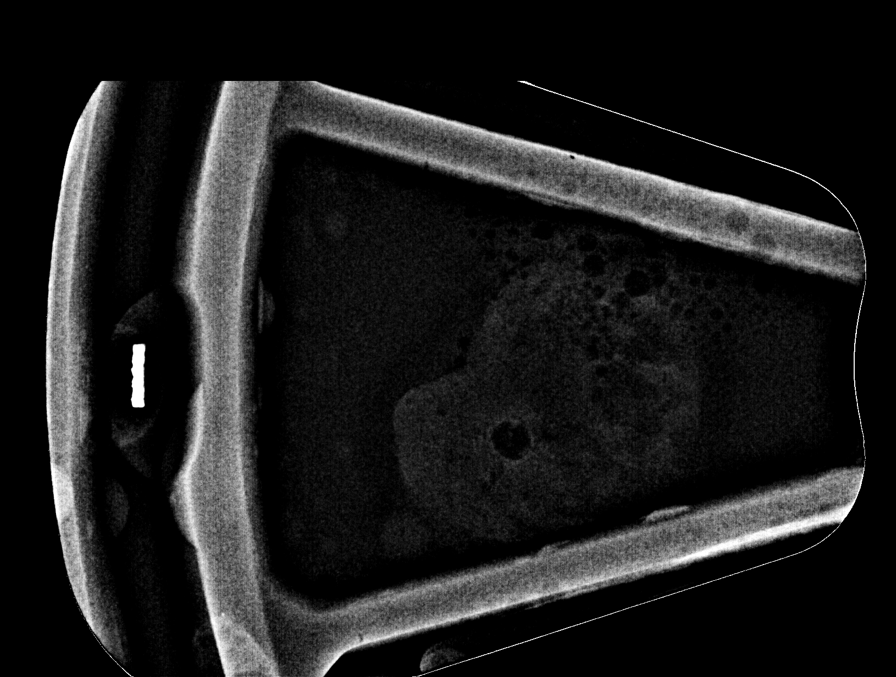

[R (2 of 8)]
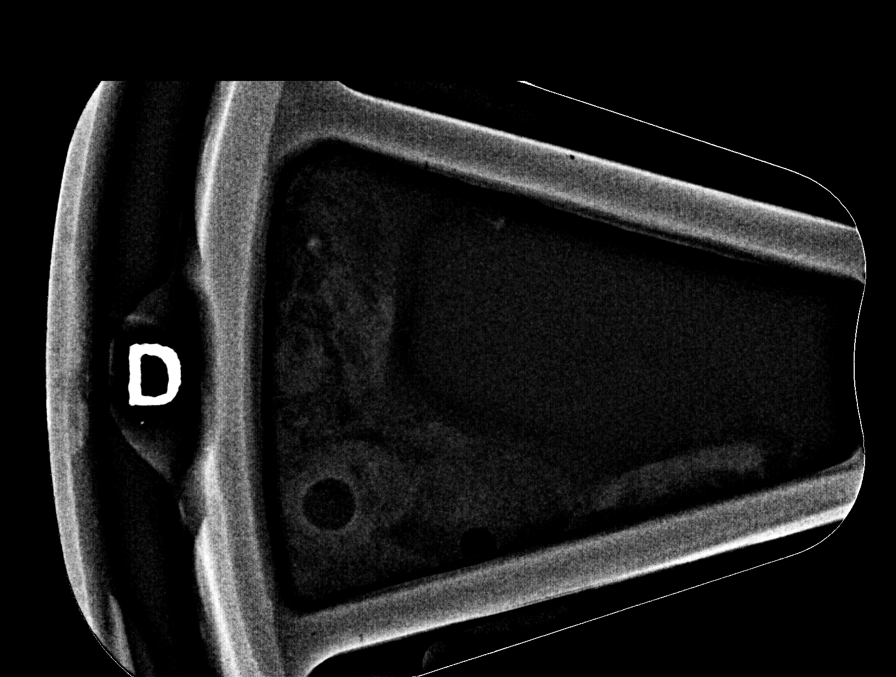

[R (3 of 8)]
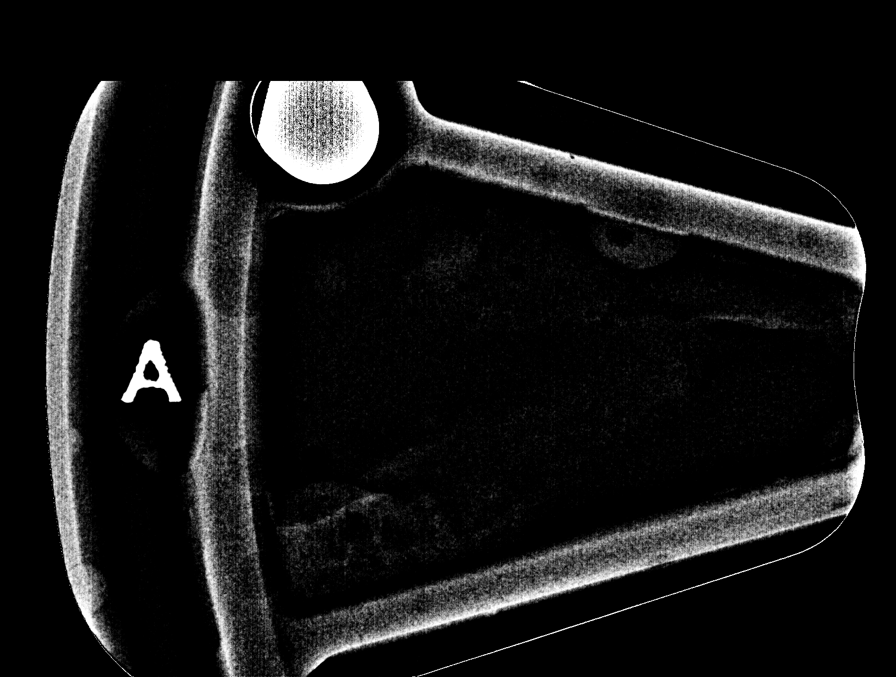

[R (4 of 8)]
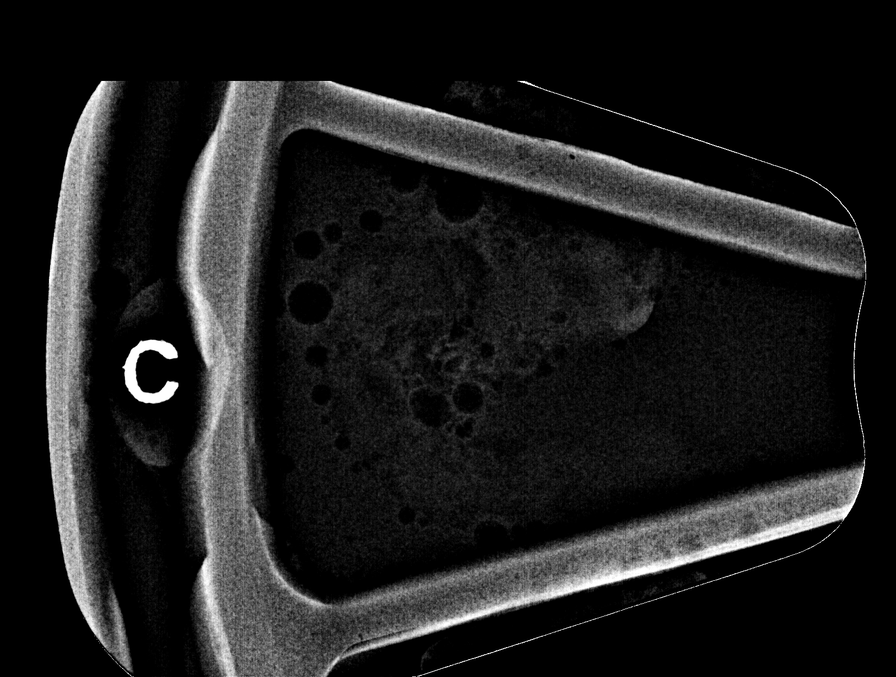

[R (5 of 8)]
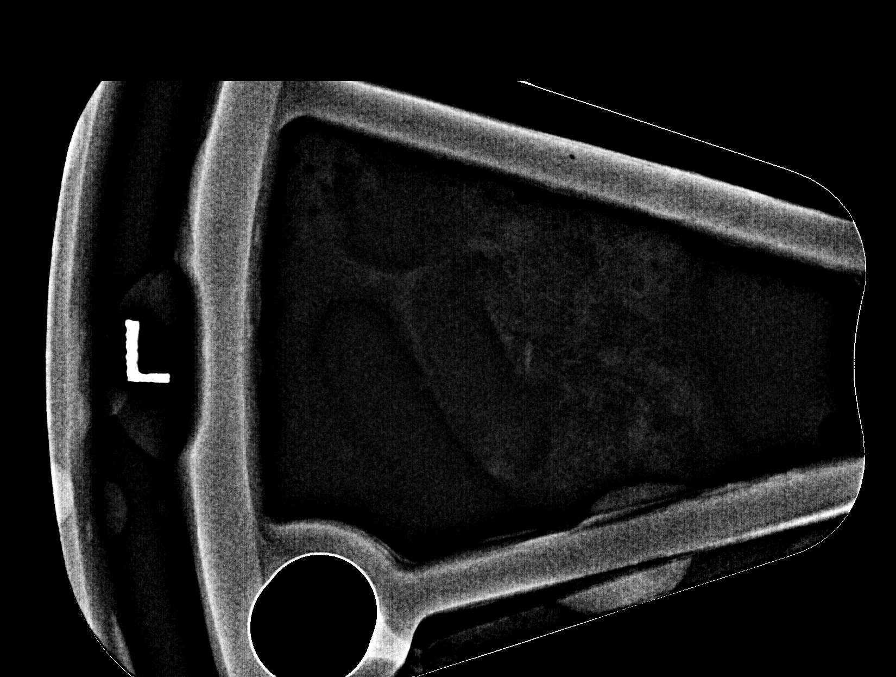

[R (6 of 8)]
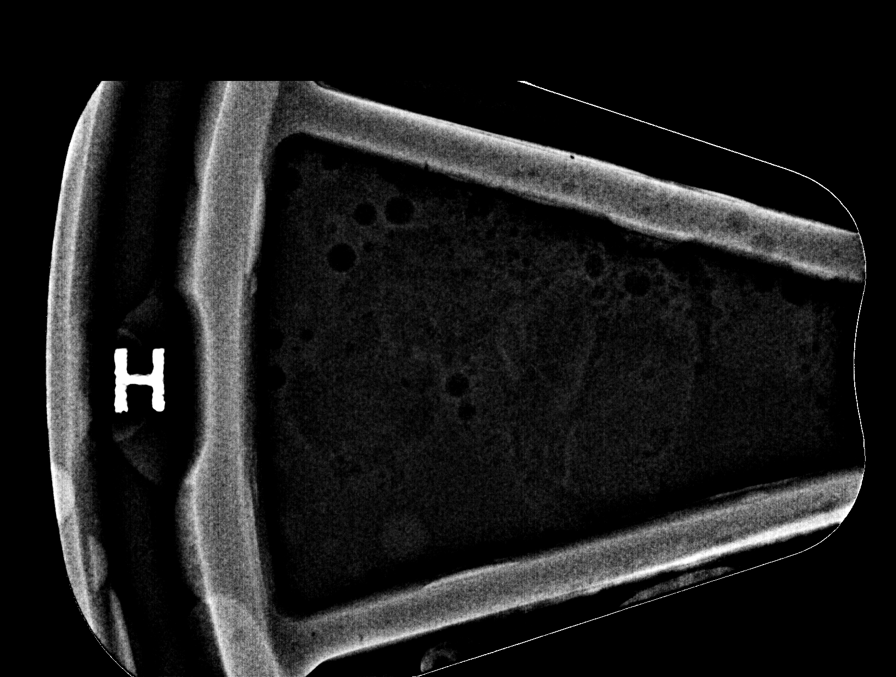

[R (7 of 8)]
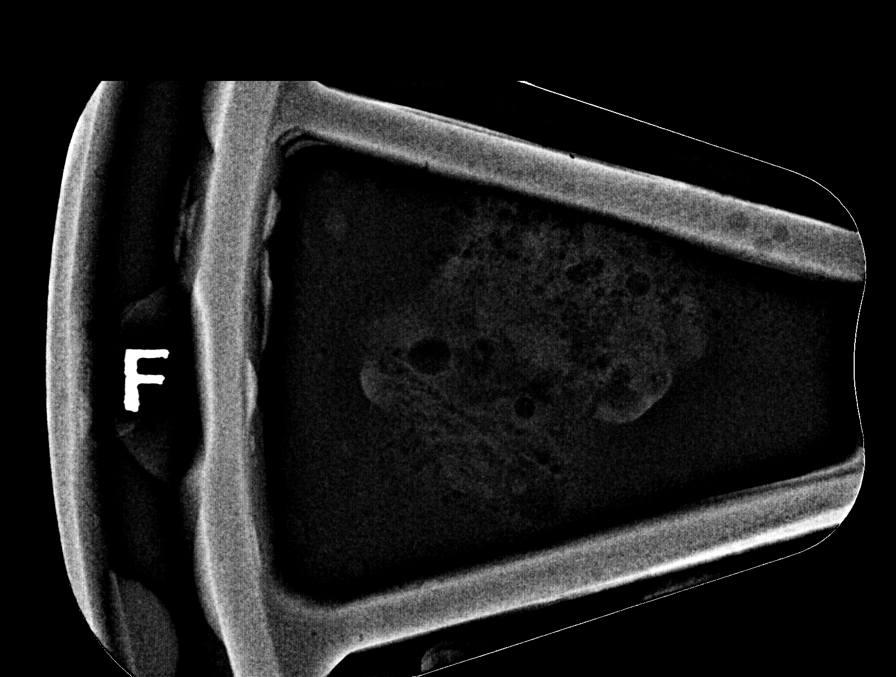

[R (8 of 8)]
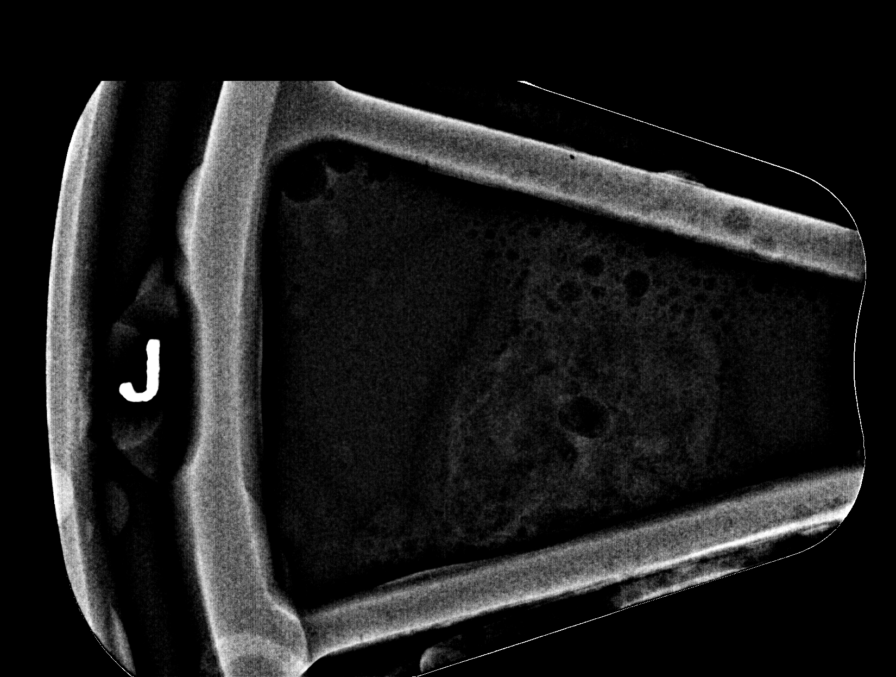

[8 of 12 positions shown; findings below may reference images not displayed]



Using sterile technique and 1% Lidocaine as local anesthetic, under
stereotactic guidance, a 9 gauge vacuum assisted device was used to
perform core needle biopsy of distortion in the slightly lower inner
quadrant of the right breast using a superior approach. Specimen
radiograph was performed showing presence of tissue.

Lesion quadrant: Lower inner quadrant

At the conclusion of the procedure, a X shaped tissue marker clip
was deployed into the biopsy cavity. Follow-up 2-view mammogram was
performed and dictated separately.
IMPRESSION: Stereotactic-guided biopsy of right breast. No apparent
complications.

ADDENDUM:
Pathology of the right breast biopsy revealed

- BENIGN MAMMARY TISSUE WITH STROMAL FIBROSIS AND FIBROCYSTIC
CHANGES.
- FOCAL SCLEROSING ADENOSIS WITH ASSOCIATED CALCIFICATIONS.
- NEGATIVE FOR ATYPICAL PROLIFERATIVE BREAST DISEASE.

This was found to be discordant by Dr. Mroz.

Recommendation: Surgical referral for excisional biospy.

At the patient's request, results and recommendations were relayed
to the Ms. Lulu by phone by Lusine Jim on 04/25/2018 at [DATE]. The patient stated she did well following the biopsy with
minimal pain and discomfort. Post biopsy instructions were reviewed
with the patient and all of her questions were answered. The patient
was encouraged to call [facility]HOSP[facility] with any further
questions or concerns.

Obj Mon, [place], RT R M, B.S.

*** End of Addendum ***

## 2020-11-27 ENCOUNTER — Other Ambulatory Visit: Payer: Self-pay

## 2020-11-27 MED ORDER — TRIAMCINOLONE ACETONIDE 0.1 % MT PSTE
PASTE | OROMUCOSAL | 0 refills | Status: DC
  Filled 2020-11-27: qty 5, 30d supply, fill #0

## 2021-01-20 DIAGNOSIS — H52223 Regular astigmatism, bilateral: Secondary | ICD-10-CM | POA: Diagnosis not present

## 2021-01-23 ENCOUNTER — Encounter: Payer: Self-pay | Admitting: Urology

## 2021-01-23 ENCOUNTER — Other Ambulatory Visit: Payer: Self-pay

## 2021-01-23 ENCOUNTER — Ambulatory Visit (INDEPENDENT_AMBULATORY_CARE_PROVIDER_SITE_OTHER): Payer: 59 | Admitting: Urology

## 2021-01-23 VITALS — BP 120/62 | HR 78 | Ht 66.0 in | Wt 144.0 lb

## 2021-01-23 DIAGNOSIS — N390 Urinary tract infection, site not specified: Secondary | ICD-10-CM | POA: Diagnosis not present

## 2021-01-23 LAB — MICROSCOPIC EXAMINATION: RBC, Urine: NONE SEEN /hpf (ref 0–2)

## 2021-01-23 LAB — URINALYSIS, COMPLETE
Bilirubin, UA: NEGATIVE
Glucose, UA: NEGATIVE
Ketones, UA: NEGATIVE
Nitrite, UA: NEGATIVE
Protein,UA: NEGATIVE
RBC, UA: NEGATIVE
Specific Gravity, UA: 1.025 (ref 1.005–1.030)
Urobilinogen, Ur: 0.2 mg/dL (ref 0.2–1.0)
pH, UA: 5.5 (ref 5.0–7.5)

## 2021-01-23 MED ORDER — SULFAMETHOXAZOLE-TRIMETHOPRIM 800-160 MG PO TABS
1.0000 | ORAL_TABLET | Freq: Two times a day (BID) | ORAL | 0 refills | Status: DC
  Filled 2021-01-23: qty 14, 7d supply, fill #0

## 2021-01-23 NOTE — Progress Notes (Signed)
01/23/2021 4:14 PM   Stephanie Chambers 15-Mar-1976 [id number]  Referring provider: No referring provider defined for this encounter.  Chief Complaint  Patient presents with   Recurrent UTI   Urological histoy: 1. rUTI's -contributing factors of age and sexually active -documented positive urine cultures over the last year  None  2. Interstitial cystitis -treated by Dr. Davis Gourd with cysto/hydrodistention     HPI: Stephanie Chambers is a 45 y.o. female who presents today for UTI.  She has been experiencing frequency, urgency, dysuria, postvoid dribbling and lower abdominal pain on and off for the last several weeks.  She states having intercourse makes the pain worse.  Patient denies any modifying or aggravating factors.  Patient denies any gross hematuria or flank pain.  Patient denies any fevers, chills, nausea or vomiting.    She had been self-medicating with Biaxin from a previous infection.  UA few bacteria.   PMH: Past Medical History:  Diagnosis Date   Anemia    Anxiety    Bulging of cervical intervertebral disc    Constipation    Depression    major   DUB (dysfunctional uterine bleeding)    Endometrial polyp 05/2012   hysteroscopy w/ d &C   Epigastric abdominal pain    Lump in female breast    Mastodynia    Menorrhagia    PONV (postoperative nausea and vomiting)    UTI (lower urinary tract infection)     Surgical History: Past Surgical History:  Procedure Laterality Date   BREAST BIOPSY Right 04/24/2018   Affirm Bx- X-clip,  FOCAL SCLEROSING ADENOSIS WITH ASSOCIATED CALCIFICATIONS   BREAST EXCISIONAL BIOPSY Right 10/11/2018   MILD FIBROCYSTIC CHANGES   DIAGNOSTIC LAPAROSCOPY  2004   DILATION AND CURETTAGE OF UTERUS     HYSTEROSCOPY     NEVUS EXCISION Left 10/12/2018   Procedure: NEVUS EXCISION X TWO NECK AND CHEST;  Surgeon: Rolm Bookbinder, MD;  Location: [place];  Service: General;  Laterality: Left;   RADIOACTIVE SEED  GUIDED EXCISIONAL BREAST BIOPSY Right 10/12/2018   Procedure: RADIOACTIVE SEED GUIDED EXCISIONAL RIGHT BREAST BIOPSY AND REMOVAL OF MOLE RIGHT BREAST;  Surgeon: Rolm Bookbinder, MD;  Location: [place];  Service: General;  Laterality: Right;   TONSILLECTOMY  1998    Home Medications:  Allergies as of 01/23/2021       Reactions   Cefdinir Shortness Of Breath   Almond Meal Itching   Cherry Itching   Peach [prunus Persica] Itching        Medication List        Accurate as of January 23, 2021 11:59 PM. If you have any questions, ask your nurse or doctor.          STOP taking these medications    triamcinolone 0.1 % paste Commonly known as: KENALOG Stopped by: Palmina Clodfelter, PA-C       TAKE these medications    buPROPion 150 MG 12 hr tablet Commonly known as: WELLBUTRIN SR TAKE 1 TABLET BY MOUTH TWO TIMES DAILY   loratadine 10 MG tablet Commonly known as: CLARITIN Take 10 mg by mouth daily.   sulfamethoxazole-trimethoprim 800-160 MG tablet Commonly known as: BACTRIM DS Take 1 tablet by mouth every 12 (twelve) hours. Started by: Zara Council, PA-C   vitamin E 180 MG (400 UNITS) capsule Take 800 Units by mouth daily.        Allergies:  Allergies  Allergen Reactions   Cefdinir Shortness Of Breath  Almond Meal Itching   Cherry Itching   Peach [Prunus Persica] Itching    Family History: Family History  Problem Relation Age of Onset   Cancer Mother 62       breast with liver mets   Breast cancer Mother 54   Cancer Father 87       colon, lung, kidney   Heart disease Father    Colon cancer Father    Depression Maternal Grandmother    Cancer Maternal Grandmother        colon   Colon cancer Maternal Grandmother    Depression Paternal Grandmother    Heart disease Paternal Grandfather    Breast cancer Cousin 83   Hypertension Brother    Ovarian cancer Neg Hx     Social History:  reports that she has quit smoking. She has  never used smokeless tobacco. She reports current alcohol use. She reports that she does not use drugs.  ROS For pertinent review of systems please refer to history of present illness   Physical Exam: BP 120/62   Pulse 78   Ht 5\' 6"  (1.676 m)   Wt 144 lb (65.3 kg)   BMI 23.24 kg/m   Constitutional:  Well nourished. Alert and oriented, No acute distress. HEENT: [place] AT, mask in place.  Trachea midline Cardiovascular: No clubbing, cyanosis, or edema. Respiratory: Normal respiratory effort, no increased work of breathing. Neurologic: Grossly intact, no focal deficits, moving all 4 extremities. Psychiatric: Normal mood and affect.   Laboratory Data: Lab Results  Component Value Date   WBC 4.3 06/03/2020   HGB 13.7 06/03/2020   HCT 40.9 06/03/2020   MCV 96 06/03/2020   PLT 206 06/03/2020   Lab Results  Component Value Date   HGBA1C 5.1 06/03/2020    Lab Results  Component Value Date   TSH 1.040 06/03/2020       Component Value Date/Time   CHOL 198 06/03/2020 1033   HDL 77 06/03/2020 1033   CHOLHDL 2.6 06/03/2020 1033   LDLCALC 110 (H) 06/03/2020 1033    Urinalysis Component     Latest Ref Rng & Units 01/30/2019  Specific Gravity, UA     1.005 - 1.030 1.020  pH, UA     5.0 - 7.5 6.0  Color, UA     Yellow Yellow  Appearance Ur     Clear Hazy (A)  Leukocytes,UA     Negative Negative  Protein,UA     Negative/Trace Negative  Glucose, UA     Negative Negative  Ketones, UA     Negative Negative  RBC, UA     Negative Trace (A)  Bilirubin, UA     Negative Negative  Urobilinogen, Ur     0.2 - 1.0 mg/dL 0.2  Nitrite, UA     Negative Negative  Microscopic Examination      See below:   Component     Latest Ref Rng & Units 01/30/2019  WBC, UA     0 - 5 /hpf 0-5  RBC     0 - 2 /hpf None seen  Epithelial Cells (non renal)     0 - 10 /hpf 0-10  Bacteria, UA     None seen/Few Moderate (A)  I have reviewed the labs.  Pertinent Imaging N/A  Assessment &  Plan:    1. rUTI's -UA unremarkable -Urine sent for culture as patient is symptomatic -Start on Septra DS, twice daily for 7 days-we will readjust once sensitivities are available  2. IC -managed conservatively                   Return for pending urine culture results .  These notes generated with voice recognition software. I apologize for typographical errors.  Zara Council, PA-C  [facility] [address]  [place], [place] [address] [phone]

## 2021-01-26 LAB — CULTURE, URINE COMPREHENSIVE

## 2021-01-27 ENCOUNTER — Other Ambulatory Visit: Payer: Self-pay

## 2021-01-27 ENCOUNTER — Other Ambulatory Visit: Payer: Self-pay | Admitting: Urology

## 2021-01-27 DIAGNOSIS — N390 Urinary tract infection, site not specified: Secondary | ICD-10-CM

## 2021-01-27 MED ORDER — CIPROFLOXACIN HCL 500 MG PO TABS
500.0000 mg | ORAL_TABLET | Freq: Two times a day (BID) | ORAL | 0 refills | Status: DC
Start: 2021-01-27 — End: 2021-02-20
  Filled 2021-01-27: qty 14, 7d supply, fill #0

## 2021-02-06 ENCOUNTER — Other Ambulatory Visit: Payer: Self-pay

## 2021-02-06 ENCOUNTER — Other Ambulatory Visit: Payer: 59

## 2021-02-06 DIAGNOSIS — N390 Urinary tract infection, site not specified: Secondary | ICD-10-CM

## 2021-02-06 LAB — URINALYSIS, COMPLETE
Bilirubin, UA: NEGATIVE
Glucose, UA: NEGATIVE
Ketones, UA: NEGATIVE
Leukocytes,UA: NEGATIVE
Nitrite, UA: NEGATIVE
Protein,UA: NEGATIVE
Specific Gravity, UA: 1.015 (ref 1.005–1.030)
Urobilinogen, Ur: 0.2 mg/dL (ref 0.2–1.0)
pH, UA: 5.5 (ref 5.0–7.5)

## 2021-02-06 LAB — MICROSCOPIC EXAMINATION: Epithelial Cells (non renal): >10 /hpf — AB (ref 0–10)

## 2021-02-12 ENCOUNTER — Other Ambulatory Visit: Payer: Self-pay | Admitting: Urology

## 2021-02-12 ENCOUNTER — Other Ambulatory Visit: Payer: Self-pay

## 2021-02-12 DIAGNOSIS — N390 Urinary tract infection, site not specified: Secondary | ICD-10-CM

## 2021-02-12 LAB — CULTURE, URINE COMPREHENSIVE

## 2021-02-12 MED ORDER — NITROFURANTOIN MONOHYD MACRO 100 MG PO CAPS
100.0000 mg | ORAL_CAPSULE | Freq: Two times a day (BID) | ORAL | 0 refills | Status: DC
  Filled 2021-02-12: qty 14, 7d supply, fill #0

## 2021-02-20 ENCOUNTER — Other Ambulatory Visit: Payer: Self-pay

## 2021-02-20 ENCOUNTER — Other Ambulatory Visit: Payer: Self-pay | Admitting: Urology

## 2021-02-20 DIAGNOSIS — N390 Urinary tract infection, site not specified: Secondary | ICD-10-CM

## 2021-02-20 MED ORDER — SULFAMETHOXAZOLE-TRIMETHOPRIM 800-160 MG PO TABS
1.0000 | ORAL_TABLET | Freq: Two times a day (BID) | ORAL | 0 refills | Status: DC
Start: 2021-02-20 — End: 2021-05-14
  Filled 2021-02-20: qty 14, 7d supply, fill #0

## 2021-02-24 ENCOUNTER — Other Ambulatory Visit: Payer: 59

## 2021-02-26 ENCOUNTER — Other Ambulatory Visit: Payer: Self-pay

## 2021-02-26 ENCOUNTER — Other Ambulatory Visit: Payer: 59

## 2021-02-26 DIAGNOSIS — N390 Urinary tract infection, site not specified: Secondary | ICD-10-CM

## 2021-02-26 LAB — URINALYSIS, COMPLETE
Bilirubin, UA: NEGATIVE
Glucose, UA: NEGATIVE
Ketones, UA: NEGATIVE
Nitrite, UA: NEGATIVE
Protein,UA: NEGATIVE
RBC, UA: NEGATIVE
Specific Gravity, UA: 1.025 (ref 1.005–1.030)
Urobilinogen, Ur: 0.2 mg/dL (ref 0.2–1.0)
pH, UA: 5 (ref 5.0–7.5)

## 2021-02-26 LAB — MICROSCOPIC EXAMINATION: Epithelial Cells (non renal): >10 /hpf — AB (ref 0–10)

## 2021-03-01 LAB — CULTURE, URINE COMPREHENSIVE

## 2021-03-02 ENCOUNTER — Other Ambulatory Visit: Payer: Self-pay

## 2021-03-02 ENCOUNTER — Ambulatory Visit
Admission: RE | Admit: 2021-03-02 | Discharge: 2021-03-02 | Disposition: A | Discharge status: Home / Self Care | Payer: 59 | Source: Ambulatory Visit | Attending: Urology | Admitting: Urology

## 2021-03-02 DIAGNOSIS — N390 Urinary tract infection, site not specified: Secondary | ICD-10-CM | POA: Diagnosis not present

## 2021-05-08 ENCOUNTER — Telehealth: Payer: No Typology Code available for payment source | Admitting: Emergency Medicine

## 2021-05-08 ENCOUNTER — Other Ambulatory Visit: Payer: Self-pay

## 2021-05-08 DIAGNOSIS — J329 Chronic sinusitis, unspecified: Secondary | ICD-10-CM | POA: Diagnosis not present

## 2021-05-08 MED ORDER — IPRATROPIUM BROMIDE 0.03 % NA SOLN
2.0000 | Freq: Two times a day (BID) | NASAL | 0 refills | Status: DC
  Filled 2021-05-08: qty 30, 30d supply, fill #0

## 2021-05-08 NOTE — Progress Notes (Signed)
E-Visit for Sinus Problems  We are sorry that you are not feeling well.  Here is how we plan to help!  Based on what you have shared with me it looks like you have sinusitis.  Sinusitis is inflammation and infection in the sinus cavities of the head.  Based on your presentation I believe you most likely have Acute Viral Sinusitis.This is an infection most likely caused by a virus. There is not specific treatment for viral sinusitis other than to help you with the symptoms until the infection runs its course.  You may use an oral decongestant such as Mucinex D or if you have glaucoma or high blood pressure use plain Mucinex. Saline nasal spray help and can safely be used as often as needed for congestion, I have prescribed: Ipratropium Bromide nasal spray 0.03% 2 sprays in eah nostril 2-3 times a day  Some authorities believe that zinc sprays or the use of Echinacea may shorten the course of your symptoms.  Sinus infections are not as easily transmitted as other respiratory infection, however we still recommend that you avoid close contact with loved ones, especially the very young and elderly.  Remember to wash your hands thoroughly throughout the day as this is the number one way to prevent the spread of infection!  Home Care: Only take medications as instructed by your medical team. Do not take these medications with alcohol. A steam or ultrasonic humidifier can help congestion.  You can place a towel over your head and breathe in the steam from hot water coming from a faucet. Avoid close contacts especially the very young and the elderly. Cover your mouth when you cough or sneeze. Always remember to wash your hands.  Get Help Right Away If: You develop worsening fever or sinus pain. You develop a severe head ache or visual changes. Your symptoms persist after you have completed your treatment plan.  Make sure you Understand these instructions. Will watch your condition. Will get help  right away if you are not doing well or get worse.   Thank you for choosing an e-visit.  Your e-visit answers were reviewed by a board certified advanced clinical practitioner to complete your personal care plan. Depending upon the condition, your plan could have included both over the counter or prescription medications.  Please review your pharmacy choice. Make sure the pharmacy is open so you can pick up prescription now. If there is a problem, you may contact your provider through MyChart messaging and have the prescription routed to another pharmacy.  Your safety is important to us. If you have drug allergies check your prescription carefully.   For the next 24 hours you can use MyChart to ask questions about today's visit, request a non-urgent call back, or ask for a work or school excuse. You will get an email in the next two days asking about your experience. I hope that your e-visit has been valuable and will speed your recovery.  Approximately 5 minutes was used in reviewing the patient's chart, questionnaire, prescribing medications, and documentation.  

## 2021-05-14 ENCOUNTER — Other Ambulatory Visit: Payer: Self-pay

## 2021-05-14 ENCOUNTER — Encounter: Payer: Self-pay | Admitting: Adult Health

## 2021-05-14 ENCOUNTER — Ambulatory Visit (INDEPENDENT_AMBULATORY_CARE_PROVIDER_SITE_OTHER): Payer: No Typology Code available for payment source | Admitting: Adult Health

## 2021-05-14 VITALS — BP 126/76 | HR 75 | Temp 98.0°F | Resp 14 | Ht 66.5 in | Wt 144.0 lb

## 2021-05-14 DIAGNOSIS — Z803 Family history of malignant neoplasm of breast: Secondary | ICD-10-CM | POA: Diagnosis not present

## 2021-05-14 DIAGNOSIS — Z800 Family history of malignant neoplasm of digestive organs: Secondary | ICD-10-CM

## 2021-05-14 DIAGNOSIS — F419 Anxiety disorder, unspecified: Secondary | ICD-10-CM

## 2021-05-14 DIAGNOSIS — N943 Premenstrual tension syndrome: Secondary | ICD-10-CM

## 2021-05-14 DIAGNOSIS — Z0000 Encounter for general adult medical examination without abnormal findings: Secondary | ICD-10-CM | POA: Insufficient documentation

## 2021-05-14 DIAGNOSIS — F32A Depression, unspecified: Secondary | ICD-10-CM

## 2021-05-14 DIAGNOSIS — H6501 Acute serous otitis media, right ear: Secondary | ICD-10-CM | POA: Insufficient documentation

## 2021-05-14 DIAGNOSIS — J0140 Acute pansinusitis, unspecified: Secondary | ICD-10-CM | POA: Insufficient documentation

## 2021-05-14 DIAGNOSIS — Z1231 Encounter for screening mammogram for malignant neoplasm of breast: Secondary | ICD-10-CM

## 2021-05-14 MED ORDER — ALPRAZOLAM 0.25 MG PO TABS
0.2500 mg | ORAL_TABLET | Freq: Three times a day (TID) | ORAL | 0 refills | Status: DC | PRN
  Filled 2021-05-14: qty 30, 10d supply, fill #0

## 2021-05-14 MED ORDER — BUPROPION HCL ER (SR) 150 MG PO TB12
ORAL_TABLET | Freq: Two times a day (BID) | ORAL | 6 refills | Status: DC
  Filled 2021-05-14 (×2): qty 120, 60d supply, fill #0
  Filled 2021-09-07: qty 120, 60d supply, fill #1
  Filled 2021-11-12: qty 120, 60d supply, fill #2
  Filled 2022-03-18: qty 120, 60d supply, fill #3

## 2021-05-14 MED ORDER — AMOXICILLIN-POT CLAVULANATE 875-125 MG PO TABS
1.0000 | ORAL_TABLET | Freq: Two times a day (BID) | ORAL | 0 refills | Status: DC
  Filled 2021-05-14: qty 20, 10d supply, fill #0

## 2021-05-14 NOTE — Patient Instructions (Addendum)
March 15th or after schedule mammogram.  Call to schedule your screening mammogram. Your orders have been placed for your exam.  Let our office know if you have questions, concerns, or any difficulty scheduling.  If normal results then yearly screening mammograms are recommended unless you notice  Changes in your breast then you should schedule a follow up office visit. If abnormal results  Further imaging will be warranted and sooner follow up as determined by the radiologist at the [facility].   [facility] [place], [place] [address]  Main: [phone]   Can increase Wellbutrin to 150 mg by mouth one tablet in the morning and one in the evening.    Health Maintenance, Female Adopting a healthy lifestyle and getting preventive care are important in promoting health and wellness. Ask your health care provider about: The right schedule for you to have regular tests and exams. Things you can do on your own to prevent diseases and keep yourself healthy. What should I know about diet, weight, and exercise? Eat a healthy diet  Eat a diet that includes plenty of vegetables, fruits, low-fat dairy products, and lean protein. Do not eat a lot of foods that are high in solid fats, added sugars, or sodium. Maintain a healthy weight Body mass index (BMI) is used to identify weight problems. It estimates body fat based on height and weight. Your health care provider can help determine your BMI and help you achieve or maintain a healthy weight. Get regular exercise Get regular exercise. This is one of the most important things you can do for your health. Most adults should: Exercise for at least 150 minutes each week. The exercise should increase your heart rate and make you sweat (moderate-intensity exercise). Do strengthening exercises at least twice a week. This is in addition to the moderate-intensity exercise. Spend less time sitting. Even  light physical activity can be beneficial. Watch cholesterol and blood lipids Have your blood tested for lipids and cholesterol at 46 years of age, then have this test every 5 years. Have your cholesterol levels checked more often if: Your lipid or cholesterol levels are high. You are older than 46 years of age. You are at high risk for heart disease. What should I know about cancer screening? Depending on your health history and family history, you may need to have cancer screening at various ages. This may include screening for: Breast cancer. Cervical cancer. Colorectal cancer. Skin cancer. Lung cancer. What should I know about heart disease, diabetes, and high blood pressure? Blood pressure and heart disease High blood pressure causes heart disease and increases the risk of stroke. This is more likely to develop in people who have high blood pressure readings or are overweight. Have your blood pressure checked: Every 3-5 years if you are 36-13 years of age. Every year if you are 74 years old or older. Diabetes Have regular diabetes screenings. This checks your fasting blood sugar level. Have the screening done: Once every three years after age 8 if you are at a normal weight and have a low risk for diabetes. More often and at a younger age if you are overweight or have a high risk for diabetes. What should I know about preventing infection? Hepatitis B If you have a higher risk for hepatitis B, you should be screened for this virus. Talk with your health care provider to find out if you are at risk for hepatitis B infection. Hepatitis  C Testing is recommended for: Everyone born from [birth year] through 1965. Anyone with known risk factors for hepatitis C. Sexually transmitted infections (STIs) Get screened for STIs, including gonorrhea and chlamydia, if: You are sexually active and are younger than 46 years of age. You are older than 46 years of age and your health care provider tells  you that you are at risk for this type of infection. Your sexual activity has changed since you were last screened, and you are at increased risk for chlamydia or gonorrhea. Ask your health care provider if you are at risk. Ask your health care provider about whether you are at high risk for HIV. Your health care provider may recommend a prescription medicine to help prevent HIV infection. If you choose to take medicine to prevent HIV, you should first get tested for HIV. You should then be tested every 3 months for as long as you are taking the medicine. Pregnancy If you are about to stop having your period (premenopausal) and you may become pregnant, seek counseling before you get pregnant. Take 400 to 800 micrograms (mcg) of folic acid every day if you become pregnant. Ask for birth control (contraception) if you want to prevent pregnancy. Osteoporosis and menopause Osteoporosis is a disease in which the bones lose minerals and strength with aging. This can result in bone fractures. If you are 43 years old or older, or if you are at risk for osteoporosis and fractures, ask your health care provider if you should: Be screened for bone loss. Take a calcium or vitamin D supplement to lower your risk of fractures. Be given hormone replacement therapy (HRT) to treat symptoms of menopause. Follow these instructions at home: Alcohol use Do not drink alcohol if: Your health care provider tells you not to drink. You are pregnant, may be pregnant, or are planning to become pregnant. If you drink alcohol: Limit how much you have to: 0-1 drink a day. Know how much alcohol is in your drink. In the [place] one drink equals one 12 oz bottle of beer (355 mL), one 5 oz glass of wine (148 mL), or one 1 oz glass of hard liquor (44 mL). Lifestyle Do not use any products that contain nicotine or tobacco. These products include cigarettes, chewing tobacco, and vaping devices, such as e-cigarettes. If you need help  quitting, ask your health care provider. Do not use street drugs. Do not share needles. Ask your health care provider for help if you need support or information about quitting drugs. General instructions Schedule regular health, dental, and eye exams. Stay current with your vaccines. Tell your health care provider if: You often feel depressed. You have ever been abused or do not feel safe at home. Summary Adopting a healthy lifestyle and getting preventive care are important in promoting health and wellness. Follow your health care provider's instructions about healthy diet, exercising, and getting tested or screened for diseases. Follow your health care provider's instructions on monitoring your cholesterol and blood pressure. This information is not intended to replace advice given to you by your health care provider. Make sure you discuss any questions you have with your health care provider. Document Revised: 08/25/2020 Document Reviewed: 08/25/2020 Elsevier Patient Education  [place]. Alprazolam Tablets What is this medication? ALPRAZOLAM (al PRAY zoe lam) treats anxiety. It works by Child psychotherapist system calm down. It belongs to a group of medications called benzodiazepines. This medicine may be used for other purposes; ask your health care  provider or pharmacist if you have questions. COMMON BRAND NAME(S): Xanax What should I tell my care team before I take this medication? They need to know if you have any of these conditions: Depression or other mental health disease History of alcohol or drug abuse or addiction Kidney disease Liver disease Lung disease, asthma, or breathing problem Seizures Suicidal thoughts, plans or attempt An unusual or allergic reaction to alprazolam, other benzodiazepines, foods, dyes, or preservatives Pregnant or trying to get pregnant Breast-feeding How should I use this medication? Take this medication by mouth. Take it as  directed on the prescription label. Do not take it more often than directed. Keep taking it unless your care team tells you to stop. A special MedGuide will be given to you by the pharmacist with each prescription and refill. Be sure to read this information carefully each time. Talk to your care team about the use of this medication in children. Special care may be needed. Patients over 79 years of age may have a stronger reaction and need a smaller dose. Overdosage: If you think you have taken too much of this medicine contact a poison control center or emergency room at once. NOTE: This medicine is only for you. Do not share this medicine with others. What if I miss a dose? If you miss a dose, take it as soon as you can. If it is almost time for your next dose, take only that dose. Do not take double or extra doses. What may interact with this medication? Do not take this medication with any of the following: Certain antivirals for HIV or hepatitis Certain medications for fungal infections like ketoconazole, itraconazole, or posaconazole Clarithromycin Grapefruit juice Narcotic medications for cough Sodium oxybate This medication may also interact with the following: Alcohol Antihistamines for allergy, cough and cold Certain medications for anxiety or sleep Certain medications for depression like amitriptyline, fluoxetine, fluvoxamine, nefazodone, sertraline Certain medications for seizures like carbamazepine, phenobarbital, phenytoin, primidone Cimetidine Digoxin Erythromycin Female hormones, like estrogens or progestins and birth control pills, patches, rings, or injections General anesthetics like halothane, isoflurane, methoxyflurane, propofol Medications that relax muscles Narcotic medications for pain Phenothiazines like chlorpromazine, mesoridazine, prochlorperazine, thioridazine This list may not describe all possible interactions. Give your health care provider a list of all  the medicines, herbs, non-prescription drugs, or dietary supplements you use. Also tell them if you smoke, drink alcohol, or use illegal drugs. Some items may interact with your medicine. What should I watch for while using this medication? Visit your care team for regular checks on your progress. Tell your care team if your symptoms do not start to get better or if they get worse. Do not stop taking except on your care team's advice. You may develop a severe reaction. Your care team will tell you how much medication to take. You may get drowsy or dizzy. Do not drive, use machinery, or do anything that needs mental alertness until you know how this medication affects you. To reduce the risk of dizzy and fainting spells, do not stand or sit up quickly, especially if you are an older patient. Alcohol may increase dizziness and drowsiness. Avoid alcoholic drinks. If you are taking another medication that also causes drowsiness, you may have more side effects. Give your care team a list of all medications you use. Your care team will tell you how much medication to take. Do not take more medication than directed. Call emergency services if you have problems breathing or unusual sleepiness.  Women should inform their care team if they wish to become pregnant or think they might be pregnant. Do not breast-feed while taking this medication. Talk to your care team for more information. What side effects may I notice from receiving this medication? Side effects that you should report to your care team as soon as possible: Allergic reactions--skin rash, itching, hives, swelling of the face, lips, tongue, or throat CNS depression--slow or shallow breathing, shortness of breath, feeling faint, dizziness, confusion, trouble staying awake Thoughts of suicide or self-harm, worsening mood, feelings of depression Side effects that usually do not require medical attention (report to your care team if they continue or are  bothersome): Change in sex drive or performance Dizziness Drowsiness Nausea This list may not describe all possible side effects. Call your doctor for medical advice about side effects. You may report side effects to FDA at 1-800-FDA-1088. Where should I keep my medication? Keep out of the reach of children and pets. This medication can be abused. Keep it in a safe place to protect it from theft. Do not share it with anyone. It is only for you. Selling or giving away this medication is dangerous and against the law. Store at room temperature between 20 and 25 degrees C (68 and 77 degrees F). Get rid of any unused medication after the expiration date. This medication may cause harm and death if it is taken by other adults, children, or pets. It is important to get rid of the medication as soon as you no longer need it, or it is expired. You can do this in two ways: Take the medication to a medication take-back program. Check with your pharmacy or law enforcement to find a location. If you cannot return the medication, check the label or package insert to see if the medication should be thrown out in the garbage or flushed down the toilet. If you are not sure, ask your care team. If it is safe to put it in the trash, take the medication out of the container. Mix the medication with cat litter, dirt, coffee grounds, or other unwanted substance. Seal the mixture in a bag or container. Put it in the trash. NOTE: This sheet is a summary. It may not cover all possible information. If you have questions about this medicine, talk to your doctor, pharmacist, or health care provider.  2022 Elsevier/Gold Standard (2020-05-02 00:00:00)

## 2021-05-14 NOTE — Progress Notes (Signed)
New Patient Office Visit  Subjective:  Patient ID: Stephanie Chambers, female    DOB: [birth year]  Age: 46 y.o. MRN: [id number]  CC:  Chief Complaint  Patient presents with   Establish Care    Discuss menopause.     HPI Stephanie Chambers presents for new patient visit.  She sees gynecology- Dr. Amalia Hailey. PAP due this 05/2020 and was negative.   She has not had but two menses last night, urinating frequently. Spotting now lightly, last few days. Denies any pain. Hot flashes.  Stephanie Chambers- recurrent UTI. No history of abnormal PAP in best.   She is taking Wellbutrin SR 150 mg once daily. Depression doing well. She feels anxious.  Random anxiety feels related to PMS. She took xanax 0.25 mg and broke in half when her mom was dying of breast cancer and she did well with it. She feels if she had some anxiety as needed medication she would feel better. She does not want to do hormones due mothers breast cancer history. No testing wanted.   Had cardiology work up related to anxiety -2020.  Denies any need for pregnancy test.   She has had sinus congestion and ear pains for over 3 weeks, seen for E- visit last week was started on Claritin and Atrovent nasal spray symptoms mildly improved. She has no fever, has had some mild dizziness she has taken meclizine as well with improvement.   Patient  denies any fever, body aches,chills, rash, chest pain, shortness of breath, nausea, vomiting, or diarrhea.  Denies  pre syncopal or syncopal episodes.    Past Medical History:  Diagnosis Date   Anemia    Anxiety    Bulging of cervical intervertebral disc    Constipation    Depression    major   DUB (dysfunctional uterine bleeding)    Endometrial polyp 05/2012   hysteroscopy w/ d &C   Epigastric abdominal pain    Lump in female breast    Mastodynia    Menorrhagia    PONV (postoperative nausea and vomiting)    UTI (lower urinary tract infection)     Past Surgical History:  Procedure  Laterality Date   BREAST BIOPSY Right 04/24/2018   Affirm Bx- X-clip,  FOCAL SCLEROSING ADENOSIS WITH ASSOCIATED CALCIFICATIONS   BREAST EXCISIONAL BIOPSY Right 10/11/2018   MILD FIBROCYSTIC CHANGES   DIAGNOSTIC LAPAROSCOPY  2004   DILATION AND CURETTAGE OF UTERUS     HYSTEROSCOPY     NEVUS EXCISION Left 10/12/2018   Procedure: NEVUS EXCISION X TWO NECK AND CHEST;  Surgeon: Rolm Bookbinder, MD;  Location: [facility];  Service: General;  Laterality: Left;   RADIOACTIVE SEED GUIDED EXCISIONAL BREAST BIOPSY Right 10/12/2018   Procedure: RADIOACTIVE SEED GUIDED EXCISIONAL RIGHT BREAST BIOPSY AND REMOVAL OF MOLE RIGHT BREAST;  Surgeon: Rolm Bookbinder, MD;  Location: [facility];  Service: General;  Laterality: Right;   TONSILLECTOMY  1998    Family History  Problem Relation Age of Onset   Cancer Mother 62       breast with liver mets   Breast cancer Mother 38   Cancer Father 37       colon, lung, kidney   Heart disease Father    Colon cancer Father    Depression Maternal Grandmother    Cancer Maternal Grandmother        colon   Colon cancer Maternal Grandmother    Depression Paternal Grandmother    Heart disease  Paternal Grandfather    Breast cancer Cousin 84   Hypertension Brother    Ovarian cancer Neg Hx     Social History   Socioeconomic History   Marital status: Married    Spouse name: Not on file   Number of children: Not on file   Years of education: Not on file   Highest education level: Not on file  Occupational History   Not on file  Tobacco Use   Smoking status: Former   Smokeless tobacco: Never  Vaping Use   Vaping Use: Never used  Substance and Sexual Activity   Alcohol use: Yes    Alcohol/week: 0.0 standard drinks    Comment: occassional   Drug use: No   Sexual activity: Yes    Birth control/protection: None  Other Topics Concern   Not on file  Social History Narrative   Not on file   Social Determinants of Health    Financial Resource Strain: Not on file  Food Insecurity: Not on file  Transportation Needs: Not on file  Physical Activity: Not on file  Stress: Not on file  Social Connections: Not on file  Intimate Partner Violence: Not on file    ROS Review of Systems  Constitutional:  Positive for fatigue. Negative for activity change, appetite change, chills, diaphoresis, fever and unexpected weight change.  HENT:  Positive for congestion, ear pain, postnasal drip, rhinorrhea, sinus pressure and sinus pain. Negative for tinnitus, trouble swallowing and voice change.   Respiratory: Negative.    Cardiovascular: Negative.   Gastrointestinal: Negative.   Genitourinary:        Urinary frequency sees urology for follow.   Musculoskeletal: Negative.   Skin: Negative.   Psychiatric/Behavioral:  Negative for decreased concentration, self-injury, sleep disturbance and suicidal ideas. The patient is nervous/anxious. The patient is not hyperactive.    Objective:   Today's Vitals: BP 126/76 (BP Location: Left Arm, Patient Position: Sitting, Cuff Size: Small)    Pulse 75    Temp 98 F (36.7 C) (Oral)    Resp 14    Ht 5' 6.5" (1.689 m)    Wt 144 lb (65.3 kg)    LMP 01/17/2021    SpO2 98%    BMI 22.89 kg/m   Physical Exam Vitals reviewed.  Constitutional:      Appearance: She is well-developed.  HENT:     Head: Normocephalic and atraumatic.     Jaw: There is normal jaw occlusion.     Salivary Glands: Right salivary gland is not diffusely enlarged or tender. Left salivary gland is not diffusely enlarged or tender.     Right Ear: Hearing, ear canal and external ear normal. No decreased hearing noted. No drainage, swelling or tenderness. A middle ear effusion is present. No foreign body. Tympanic membrane is injected and erythematous. Tympanic membrane is not perforated or bulging.     Left Ear: Hearing, ear canal and external ear normal. No decreased hearing noted. No drainage, swelling or tenderness. A  middle ear effusion is present. No foreign body. Tympanic membrane is not injected, erythematous or bulging.     Ears:     Comments: Dark tan brown fluid behind intact bilateral tympanic membranes that are both dull on appearance.     Nose: Mucosal edema and congestion present. No rhinorrhea.     Right Turbinates: Not swollen.     Left Turbinates: Not swollen.     Right Sinus: No maxillary sinus tenderness or frontal sinus tenderness.  Left Sinus: No maxillary sinus tenderness or frontal sinus tenderness.     Mouth/Throat:     Pharynx: Uvula midline. No oropharyngeal exudate or posterior oropharyngeal erythema.     Tonsils: No tonsillar abscesses.  Eyes:     Conjunctiva/sclera: Conjunctivae normal.  Cardiovascular:     Rate and Rhythm: Regular rhythm.     Pulses: Normal pulses.     Heart sounds: Normal heart sounds.  Pulmonary:     Effort: Pulmonary effort is normal.     Breath sounds: Normal breath sounds. No wheezing, rhonchi or rales.  Lymphadenopathy:     Head:     Right side of head: No submental, submandibular, tonsillar, preauricular, posterior auricular or occipital adenopathy.     Left side of head: No submental, submandibular, tonsillar, preauricular, posterior auricular or occipital adenopathy.     Cervical: No cervical adenopathy.  Skin:    General: Skin is warm and dry.  Neurological:     Mental Status: She is alert.  Psychiatric:        Speech: Speech normal.        Behavior: Behavior normal.        Thought Content: Thought content normal.    Assessment & Plan:   Problem List Items Addressed This Visit       Respiratory   Acute non-recurrent pansinusitis   Relevant Medications   amoxicillin-clavulanate (AUGMENTIN) 875-125 MG tablet     Nervous and Auditory   Non-recurrent acute serous otitis media of right ear   Relevant Medications   amoxicillin-clavulanate (AUGMENTIN) 875-125 MG tablet     Other   PMS (premenstrual syndrome)    Still having hot  flashes, anxiety and had two cycled beginning of last year.  Sees Dr. Amalia Hailey for gynecology. PAP was normal 2022 February.  Does not want hormones due to mom passed of breast cancer.       Family history of breast cancer in mother   Relevant Orders   MM 3D SCREEN BREAST BILATERAL   Family history of colon cancer in father   Anxiety - Primary    On Wellbutrin 150 SR once daily currently advise to increase to BID, and will add xanax 0.25mg  to take lowest possible dose as directed/ needed. Contract signed.  Side effects discussed. Recheck in 3 months. PHQ and GAD at that time.       Relevant Medications   ALPRAZolam (XANAX) 0.25 MG tablet   buPROPion (WELLBUTRIN SR) 150 MG 12 hr tablet   Other Relevant Orders   Comprehensive metabolic panel   CBC   TSH   Lipid panel   VITAMIN D 25 Hydroxy (Vit-D Deficiency, Fractures)   Depression   Relevant Medications   ALPRAZolam (XANAX) 0.25 MG tablet   buPROPion (WELLBUTRIN SR) 150 MG 12 hr tablet   Screening mammogram for breast cancer   Relevant Orders   MM 3D SCREEN BREAST BILATERAL    Outpatient Encounter Medications as of 05/14/2021  Medication Sig   ALPRAZolam (XANAX) 0.25 MG tablet Take 1 tablet (0.25 mg total) by mouth 3 (three) times daily as needed for anxiety (will drowsiness.).   amoxicillin-clavulanate (AUGMENTIN) 875-125 MG tablet Take 1 tablet by mouth 2 (two) times daily.   ipratropium (ATROVENT) 0.03 % nasal spray Place 2 sprays into both nostrils every 12 (twelve) hours.   [DISCONTINUED] buPROPion (WELLBUTRIN SR) 150 MG 12 hr tablet TAKE 1 TABLET BY MOUTH TWO TIMES DAILY   buPROPion (WELLBUTRIN SR) 150 MG 12 hr tablet TAKE 1  TABLET BY MOUTH TWO TIMES DAILY   loratadine (CLARITIN) 10 MG tablet Take 10 mg by mouth daily. (Patient not taking: Reported on 05/14/2021)   vitamin E 180 MG (400 UNITS) capsule Take 800 Units by mouth daily. (Patient not taking: Reported on 05/14/2021)   [DISCONTINUED] sulfamethoxazole-trimethoprim  (BACTRIM DS) 800-160 MG tablet Take 1 tablet by mouth every 12 (twelve) hours. (Patient not taking: Reported on 05/14/2021)   No facility-administered encounter medications on file as of 05/14/2021.   PMDP aware evaluated.  Discussed medications prescribed.   Schedule pap and mamogram when due.   Red Flags discussed. The patient was given clear instructions to go to ER or return to medical center if any red flags develop, symptoms do not improve, worsen or new problems develop. They verbalized understanding.  Follow-up: Return in about 3 months (around 08/12/2021), or if symptoms worsen or fail to improve, for at any time for any worsening symptoms, Go to Emergency room/ urgent care if worse.   Marcille Buffy, FNP

## 2021-05-14 NOTE — Assessment & Plan Note (Signed)
On Wellbutrin 150 SR once daily currently advise to increase to BID, and will add xanax 0.25mg  to take lowest possible dose as directed/ needed. Contract signed.  Side effects discussed. Recheck in 3 months. PHQ and GAD at that time.

## 2021-05-14 NOTE — Assessment & Plan Note (Signed)
Still having hot flashes, anxiety and had two cycled beginning of last year.  Sees Dr. Amalia Hailey for gynecology. PAP was normal 2022 February.  Does not want hormones due to mom passed of breast cancer.

## 2021-06-02 ENCOUNTER — Encounter: Payer: Self-pay | Admitting: Obstetrics and Gynecology

## 2021-06-02 DIAGNOSIS — Z01419 Encounter for gynecological examination (general) (routine) without abnormal findings: Secondary | ICD-10-CM

## 2021-08-04 ENCOUNTER — Ambulatory Visit
Admission: RE | Admit: 2021-08-04 | Discharge: 2021-08-04 | Disposition: A | Discharge status: Home / Self Care | Payer: No Typology Code available for payment source | Source: Ambulatory Visit | Attending: Adult Health | Admitting: Adult Health

## 2021-08-04 DIAGNOSIS — Z803 Family history of malignant neoplasm of breast: Secondary | ICD-10-CM

## 2021-08-04 DIAGNOSIS — Z1231 Encounter for screening mammogram for malignant neoplasm of breast: Secondary | ICD-10-CM

## 2021-08-14 ENCOUNTER — Emergency Department: Payer: No Typology Code available for payment source

## 2021-08-14 ENCOUNTER — Other Ambulatory Visit: Payer: Self-pay

## 2021-08-14 ENCOUNTER — Emergency Department
Admission: EM | Admit: 2021-08-14 | Discharge: 2021-08-14 | Disposition: A | Discharge status: Home / Self Care | Payer: No Typology Code available for payment source | Attending: Emergency Medicine | Admitting: Emergency Medicine

## 2021-08-14 DIAGNOSIS — R102 Pelvic and perineal pain: Secondary | ICD-10-CM | POA: Diagnosis present

## 2021-08-14 DIAGNOSIS — N3090 Cystitis, unspecified without hematuria: Secondary | ICD-10-CM | POA: Diagnosis not present

## 2021-08-14 DIAGNOSIS — R109 Unspecified abdominal pain: Secondary | ICD-10-CM

## 2021-08-14 DIAGNOSIS — N83201 Unspecified ovarian cyst, right side: Secondary | ICD-10-CM | POA: Insufficient documentation

## 2021-08-14 LAB — COMPREHENSIVE METABOLIC PANEL
ALT: 11 U/L (ref 0–44)
AST: 17 U/L (ref 15–41)
Albumin: 4.4 g/dL (ref 3.5–5.0)
Alkaline Phosphatase: 49 U/L (ref 38–126)
Anion gap: 8 (ref 5–15)
BUN: 14 mg/dL (ref 6–20)
CO2: 25 mmol/L (ref 22–32)
Calcium: 9.5 mg/dL (ref 8.9–10.3)
Chloride: 106 mmol/L (ref 98–111)
Creatinine, Ser: 1.08 mg/dL — ABNORMAL HIGH (ref 0.44–1.00)
GFR, Estimated: >60 mL/min (ref >60)
Glucose, Bld: 116 mg/dL — ABNORMAL HIGH (ref 70–99)
Potassium: 4 mmol/L (ref 3.5–5.1)
Sodium: 139 mmol/L (ref 135–145)
Total Bilirubin: 0.6 mg/dL (ref 0.3–1.2)
Total Protein: 6.8 g/dL (ref 6.5–8.1)

## 2021-08-14 LAB — URINALYSIS, ROUTINE W REFLEX MICROSCOPIC
Bilirubin Urine: NEGATIVE
Glucose, UA: NEGATIVE mg/dL
Hgb urine dipstick: NEGATIVE
Ketones, ur: 5 mg/dL — AB
Leukocytes,Ua: NEGATIVE
Nitrite: NEGATIVE
Protein, ur: 30 mg/dL — AB
Specific Gravity, Urine: 1.015 (ref 1.005–1.030)
pH: 8 (ref 5.0–8.0)

## 2021-08-14 LAB — CBC
HCT: 39.6 % (ref 36.0–46.0)
Hemoglobin: 13.3 g/dL (ref 12.0–15.0)
MCH: 32.3 pg (ref 26.0–34.0)
MCHC: 33.6 g/dL (ref 30.0–36.0)
MCV: 96.1 fL (ref 80.0–100.0)
Platelets: 205 10*3/uL (ref 150–400)
RBC: 4.12 MIL/uL (ref 3.87–5.11)
RDW: 11.8 % (ref 11.5–15.5)
WBC: 7.2 10*3/uL (ref 4.0–10.5)
nRBC: 0 % (ref 0.0–0.2)

## 2021-08-14 LAB — POC URINE PREG, ED: Preg Test, Ur: NEGATIVE

## 2021-08-14 LAB — LIPASE, BLOOD: Lipase: 29 U/L (ref 11–51)

## 2021-08-14 MED ORDER — KETOROLAC TROMETHAMINE 30 MG/ML IJ SOLN
15.0000 mg | Freq: Once | INTRAMUSCULAR | Status: AC
  Administered 2021-08-14: 15 mg via INTRAVENOUS
  Filled 2021-08-14 (×3): qty 1

## 2021-08-14 MED ORDER — IBUPROFEN 800 MG PO TABS
800.0000 mg | ORAL_TABLET | Freq: Three times a day (TID) | ORAL | 0 refills | Status: DC | PRN
  Filled 2021-08-14: qty 20, 7d supply, fill #0

## 2021-08-14 MED ORDER — ONDANSETRON 4 MG PO TBDP
4.0000 mg | ORAL_TABLET | Freq: Three times a day (TID) | ORAL | 0 refills | Status: DC | PRN
  Filled 2021-08-14: qty 12, 4d supply, fill #0

## 2021-08-14 MED ORDER — HYDROCODONE-ACETAMINOPHEN 5-325 MG PO TABS
1.0000 | ORAL_TABLET | Freq: Four times a day (QID) | ORAL | 0 refills | Status: DC | PRN
  Filled 2021-08-14: qty 10, 2d supply, fill #0

## 2021-08-14 MED ORDER — MORPHINE SULFATE (PF) 4 MG/ML IV SOLN
4.0000 mg | Freq: Once | INTRAVENOUS | Status: DC
  Filled 2021-08-14 (×2): qty 1

## 2021-08-14 MED ORDER — FOSFOMYCIN TROMETHAMINE 3 G PO PACK
3.0000 g | PACK | Freq: Once | ORAL | Status: AC
  Administered 2021-08-14: 3 g via ORAL
  Filled 2021-08-14: qty 3

## 2021-08-14 MED ORDER — ONDANSETRON HCL 4 MG/2ML IJ SOLN
4.0000 mg | Freq: Once | INTRAMUSCULAR | Status: AC
  Administered 2021-08-14: 4 mg via INTRAVENOUS
  Filled 2021-08-14 (×3): qty 2

## 2021-08-14 NOTE — ED Provider Notes (Signed)
? ?[facility] ?Provider Note ? ? ? Event Date/Time  ? First MD Initiated Contact with Patient 08/14/21 1047   ?  (approximate) ? ? ?History  ? ?Abdominal Pain ? ? ?HPI ? ?Stephanie Chambers is a 46 y.o. female   with past medical history of recurrent UTIs, dysfunctional uterine bleeding, perimenopausal, here with abdominal pain.  Patient states that she was in her usual state of health upon going to work this morning.  When she was driving, she experienced fairly cute onset of progressively more severe lower abdominal/pelvic pain.  The pain became 10 out of 10 in severity, and she became nauseous, diaphoretic, and felt like she is going to pass out.  She was able to return home.  She then reports that she had to essentially lay down due to the pain and this lasted severely for at least 20 to 30 minutes.  It has then gradually improved.  She has some persistent lower abdominal cramping.  Denies any history of similar issues.  She does have history UTIs but has not had any urinary symptoms and denies any history of bladder spasms or pain similar to this with her prior UTIs.  Of note, she does state that she is likely perimenopausal and has been having irregular periods.  She denies any history of ovarian cyst recently, but did have some when she was younger.  No history of ovarian torsion.  Denies any vaginal bleeding.  No dyspareunia or vaginal discharge. ? ?  ? ? ?Physical Exam  ? ?Triage Vital Signs: ?ED Triage Vitals  ?Enc Vitals Group  ?   BP 08/14/21 1010 116/65  ?   Pulse Rate 08/14/21 1010 60  ?   Resp 08/14/21 1010 18  ?   Temp 08/14/21 1010 98 ?F (36.7 ?C)  ?   Temp Source 08/14/21 1010 Oral  ?   SpO2 08/14/21 1010 99 %  ?   Weight 08/14/21 1010 137 lb (62.1 kg)  ?   Height 08/14/21 1010 '5\' 6"'$  (1.676 m)  ?   Head Circumference --   ?   Peak Flow --   ?   Pain Score 08/14/21 1048 3  ?   Pain Loc --   ?   Pain Edu? --   ?   Excl. in [place]? --   ? ? ?Most recent vital signs: ?Vitals:  ?  08/14/21 1316 08/14/21 1317  ?BP: 118/68 103/65  ?Pulse: 68 62  ?Resp: 18 18  ?Temp:  98.3 ?F (36.8 ?C)  ?SpO2: 99% 99%  ? ? ? ?General: Awake, no distress.  ?CV:  Good peripheral perfusion.  ?Resp:  Normal effort.  ?Abd:  No distention.  Minimal suprapubic tenderness.  No rebound or guarding.  No distention.  No tenderness in the right lower quadrant or at McBurney's.  Negative Murphy's. ?Other:  Moist mucous membranes. ? ? ?ED Results / Procedures / Treatments  ? ?Labs ?(all labs ordered are listed, but only abnormal results are displayed) ?Labs Reviewed  ?COMPREHENSIVE METABOLIC PANEL - Abnormal; Notable for the following components:  ?    Result Value  ? Glucose, Bld 116 (*)   ? Creatinine, Ser 1.08 (*)   ? All other components within normal limits  ?URINALYSIS, ROUTINE W REFLEX MICROSCOPIC - Abnormal; Notable for the following components:  ? Color, Urine YELLOW (*)   ? APPearance HAZY (*)   ? Ketones, ur 5 (*)   ? Protein, ur 30 (*)   ?  Bacteria, UA RARE (*)   ? All other components within normal limits  ?URINE CULTURE  ?LIPASE, BLOOD  ?CBC  ?POC URINE PREG, ED  ? ? ? ?EKG ? ? ? ?RADIOLOGY ?Transvaginal ultrasound: Small volume complex free fluid within the pelvis, likely blood products from ruptured cyst, small complex right ovarian cyst ?Renal ultrasound: Unremarkable ? ? ?I also independently reviewed and agree with radiologist interpretations. ? ? ?PROCEDURES: ? ?Critical Care performed: No ? ? ?MEDICATIONS ORDERED IN ED: ?Medications  ?ketorolac (TORADOL) 30 MG/ML injection 15 mg (15 mg Intravenous Given 08/14/21 1315)  ?ondansetron (ZOFRAN) injection 4 mg (4 mg Intravenous Given 08/14/21 1315)  ?fosfomycin (MONUROL) packet 3 g (3 g Oral Given 08/14/21 1348)  ? ? ? ?IMPRESSION / MDM / ASSESSMENT AND PLAN / ED COURSE  ?I reviewed the triage vital signs and the nursing notes. ?             ?               ? ?Ddx:  ?Ovarian cyst with rupture, torsion, dysmenorrhea/dysfunctional uterine bleeding, uterine  cramping, UTI with bladder spasms, less likely diverticulitis or colitis ? ? ?MDM:  ?46 year old female here with acute, lower abdominal discomfort.  Patient is perimenopausal and I suspect her symptoms are related to possible ruptured ovarian cyst.  Ultrasound obtained, reviewed, and does show a complex right cyst as well as small volume of free fluid which I suspect is blood products from a recently ruptured cyst.  Her hemoglobin is stable.  She is hemodynamically stable.  No evidence to suggest ongoing bleeding.  CMP is unremarkable with normal renal function.  She is not on blood thinners.  UA shows some mild dehydration and pyuria.  She has some chronic recurrent UTIs but in the absence of other urinary symptoms, I do not suspect this is the etiology of her pain.  That being said, will treat for UTI in the event of possible bladder spasm component. Given her prior UTI history, will give dose of fosfomycin with repeat dosing as indicated based on Cx. Otherwise, she is hemodynamically stable and feels better after Toradol and is tolerating p.o.  Feel she can be really managed as an outpatient.  Term cautions given. ? ? ?MEDICATIONS GIVEN IN ED: ?Medications  ?ketorolac (TORADOL) 30 MG/ML injection 15 mg (15 mg Intravenous Given 08/14/21 1315)  ?ondansetron (ZOFRAN) injection 4 mg (4 mg Intravenous Given 08/14/21 1315)  ?fosfomycin (MONUROL) packet 3 g (3 g Oral Given 08/14/21 1348)  ? ? ? ?Consults:  ? ? ? ?EMR reviewed  ?Reviewed PCP notes, as well as urology notes with Ellie Lunch for recurrent UTI visits ? ? ? ?FINAL CLINICAL IMPRESSION(S) / ED DIAGNOSES  ? ?Final diagnoses:  ?Cyst of right ovary  ?Pelvic pain  ?Cystitis  ? ? ? ?Rx / DC Orders  ? ?ED Discharge Orders   ? ?      Ordered  ?  ibuprofen (ADVIL) 800 MG tablet  Every 8 hours PRN       ? 08/14/21 1358  ?  ondansetron (ZOFRAN-ODT) 4 MG disintegrating tablet  Every 8 hours PRN       ? 08/14/21 1358  ?  HYDROcodone-acetaminophen (NORCO/VICODIN) 5-325 MG  tablet  Every 6 hours PRN       ? 08/14/21 1358  ? ?  ?  ? ?  ? ? ? ?Note:  This document was prepared using Dragon voice recognition software and may include unintentional dictation errors. ?  ?  Duffy Bruce, MD ?08/14/21 1359 ? ?

## 2021-08-14 NOTE — Discharge Instructions (Signed)
Take the ibuprofen as needed.  I would recommend taking this at least 3 times a day for the next 1 to 2 days to help prevent worsening pain. ? ?Take the hydrocodone for severe pain. ? ?I recommend calling your OB/GYN to discuss your visit and to arrange follow-up as well as repeat ultrasound.  If this becomes a recurrent issue, occasionally your OB would prescribe medications to help prevent cyst. ? ?We have sent a urine culture and we will call you if the results are positive. ?

## 2021-08-14 NOTE — ED Notes (Signed)
Pt declined meds for nausea/pain at this time. Call light within reach if pt decides she needs the meds.  ?

## 2021-08-14 NOTE — ED Triage Notes (Signed)
Pt comes into the ED via EMS from home with sudden onset suprapubic pain with nausea started around 9am today, denies vomiting..  ? ?115/53 ?[id number] ?#[id number] ? ?

## 2021-08-14 NOTE — ED Notes (Signed)
Pt provided with crackers and ginger ale for PO challenge. ?

## 2021-08-16 LAB — URINE CULTURE

## 2021-09-07 ENCOUNTER — Other Ambulatory Visit: Payer: Self-pay

## 2021-09-07 ENCOUNTER — Other Ambulatory Visit: Payer: Self-pay | Admitting: Adult Health

## 2021-09-07 DIAGNOSIS — F419 Anxiety disorder, unspecified: Secondary | ICD-10-CM

## 2021-09-08 ENCOUNTER — Other Ambulatory Visit: Payer: Self-pay

## 2021-09-10 ENCOUNTER — Other Ambulatory Visit: Payer: Self-pay

## 2021-09-15 ENCOUNTER — Other Ambulatory Visit: Payer: Self-pay

## 2021-09-15 DIAGNOSIS — F419 Anxiety disorder, unspecified: Secondary | ICD-10-CM

## 2021-09-16 ENCOUNTER — Other Ambulatory Visit: Payer: Self-pay

## 2021-09-16 NOTE — Telephone Encounter (Signed)
Pt need refill on ALPRAZolam sent to [place]

## 2021-09-16 NOTE — Telephone Encounter (Signed)
Pt scheduled tomorrow at 12:45p.

## 2021-09-17 ENCOUNTER — Ambulatory Visit (INDEPENDENT_AMBULATORY_CARE_PROVIDER_SITE_OTHER): Payer: No Typology Code available for payment source | Admitting: Internal Medicine

## 2021-09-17 ENCOUNTER — Telehealth: Payer: Self-pay | Admitting: Internal Medicine

## 2021-09-17 ENCOUNTER — Encounter: Payer: Self-pay | Admitting: Internal Medicine

## 2021-09-17 ENCOUNTER — Other Ambulatory Visit: Payer: Self-pay

## 2021-09-17 VITALS — BP 100/74 | HR 78 | Temp 97.7°F | Ht 66.0 in | Wt 139.8 lb

## 2021-09-17 DIAGNOSIS — F419 Anxiety disorder, unspecified: Secondary | ICD-10-CM | POA: Diagnosis not present

## 2021-09-17 DIAGNOSIS — N83201 Unspecified ovarian cyst, right side: Secondary | ICD-10-CM | POA: Diagnosis not present

## 2021-09-17 MED ORDER — ALPRAZOLAM 0.25 MG PO TABS
0.2500 mg | ORAL_TABLET | Freq: Every evening | ORAL | 5 refills | Status: DC | PRN
  Filled 2021-09-17: qty 30, 30d supply, fill #0

## 2021-09-17 NOTE — Progress Notes (Unsigned)
Subjective:  Patient ID: Stephanie Chambers, female    DOB: [birth year]  Age: 46 y.o. MRN: [id number]  CC: There were no encounter diagnoses.   HPI SHAKENNA HERRERO presents for  Chief Complaint  Patient presents with   Medication Refill    Medication refill on Alprazolam    46 yr old female with history of depression managed with wellbutrin SR 150 mg daily for many years. TAkes it once daily because twice daly dosing causes increased anxiety and insomnia.    Increased stress :her  father not doing well  since her mother died last 2023/04/23 , and requiring increased help. And medical care   She has been Using 1/2 alprazolam  prn , not nightly to manage anxiety   Recent ER visit for lower abd pain :  reviewed ER visit;  ruptured ovarian cyst presumed.  Still has a right complex ovarian cyst triggered by  menopause .  Perimenopause:   wants to avoid HRT given mother's history of ER positive BRCA    Outpatient Medications Prior to Visit  Medication Sig Dispense Refill   ALPRAZolam (XANAX) 0.25 MG tablet Take 1 tablet (0.25 mg total) by mouth 3 (three) times daily as needed for anxiety (will drowsiness.). 30 tablet 0   buPROPion (WELLBUTRIN SR) 150 MG 12 hr tablet TAKE 1 TABLET BY MOUTH TWO TIMES DAILY 120 tablet 6   ipratropium (ATROVENT) 0.03 % nasal spray Place 2 sprays into both nostrils every 12 (twelve) hours. 30 mL 0   loratadine (CLARITIN) 10 MG tablet Take 10 mg by mouth daily.     ondansetron (ZOFRAN-ODT) 4 MG disintegrating tablet Take 1 tablet (4 mg total) by mouth every 8 (eight) hours as needed for nausea or vomiting. 12 tablet 0   amoxicillin-clavulanate (AUGMENTIN) 875-125 MG tablet Take 1 tablet by mouth 2 (two) times daily. (Patient not taking: Reported on 09/17/2021) 20 tablet 0   HYDROcodone-acetaminophen (NORCO/VICODIN) 5-325 MG tablet Take 1-2 tablets by mouth every 6 (six) hours as needed for moderate pain or severe pain (no more than 6 tabs daily). (Patient not  taking: Reported on 09/17/2021) 10 tablet 0   ibuprofen (ADVIL) 800 MG tablet Take 1 tablet (800 mg total) by mouth every 8 (eight) hours as needed for moderate pain. (Patient not taking: Reported on 09/17/2021) 20 tablet 0   vitamin E 180 MG (400 UNITS) capsule Take 800 Units by mouth daily. (Patient not taking: Reported on 09/17/2021)     No facility-administered medications prior to visit.    Review of Systems;  Patient denies headache, fevers, malaise, unintentional weight loss, skin rash, eye pain, sinus congestion and sinus pain, sore throat, dysphagia,  hemoptysis , cough, dyspnea, wheezing, chest pain, palpitations, orthopnea, edema, abdominal pain, nausea, melena, diarrhea, constipation, flank pain, dysuria, hematuria, urinary  Frequency, nocturia, numbness, tingling, seizures,  Focal weakness, Loss of consciousness,  Tremor, insomnia, depression, anxiety, and suicidal ideation.      Objective:  BP 100/74 (BP Location: Left Arm, Patient Position: Sitting, Cuff Size: Normal)   Pulse 78   Temp 97.7 F (36.5 C) (Oral)   Ht 5' 6"  (1.676 m)   Wt 139 lb 12.8 oz (63.4 kg)   SpO2 96%   BMI 22.56 kg/m   BP Readings from Last 3 Encounters:  09/17/21 100/74  08/14/21 110/78  05/14/21 126/76    Wt Readings from Last 3 Encounters:  09/17/21 139 lb 12.8 oz (63.4 kg)  08/14/21 137 lb (62.1 kg)  05/14/21 144  lb (65.3 kg)    General appearance: alert, cooperative and appears stated age Ears: normal TM's and external ear canals both ears Throat: lips, mucosa, and tongue normal; teeth and gums normal Neck: no adenopathy, no carotid bruit, supple, symmetrical, trachea midline and thyroid not enlarged, symmetric, no tenderness/mass/nodules Back: symmetric, no curvature. ROM normal. No CVA tenderness. Lungs: clear to auscultation bilaterally Heart: regular rate and rhythm, S1, S2 normal, no murmur, click, rub or gallop Abdomen: soft, non-tender; bowel sounds normal; no masses,  no  organomegaly Pulses: 2+ and symmetric Skin: Skin color, texture, turgor normal. No rashes or lesions Lymph nodes: Cervical, supraclavicular, and axillary nodes normal.  Lab Results  Component Value Date   HGBA1C 5.1 06/03/2020    Lab Results  Component Value Date   CREATININE 1.08 (H) 08/14/2021   CREATININE 0.95 11/16/2017   CREATININE 0.95 07/15/2017    Lab Results  Component Value Date   WBC 7.2 08/14/2021   HGB 13.3 08/14/2021   HCT 39.6 08/14/2021   PLT 205 08/14/2021   GLUCOSE 116 (H) 08/14/2021   CHOL 198 06/03/2020   TRIG 57 06/03/2020   HDL 77 06/03/2020   LDLCALC 110 (H) 06/03/2020   ALT 11 08/14/2021   AST 17 08/14/2021   NA 139 08/14/2021   K 4.0 08/14/2021   CL 106 08/14/2021   CREATININE 1.08 (H) 08/14/2021   BUN 14 08/14/2021   CO2 25 08/14/2021   TSH 1.040 06/03/2020   HGBA1C 5.1 06/03/2020    US RENAL  Result Date: 08/14/2021 CLINICAL DATA:  Flank pain EXAM: RENAL / URINARY TRACT ULTRASOUND COMPLETE COMPARISON:  03/06/2014 FINDINGS: Right Kidney: Renal measurements: 9.9 x 3.8 x 4.3 cm = volume: 84 mL. Echogenicity within normal limits. No mass or hydronephrosis visualized. Left Kidney: Renal measurements: 9.3 x 4.5 x 4.1 cm = volume: 89 mL. Echogenicity within normal limits. No mass or hydronephrosis visualized. Bladder: Appears normal for degree of bladder distention. Other: None. IMPRESSION: Unremarkable ultrasound of the kidneys and urinary bladder. Electronically Signed   By: Davina Poke D.O.   On: 08/14/2021 12:40   US PELVIC COMPLETE W TRANSVAGINAL AND TORSION R/O  Result Date: 08/14/2021 CLINICAL DATA:  Pelvic pain for 1 day EXAM: TRANSABDOMINAL AND TRANSVAGINAL ULTRASOUND OF PELVIS DOPPLER ULTRASOUND OF OVARIES TECHNIQUE: Both transabdominal and transvaginal ultrasound examinations of the pelvis were performed. Transabdominal technique was performed for global imaging of the pelvis including uterus, ovaries, adnexal regions, and pelvic  cul-de-sac. It was necessary to proceed with endovaginal exam following the transabdominal exam to visualize the ovaries. Color and duplex Doppler ultrasound was utilized to evaluate blood flow to the ovaries. COMPARISON:  05/29/2014 FINDINGS: Uterus Measurements: 8.3 x 4.3 x 4.9 cm = volume: 91 mL. No fibroids or other mass visualized. Endometrium Thickness: 12 mm.  No focal abnormality visualized. Right ovary Measurements: 4.0 x 3.1 x 3.1 cm = volume: 20 mL. 1.9 cm nonvascular hypoechoic lesion with increased through transmission and peripheral blood flow may represent a corpus luteal cyst or possibly endometrioma. Additional simple follicle incidentally noted within the right ovary. Left ovary Measurements: 3.9 x 2.1 x 2.4 cm = volume: 10 mL. Normal appearance/no adnexal mass. Pulsed Doppler evaluation of both ovaries demonstrates normal low-resistance arterial and venous waveforms. Other findings Small volume complex free fluid within the pelvis with internal echoes. IMPRESSION: 1. No evidence of adnexal torsion. 2. Small volume complex free fluid within the pelvis with internal echoes, possibly representing blood products. Etiology is uncertain and could  be sequela of a ruptured cyst. 3. Small complex right ovarian cyst measuring 1.9 cm, possibly corpus luteal cyst or endometrioma. Follow-up ultrasound in 6-12 weeks is recommended. This recommendation follows the consensus statement: Management of Asymptomatic Ovarian and Other Adnexal Cysts Imaged at [place]: Society of Radiologists in [place]. Radiology 2010; [phone]. Electronically Signed   By: Davina Poke D.O.   On: 08/14/2021 12:38    Assessment & Plan:   Problem List Items Addressed This Visit   None   I spent a total of   minutes with this patient in a face to face visit on the date of this encounter reviewing the last office visit with me on        ,  most recent with patient's cardiologist in    ,   patient'ss diet and eating habits, home blood pressure readings ,  most recent imaging study ,   and post visit ordering of testing and therapeutics.    Follow-up: No follow-ups on file.   Crecencio Mc, MD

## 2021-09-17 NOTE — Patient Instructions (Addendum)
I have refilled  the alprazolam    I have asked the front office to put you in my clinic going forward   Ultrasound needs repeating in late July to follow up on right ovarian complex cyst.

## 2021-09-17 NOTE — Telephone Encounter (Signed)
Dr Derrel Nip has excepted patient. Called patient to set up a TOC appointment, vb was full.

## 2021-09-19 DIAGNOSIS — N83201 Unspecified ovarian cyst, right side: Secondary | ICD-10-CM | POA: Insufficient documentation

## 2021-09-19 NOTE — Assessment & Plan Note (Addendum)
Aggravated by health issues of father and  Recent death of mother. She is taking Wellbutrin 150 SR once daily, has never taken it more  than once daily.  Using  xanax 0.'25mg'$  to take lowest possible dose as directed/ needed. . PHQ and GAD at that time.

## 2021-09-19 NOTE — Assessment & Plan Note (Addendum)
Suggested by recent CT scam/ Follow up ultrasound needed

## 2021-11-03 ENCOUNTER — Encounter: Payer: Self-pay | Admitting: Obstetrics and Gynecology

## 2021-11-03 ENCOUNTER — Ambulatory Visit (INDEPENDENT_AMBULATORY_CARE_PROVIDER_SITE_OTHER): Payer: No Typology Code available for payment source | Admitting: Obstetrics and Gynecology

## 2021-11-03 VITALS — BP 111/62 | HR 65 | Ht 66.0 in | Wt 138.0 lb

## 2021-11-03 DIAGNOSIS — Z8742 Personal history of other diseases of the female genital tract: Secondary | ICD-10-CM | POA: Diagnosis not present

## 2021-11-03 DIAGNOSIS — Z803 Family history of malignant neoplasm of breast: Secondary | ICD-10-CM | POA: Diagnosis not present

## 2021-11-03 DIAGNOSIS — N951 Menopausal and female climacteric states: Secondary | ICD-10-CM

## 2021-11-03 NOTE — Progress Notes (Signed)
GYNECOLOGY PROGRESS NOTE  Subjective:    Patient ID: Stephanie Chambers, female    DOB: 09-13-1975, 46 y.o.   MRN: [id number]  HPI  Patient is a 46 y.o. G1P1 female who presents for follow up discussion of her perimenopausal symptoms, ovarian cysts, and pelvic pain.  Desires a second opinion, was previously seen by Dr. Jeannie Fend. Patient reports that last month she was seen in the Emergency Room on 09/17/2021 due to pelvic pain.  Was note to have a ruptured cyst on one side, and a complex smaller cyst on the right. She notes that the pain began to ease up several weeks later, only to have a second episode occur. Since then she has has some intermittent pain until this past Friday, where the pain became more steady and constant.  Pain is located in lower pelvis, mostly midline.   Patient also with bothersome perimenopausal symptoms, states that she has only had 3 cycles in the past year.  Is experiencing hot flushes, night sweats, and anxiety. Saw Dr. Amalia Hailey who discussed HRT therapy.  Patient notes that she is fearful of hormonal therapy as her mother passed from breast cancer (was ER positive). Has spoken to her PCP about her symptoms recently, who offered patient increase in her Wellbutrin (had been on same dose for many years, but recently increased from 150 mg to 300 mg), and also gave small supply of Xanax. Patient notes that increasing her Wellbutrin dose has helped her anxiety. Has only taken 1 or 2 Xanax as she really does not desire to utilize that medication much. Also has had workup in the past including thyroid studies and Holter monitoring due to worsening symptoms of anxiety.   The following portions of the patient's history were reviewed and updated as appropriate:  She  has a past medical history of Anemia, Anxiety, Bulging of cervical intervertebral disc, Constipation, Depression, DUB (dysfunctional uterine bleeding), Endometrial polyp (05/2012), Epigastric abdominal pain, Lump in  female breast, Mastodynia, Menorrhagia, PONV (postoperative nausea and vomiting), and UTI (lower urinary tract infection).  She  has a past surgical history that includes Tonsillectomy (1998); Diagnostic laparoscopy (2004); Hysteroscopy; Dilation and curettage of uterus; Radioactive seed guided excisional breast biopsy (Right, 10/12/2018); Nevus excision (Left, 10/12/2018); Breast biopsy (Right, 04/24/2018); and Breast excisional biopsy (Right, 10/11/2018).  Her family history includes Breast cancer (age of onset: 79) in her mother; Breast cancer (age of onset: 44) in her cousin; Cancer in her maternal grandmother; Cancer (age of onset: 53) in her mother; Cancer (age of onset: 20) in her father; Colon cancer in her father and maternal grandmother; Depression in her maternal grandmother and paternal grandmother; Heart disease in her father and paternal grandfather; Hypertension in her brother.  She  reports that she has quit smoking. She has never used smokeless tobacco. She reports current alcohol use. She reports that she does not use drugs.  Current Outpatient Medications on File Prior to Visit  Medication Sig Dispense Refill   buPROPion (WELLBUTRIN SR) 150 MG 12 hr tablet TAKE 1 TABLET BY MOUTH TWO TIMES DAILY 120 tablet 6   ALPRAZolam (XANAX) 0.25 MG tablet Take 1 tablet (0.25 mg total) by mouth at bedtime as needed for anxiety (will drowsiness.). (Patient not taking: Reported on 11/03/2021) 30 tablet 5   ipratropium (ATROVENT) 0.03 % nasal spray Place 2 sprays into both nostrils every 12 (twelve) hours. (Patient not taking: Reported on 11/03/2021) 30 mL 0   loratadine (CLARITIN) 10 MG tablet Take 10  mg by mouth daily. (Patient not taking: Reported on 11/03/2021)     ondansetron (ZOFRAN-ODT) 4 MG disintegrating tablet Take 1 tablet (4 mg total) by mouth every 8 (eight) hours as needed for nausea or vomiting. (Patient not taking: Reported on 11/03/2021) 12 tablet 0   No current facility-administered  medications on file prior to visit.   She is allergic to cefdinir, almond meal, Vikas Wegmann, and peach [prunus persica]..  Review of Systems Pertinent items noted in HPI and remainder of comprehensive ROS otherwise negative.   Objective:   Blood pressure 111/62, pulse 65, height '5\' 6"'$  (1.676 m), weight 138 lb (62.6 kg), last menstrual period 10/15/2021. Body mass index is 22.27 kg/m. General appearance: alert and no distress Abdomen: soft, non-tender; bowel sounds normal; no masses,  no organomegaly Pelvic: deferred Extremities: extremities normal, atraumatic, no cyanosis or edema Neurologic: Grossly normal    Imaging:  US RENAL CLINICAL DATA:  Flank pain  EXAM: RENAL / URINARY TRACT ULTRASOUND COMPLETE  COMPARISON:  03/06/2014  FINDINGS: Right Kidney:  Renal measurements: 9.9 x 3.8 x 4.3 cm = volume: 84 mL. Echogenicity within normal limits. No mass or hydronephrosis visualized.  Left Kidney:  Renal measurements: 9.3 x 4.5 x 4.1 cm = volume: 89 mL. Echogenicity within normal limits. No mass or hydronephrosis visualized.  Bladder:  Appears normal for degree of bladder distention.  Other:  None.  IMPRESSION: Unremarkable ultrasound of the kidneys and urinary bladder.  Electronically Signed   By: Davina Poke D.O.   On: 08/14/2021 12:40 US PELVIC COMPLETE W TRANSVAGINAL AND TORSION R/O CLINICAL DATA:  Pelvic pain for 1 day  EXAM: TRANSABDOMINAL AND TRANSVAGINAL ULTRASOUND OF PELVIS  DOPPLER ULTRASOUND OF OVARIES  TECHNIQUE: Both transabdominal and transvaginal ultrasound examinations of the pelvis were performed. Transabdominal technique was performed for global imaging of the pelvis including uterus, ovaries, adnexal regions, and pelvic cul-de-sac.  It was necessary to proceed with endovaginal exam following the transabdominal exam to visualize the ovaries. Color and duplex Doppler ultrasound was utilized to evaluate blood flow to  the ovaries.  COMPARISON:  05/29/2014  FINDINGS: Uterus  Measurements: 8.3 x 4.3 x 4.9 cm = volume: 91 mL. No fibroids or other mass visualized.  Endometrium  Thickness: 12 mm.  No focal abnormality visualized.  Right ovary  Measurements: 4.0 x 3.1 x 3.1 cm = volume: 20 mL. 1.9 cm nonvascular hypoechoic lesion with increased through transmission and peripheral blood flow may represent a corpus luteal cyst or possibly endometrioma. Additional simple follicle incidentally noted within the right ovary.  Left ovary  Measurements: 3.9 x 2.1 x 2.4 cm = volume: 10 mL. Normal appearance/no adnexal mass.  Pulsed Doppler evaluation of both ovaries demonstrates normal low-resistance arterial and venous waveforms.  Other findings  Small volume complex free fluid within the pelvis with internal echoes.  IMPRESSION: 1. No evidence of adnexal torsion. 2. Small volume complex free fluid within the pelvis with internal echoes, possibly representing blood products. Etiology is uncertain and could be sequela of a ruptured cyst. 3. Small complex right ovarian cyst measuring 1.9 cm, possibly corpus luteal cyst or endometrioma. Follow-up ultrasound in 6-12 weeks is recommended. This recommendation follows the consensus statement: Management of Asymptomatic Ovarian and Other Adnexal Cysts Imaged at [place]: Society of Radiologists in [place]. Radiology 2010; [phone].  Electronically Signed   By: Davina Poke D.O.   On: 08/14/2021 12:38   Assessment:   1. History of ovarian cyst   2. Family history  of breast cancer   3. Perimenopausal symptoms      Plan:   1. History of ovarian cyst - Still experiencing pelvic pain. Will f/u with repeat US US PELVIS (TRANSABDOMINAL ONLY); Future  2. Family history of breast cancer - Patient has had hereditary cancer testing and was negative. Encourage continued routine surveillance based on risk factors.    3. Perimenopausal symptoms - Patient with bothersome symptoms.  Patient with bothersome menopausal vasomotor symptoms. Discussed lifestyle interventions such as wearing light clothing, remaining in cool environments, having fan/air conditioner in the room, avoiding hot beverages etc.  Discussed other medical options such as Paxil, Effexor, Brisdelle, Clonidine,  or Neurontin.   Also discussed alternative therapies such as herbal remedies but cautioned that most of the products contained phytoestrogens (plant estrogens) in unregulated amounts which can have the same effects on the body as the pharmaceutical estrogen preparations. Also discussed new cytokine inhibitor medication Veozah (recently FDA approved).  Patient declines use of hormonal therapy due to family history.  months for reevaluation.  Patient notes she will try lifestyle factors for now, may also look into OTC herbal remedies.    Return to clinic for any scheduled appointments or for any gynecologic concerns as needed. Follow up sooner if symptoms persist or fail to improve.     A total of 37 minutes were spent during this encounter, including review of previous progress notes, recent imaging and labs, face-to-face with time with patient involving counseling and coordination of care, as well as documentation for current visit.

## 2021-11-06 ENCOUNTER — Ambulatory Visit: Payer: No Typology Code available for payment source | Admitting: Family

## 2021-11-06 ENCOUNTER — Other Ambulatory Visit: Payer: No Typology Code available for payment source

## 2021-11-10 ENCOUNTER — Other Ambulatory Visit: Payer: Self-pay | Admitting: Obstetrics and Gynecology

## 2021-11-10 ENCOUNTER — Ambulatory Visit (INDEPENDENT_AMBULATORY_CARE_PROVIDER_SITE_OTHER): Payer: No Typology Code available for payment source

## 2021-11-10 DIAGNOSIS — Z8742 Personal history of other diseases of the female genital tract: Secondary | ICD-10-CM

## 2021-11-10 DIAGNOSIS — N83201 Unspecified ovarian cyst, right side: Secondary | ICD-10-CM

## 2021-11-12 ENCOUNTER — Other Ambulatory Visit: Payer: Self-pay

## 2021-11-20 ENCOUNTER — Telehealth: Payer: Self-pay | Admitting: Obstetrics and Gynecology

## 2021-11-20 NOTE — Telephone Encounter (Signed)
Patient called and would like a call to explain the results of her [place]. Please advise.

## 2021-11-23 NOTE — Telephone Encounter (Signed)
Called patient and explained U/S results to her. She wants to know if you would like her to do another U/S in 6 months. She said that was in the IMPRESSION. I did not see anything in the impression about another U/S, I told her I would talk to you and get back with her soon. Please advise.  The ultrasound shows that the cyst on your right ovary is still present.  It appears to be approximately the same size so it has not grown. I know that you were concerned about hormones, but I believe that a short course of hormones (4-6 weeks) could help this particular type of cyst resolve. However if you are not having any further issues with pain, you could opt to do nothing at this time and perhaps over time may resolve on its own.  This particular type of cyst is not a significant risk to your overall health.

## 2021-11-23 NOTE — Telephone Encounter (Signed)
No that wasn't in the impression of either of her ultrasound readings. I did mention repeating in 6 weeks if she decided to do a trial of hormones to attempt to get rid of the cyst.

## 2021-11-27 ENCOUNTER — Other Ambulatory Visit: Payer: Self-pay

## 2021-11-27 ENCOUNTER — Telehealth: Payer: No Typology Code available for payment source | Admitting: Family Medicine

## 2021-11-27 DIAGNOSIS — J329 Chronic sinusitis, unspecified: Secondary | ICD-10-CM | POA: Diagnosis not present

## 2021-11-27 MED ORDER — AMOXICILLIN-POT CLAVULANATE 875-125 MG PO TABS
1.0000 | ORAL_TABLET | Freq: Two times a day (BID) | ORAL | 0 refills | Status: AC
  Filled 2021-11-27: qty 14, 7d supply, fill #0

## 2021-11-27 NOTE — Telephone Encounter (Signed)
Send patient a mychart message in regards to follow up.

## 2021-11-27 NOTE — Progress Notes (Signed)

## 2021-12-04 ENCOUNTER — Encounter: Payer: Self-pay | Admitting: Internal Medicine

## 2021-12-04 ENCOUNTER — Ambulatory Visit (INDEPENDENT_AMBULATORY_CARE_PROVIDER_SITE_OTHER): Payer: No Typology Code available for payment source | Admitting: Internal Medicine

## 2021-12-04 VITALS — BP 118/76 | HR 88 | Temp 97.9°F | Ht 66.0 in | Wt 135.0 lb

## 2021-12-04 DIAGNOSIS — F419 Anxiety disorder, unspecified: Secondary | ICD-10-CM

## 2021-12-04 DIAGNOSIS — R634 Abnormal weight loss: Secondary | ICD-10-CM | POA: Diagnosis not present

## 2021-12-04 DIAGNOSIS — N943 Premenstrual tension syndrome: Secondary | ICD-10-CM | POA: Diagnosis not present

## 2021-12-04 DIAGNOSIS — R944 Abnormal results of kidney function studies: Secondary | ICD-10-CM

## 2021-12-04 DIAGNOSIS — J0140 Acute pansinusitis, unspecified: Secondary | ICD-10-CM

## 2021-12-04 DIAGNOSIS — N83201 Unspecified ovarian cyst, right side: Secondary | ICD-10-CM

## 2021-12-04 NOTE — Progress Notes (Unsigned)
Subjective:  Patient ID: Stephanie Chambers, female    DOB: 06-Apr-1976  Age: 46 y.o. MRN: [id number]  CC: There were no encounter diagnoses.   HPI Stephanie Chambers presents for transfer of care Chief Complaint  Patient presents with   Establish Care    Transfer of Care   1) Acute sinusitis:  treated via e visit on august 11,  taking augmentin since )   started prednisone 20 mg daily currently day 3  for persistent ear pain.  Now improving .  2) GAD and weight loss :  anxiety has been worse ,  better since reducing Dr .  Malachi Bonds ,  has increased wellbutrin to 300 mg but losing   Outpatient Medications Prior to Visit  Medication Sig Dispense Refill   ALPRAZolam (XANAX) 0.25 MG tablet Take 1 tablet (0.25 mg total) by mouth at bedtime as needed for anxiety (will drowsiness.). 30 tablet 5   amoxicillin-clavulanate (AUGMENTIN) 875-125 MG tablet Take 1 tablet by mouth 2 (two) times daily for 7 days. 14 tablet 0   ASHWAGANDHA PO Take 1 capsule by mouth daily.     buPROPion (WELLBUTRIN SR) 150 MG 12 hr tablet TAKE 1 TABLET BY MOUTH TWO TIMES DAILY 120 tablet 6   ipratropium (ATROVENT) 0.03 % nasal spray Place 2 sprays into both nostrils every 12 (twelve) hours. 30 mL 0   loratadine (CLARITIN) 10 MG tablet Take 10 mg by mouth daily.     ondansetron (ZOFRAN-ODT) 4 MG disintegrating tablet Take 1 tablet (4 mg total) by mouth every 8 (eight) hours as needed for nausea or vomiting. 12 tablet 0   No facility-administered medications prior to visit.    Review of Systems;  Patient denies headache, fevers, malaise, unintentional weight loss, skin rash, eye pain, sinus congestion and sinus pain, sore throat, dysphagia,  hemoptysis , cough, dyspnea, wheezing, chest pain, palpitations, orthopnea, edema, abdominal pain, nausea, melena, diarrhea, constipation, flank pain, dysuria, hematuria, urinary  Frequency, nocturia, numbness, tingling, seizures,  Focal weakness, Loss of consciousness,  Tremor,  insomnia, depression, anxiety, and suicidal ideation.      Objective:  BP 118/76 (BP Location: Left Arm, Patient Position: Sitting, Cuff Size: Normal)   Pulse 88   Temp 97.9 F (36.6 C) (Oral)   Ht '5\' 6"'$  (1.676 m)   Wt 135 lb (61.2 kg)   SpO2 98%   BMI 21.79 kg/m   BP Readings from Last 3 Encounters:  12/04/21 118/76  11/03/21 111/62  09/17/21 100/74    Wt Readings from Last 3 Encounters:  12/04/21 135 lb (61.2 kg)  11/03/21 138 lb (62.6 kg)  09/17/21 139 lb 12.8 oz (63.4 kg)    General appearance: alert, cooperative and appears stated age Ears: normal TM's and external ear canals both ears Throat: lips, mucosa, and tongue normal; teeth and gums normal Neck: no adenopathy, no carotid bruit, supple, symmetrical, trachea midline and thyroid not enlarged, symmetric, no tenderness/mass/nodules Back: symmetric, no curvature. ROM normal. No CVA tenderness. Lungs: clear to auscultation bilaterally Heart: regular rate and rhythm, S1, S2 normal, no murmur, click, rub or gallop Abdomen: soft, non-tender; bowel sounds normal; no masses,  no organomegaly Pulses: 2+ and symmetric Skin: Skin color, texture, turgor normal. No rashes or lesions Lymph nodes: Cervical, supraclavicular, and axillary nodes normal.  Lab Results  Component Value Date   HGBA1C 5.1 06/03/2020    Lab Results  Component Value Date   CREATININE 1.08 (H) 08/14/2021   CREATININE 0.95 11/16/2017  CREATININE 0.95 07/15/2017    Lab Results  Component Value Date   WBC 7.2 08/14/2021   HGB 13.3 08/14/2021   HCT 39.6 08/14/2021   PLT 205 08/14/2021   GLUCOSE 116 (H) 08/14/2021   CHOL 198 06/03/2020   TRIG 57 06/03/2020   HDL 77 06/03/2020   LDLCALC 110 (H) 06/03/2020   ALT 11 08/14/2021   AST 17 08/14/2021   NA 139 08/14/2021   K 4.0 08/14/2021   CL 106 08/14/2021   CREATININE 1.08 (H) 08/14/2021   BUN 14 08/14/2021   CO2 25 08/14/2021   TSH 1.040 06/03/2020   HGBA1C 5.1 06/03/2020    US  RENAL  Result Date: 08/14/2021 CLINICAL DATA:  Flank pain EXAM: RENAL / URINARY TRACT ULTRASOUND COMPLETE COMPARISON:  03/06/2014 FINDINGS: Right Kidney: Renal measurements: 9.9 x 3.8 x 4.3 cm = volume: 84 mL. Echogenicity within normal limits. No mass or hydronephrosis visualized. Left Kidney: Renal measurements: 9.3 x 4.5 x 4.1 cm = volume: 89 mL. Echogenicity within normal limits. No mass or hydronephrosis visualized. Bladder: Appears normal for degree of bladder distention. Other: None. IMPRESSION: Unremarkable ultrasound of the kidneys and urinary bladder. Electronically Signed   By: Davina Poke D.O.   On: 08/14/2021 12:40   US PELVIC COMPLETE W TRANSVAGINAL AND TORSION R/O  Result Date: 08/14/2021 CLINICAL DATA:  Pelvic pain for 1 day EXAM: TRANSABDOMINAL AND TRANSVAGINAL ULTRASOUND OF PELVIS DOPPLER ULTRASOUND OF OVARIES TECHNIQUE: Both transabdominal and transvaginal ultrasound examinations of the pelvis were performed. Transabdominal technique was performed for global imaging of the pelvis including uterus, ovaries, adnexal regions, and pelvic cul-de-sac. It was necessary to proceed with endovaginal exam following the transabdominal exam to visualize the ovaries. Color and duplex Doppler ultrasound was utilized to evaluate blood flow to the ovaries. COMPARISON:  05/29/2014 FINDINGS: Uterus Measurements: 8.3 x 4.3 x 4.9 cm = volume: 91 mL. No fibroids or other mass visualized. Endometrium Thickness: 12 mm.  No focal abnormality visualized. Right ovary Measurements: 4.0 x 3.1 x 3.1 cm = volume: 20 mL. 1.9 cm nonvascular hypoechoic lesion with increased through transmission and peripheral blood flow may represent a corpus luteal cyst or possibly endometrioma. Additional simple follicle incidentally noted within the right ovary. Left ovary Measurements: 3.9 x 2.1 x 2.4 cm = volume: 10 mL. Normal appearance/no adnexal mass. Pulsed Doppler evaluation of both ovaries demonstrates normal low-resistance  arterial and venous waveforms. Other findings Small volume complex free fluid within the pelvis with internal echoes. IMPRESSION: 1. No evidence of adnexal torsion. 2. Small volume complex free fluid within the pelvis with internal echoes, possibly representing blood products. Etiology is uncertain and could be sequela of a ruptured cyst. 3. Small complex right ovarian cyst measuring 1.9 cm, possibly corpus luteal cyst or endometrioma. Follow-up ultrasound in 6-12 weeks is recommended. This recommendation follows the consensus statement: Management of Asymptomatic Ovarian and Other Adnexal Cysts Imaged at [place]: Society of Radiologists in [place]. Radiology 2010; [phone]. Electronically Signed   By: Davina Poke D.O.   On: 08/14/2021 12:38    Assessment & Plan:   Problem List Items Addressed This Visit   None   I spent a total of   minutes with this patient in a face to face visit on the date of this encounter reviewing the last office visit with me on        ,  most recent with patient's cardiologist in    ,  patient'ss diet and eating habits,  home blood pressure readings ,  most recent imaging study ,   and post visit ordering of testing and therapeutics.    Follow-up: No follow-ups on file.   Crecencio Mc, MD

## 2021-12-04 NOTE — Patient Instructions (Addendum)
Prednisone for  a total of  5 days  Daily use of Probiotics for  3 weeks advised to reduce risk of C dificile colitis.      Please start the Lexapro (escitalopram) at 1/2 tablet daily in the evening for the first few days to avoid nausea.  You can increase to a full tablet after 4 days if you havenot developed side effects of nausea.  If the lexapro interferes with your sleep, take it in the morning instead  Please return in  4 weeks ,  Or e mail me to let me know how it is helping your depression   Reduce wellbutrin back to 150 after 2 weeks     You might want to try using Relaxium for insomnia  (as seen on TV commercials) . It contains:  Melatonin 5 mg  Chamomile 25 mg Passionflower extract 75 mg GABA 100 mg Ashwaganda extract 125 mg Magnesium citrate, glycinate, oxide (100 mg)  L tryptophan 500 mg Valerest (proprietary  ingredient ; probably valeria root extract)

## 2021-12-05 LAB — CBC WITH DIFFERENTIAL/PLATELET
Absolute Monocytes: 290 cells/uL (ref 200–950)
Basophils Absolute: 20 cells/uL (ref 0–200)
Basophils Relative: 0.3 %
Eosinophils Absolute: 13 cells/uL — ABNORMAL LOW (ref 15–500)
Eosinophils Relative: 0.2 %
HCT: 38.1 % (ref 35.0–45.0)
Hemoglobin: 12.9 g/dL (ref 11.7–15.5)
Lymphs Abs: 977 cells/uL (ref 850–3900)
MCH: 32.7 pg (ref 27.0–33.0)
MCHC: 33.9 g/dL (ref 32.0–36.0)
MCV: 96.7 fL (ref 80.0–100.0)
MPV: 10.3 fL (ref 7.5–12.5)
Monocytes Relative: 4.4 %
Neutro Abs: 5300 cells/uL (ref 1500–7800)
Neutrophils Relative %: 80.3 %
Platelets: 241 10*3/uL (ref 140–400)
RBC: 3.94 10*6/uL (ref 3.80–5.10)
RDW: 12.1 % (ref 11.0–15.0)
Total Lymphocyte: 14.8 %
WBC: 6.6 10*3/uL (ref 3.8–10.8)

## 2021-12-05 LAB — COMPLETE METABOLIC PANEL WITH GFR
AG Ratio: 2.5 (calc) (ref 1.0–2.5)
ALT: 10 U/L (ref 6–29)
AST: 11 U/L (ref 10–35)
Albumin: 4.7 g/dL (ref 3.6–5.1)
Alkaline phosphatase (APISO): 48 U/L (ref 31–125)
BUN/Creatinine Ratio: 11 (calc) (ref 6–22)
BUN: 12 mg/dL (ref 7–25)
CO2: 22 mmol/L (ref 20–32)
Calcium: 9.7 mg/dL (ref 8.6–10.2)
Chloride: 106 mmol/L (ref 98–110)
Creat: 1.13 mg/dL — ABNORMAL HIGH (ref 0.50–0.99)
Globulin: 1.9 g/dL (calc) (ref 1.9–3.7)
Glucose, Bld: 80 mg/dL (ref 65–99)
Potassium: 4.1 mmol/L (ref 3.5–5.3)
Sodium: 140 mmol/L (ref 135–146)
Total Bilirubin: 0.3 mg/dL (ref 0.2–1.2)
Total Protein: 6.6 g/dL (ref 6.1–8.1)
eGFR: 61 mL/min/{1.73_m2} (ref >=60)

## 2021-12-05 LAB — TSH: TSH: 0.95 mIU/L

## 2021-12-06 NOTE — Assessment & Plan Note (Signed)
Vs perimenopause.  Adding lexapro starting at 5 mg for amelioratio nof hot flashes

## 2021-12-06 NOTE — Addendum Note (Signed)
Addended by: Crecencio Mc on: 12/06/2021 09:52 PM   Modules accepted: Orders

## 2021-12-06 NOTE — Assessment & Plan Note (Signed)
Suggested by recent CT scam/ Follow up ultrasound was done by Dr Marcelline Mates

## 2021-12-06 NOTE — Assessment & Plan Note (Signed)
counselling given on self management of medications.  Advised to start lexapro and reduce wellbutrin dose to prescribed dose of 150 mg daily

## 2021-12-06 NOTE — Assessment & Plan Note (Signed)
Agree with continuation of low dose prednsone for several more days to manage inflammation.  Does not need longer dose of abx.  Daily use of a probiotic advised for 3 weeks.

## 2021-12-08 ENCOUNTER — Other Ambulatory Visit: Payer: Self-pay | Admitting: Internal Medicine

## 2021-12-08 ENCOUNTER — Telehealth: Payer: Self-pay | Admitting: Internal Medicine

## 2021-12-08 ENCOUNTER — Other Ambulatory Visit: Payer: Self-pay

## 2021-12-08 MED ORDER — ESCITALOPRAM OXALATE 10 MG PO TABS
ORAL_TABLET | ORAL | 1 refills | Status: DC
  Filled 2021-12-08: qty 90, 90d supply, fill #0
  Filled 2022-03-18: qty 90, 90d supply, fill #1

## 2021-12-08 NOTE — Telephone Encounter (Signed)
Lexapro was not sent to pharmacy

## 2021-12-08 NOTE — Telephone Encounter (Signed)
Pt called stating the medication lexapro is not at the pharmacy

## 2021-12-09 ENCOUNTER — Other Ambulatory Visit: Payer: Self-pay

## 2021-12-10 NOTE — Telephone Encounter (Signed)
Pt is aware medication has been sent.

## 2022-01-14 ENCOUNTER — Other Ambulatory Visit: Payer: Self-pay

## 2022-01-26 ENCOUNTER — Encounter: Payer: Self-pay | Admitting: Internal Medicine

## 2022-01-26 ENCOUNTER — Telehealth: Payer: Self-pay | Admitting: Internal Medicine

## 2022-01-26 ENCOUNTER — Telehealth: Payer: Self-pay | Admitting: Oncology

## 2022-01-26 ENCOUNTER — Other Ambulatory Visit: Payer: Self-pay | Admitting: Oncology

## 2022-01-26 ENCOUNTER — Ambulatory Visit
Admission: RE | Admit: 2022-01-26 | Discharge: 2022-01-26 | Disposition: A | Discharge status: Home / Self Care | Payer: No Typology Code available for payment source | Source: Ambulatory Visit | Attending: Oncology | Admitting: Oncology

## 2022-01-26 DIAGNOSIS — M79605 Pain in left leg: Secondary | ICD-10-CM | POA: Diagnosis present

## 2022-01-26 NOTE — Telephone Encounter (Signed)
Pt called thinking she may have deep vein thrombosis. sent to access nurse

## 2022-01-26 NOTE — Telephone Encounter (Signed)
Patient with new onset left leg pain and following after extended flight.  Recommended left lower extremity ultrasound to the lab DVT.

## 2022-01-26 NOTE — Telephone Encounter (Signed)
See phone note & patient advised to be seen at ED or UC.

## 2022-01-26 NOTE — Telephone Encounter (Signed)
Pt returned call and she was advised per Dr. Lupita Dawn recommendation. Pt stated "Thank you, Bye".

## 2022-01-26 NOTE — Telephone Encounter (Signed)
Spoke with pt and stated that she has recently flown to [facility] While in [place] she noticed that behind her left knee was hurting and swollen, then before flying back home she took an 81 mg Aspirin, it seemed better for a few days but yesterday the pain and swelling came back. Pt stated that she is having some SOBr, no chest pain, no redness. Pt was advised that she needed to be evaluated in person either at [facility] in or at the ED. Pt asked that I send the message to Dr. Derrel Nip to see if she would just order an [place]. I explained to pt that I would send the message to her but that she is going to recommend that she be seen in person. No appts available in our office tomorrow. Spoke with Dr. Derrel Nip and advised that since she is having the SOBr she will need to be evaluated in person. Attempted to call pt back and was forwarded to voicemail.

## 2022-01-28 ENCOUNTER — Ambulatory Visit: Payer: No Typology Code available for payment source | Admitting: Internal Medicine

## 2022-02-08 ENCOUNTER — Ambulatory Visit (INDEPENDENT_AMBULATORY_CARE_PROVIDER_SITE_OTHER): Payer: No Typology Code available for payment source | Admitting: Internal Medicine

## 2022-02-08 ENCOUNTER — Other Ambulatory Visit: Payer: Self-pay

## 2022-02-08 ENCOUNTER — Encounter: Payer: Self-pay | Admitting: Internal Medicine

## 2022-02-08 DIAGNOSIS — R300 Dysuria: Secondary | ICD-10-CM | POA: Diagnosis not present

## 2022-02-08 DIAGNOSIS — R350 Frequency of micturition: Secondary | ICD-10-CM

## 2022-02-08 LAB — POCT URINALYSIS DIPSTICK
Bilirubin, UA: 2
Blood, UA: NEGATIVE
Glucose, UA: POSITIVE — AB
Ketones, UA: 5
Nitrite, UA: POSITIVE
Protein, UA: POSITIVE — AB
Spec Grav, UA: 1.025 (ref 1.010–1.025)
Urobilinogen, UA: 2 E.U./dL — AB
pH, UA: 5.5 (ref 5.0–8.0)

## 2022-02-08 MED ORDER — CIPROFLOXACIN HCL 500 MG PO TABS
500.0000 mg | ORAL_TABLET | Freq: Two times a day (BID) | ORAL | 0 refills | Status: AC
  Filled 2022-02-08: qty 10, 5d supply, fill #0

## 2022-02-08 NOTE — Progress Notes (Signed)
Patient ID: Stephanie Chambers, female   DOB: Jun 29, 1975, 46 y.o.   MRN: [id number]   Subjective:    Patient ID: Stephanie Chambers, female    DOB: [birth year], 46 y.o.   MRN: [id number]   Patient here for  Chief Complaint  Patient presents with   Urinary Tract Infection    Pt c/o pain,frequency,burning & low back pain ongoing since Saturday. Has been taking otc Azo, pt denies any hematuria.    Marland Kitchen   HPI Here as work in - concerns regarding UTi.  Reports symptoms started Saturday. Dysuria, increased pressure and pain.  No fever.  No nausea or vomiting.  Headache.  Started drinking cranberry juice.  Lower abdominal discomfort - some back.  No hematuria.  Took AZO yesterday.  Denies possibility of being pregnant.    Past Medical History:  Diagnosis Date   Anemia    Anxiety    Bulging of cervical intervertebral disc    Constipation    Depression    major   DUB (dysfunctional uterine bleeding)    Endometrial polyp 05/2012   hysteroscopy w/ d &C   Epigastric abdominal pain    Lump in female breast    Mastodynia    Menorrhagia    PONV (postoperative nausea and vomiting)    UTI (lower urinary tract infection)    Past Surgical History:  Procedure Laterality Date   BREAST BIOPSY Right 04/24/2018   Affirm Bx- X-clip,  FOCAL SCLEROSING ADENOSIS WITH ASSOCIATED CALCIFICATIONS   BREAST EXCISIONAL BIOPSY Right 10/11/2018   MILD FIBROCYSTIC CHANGES   DIAGNOSTIC LAPAROSCOPY  2004   DILATION AND CURETTAGE OF UTERUS     HYSTEROSCOPY     NEVUS EXCISION Left 10/12/2018   Procedure: NEVUS EXCISION X TWO NECK AND CHEST;  Surgeon: Rolm Bookbinder, MD;  Location: [facility];  Service: General;  Laterality: Left;   RADIOACTIVE SEED GUIDED EXCISIONAL BREAST BIOPSY Right 10/12/2018   Procedure: RADIOACTIVE SEED GUIDED EXCISIONAL RIGHT BREAST BIOPSY AND REMOVAL OF MOLE RIGHT BREAST;  Surgeon: Rolm Bookbinder, MD;  Location: [facility];  Service: General;  Laterality:  Right;   TONSILLECTOMY  1998   Family History  Problem Relation Age of Onset   Cancer Mother 40       breast with liver mets   Breast cancer Mother 26   Cancer Father 41       colon, lung, kidney   Heart disease Father    Colon cancer Father    Depression Maternal Grandmother    Cancer Maternal Grandmother        colon   Colon cancer Maternal Grandmother    Depression Paternal Grandmother    Heart disease Paternal Grandfather    Breast cancer Cousin 62   Hypertension Brother    Ovarian cancer Neg Hx    Social History   Socioeconomic History   Marital status: Married    Spouse name: Not on file   Number of children: Not on file   Years of education: Not on file   Highest education level: Not on file  Occupational History   Not on file  Tobacco Use   Smoking status: Former   Smokeless tobacco: Never  Vaping Use   Vaping Use: Never used  Substance and Sexual Activity   Alcohol use: Yes    Alcohol/week: 0.0 standard drinks of alcohol    Comment: occassional   Drug use: No   Sexual activity: Yes    Birth control/protection: None  Other Topics Concern   Not on file  Social History Narrative   Not on file   Social Determinants of Health   Financial Resource Strain: Not on file  Food Insecurity: Not on file  Transportation Needs: Not on file  Physical Activity: Insufficiently Active (10/06/2017)   Exercise Vital Sign    Days of Exercise per Week: 3 days    Minutes of Exercise per Session: 30 min  Stress: Not on file  Social Connections: Not on file     Review of Systems  Constitutional:  Negative for appetite change and fever.  Respiratory:  Negative for cough, chest tightness and shortness of breath.   Cardiovascular:  Negative for chest pain, palpitations and leg swelling.  Gastrointestinal:  Negative for diarrhea, nausea and vomiting.       Lower abdominal pressure.   Genitourinary:  Positive for dysuria. Negative for hematuria.  Musculoskeletal:   Negative for joint swelling and myalgias.  Skin:  Negative for color change and rash.  Neurological:  Positive for headaches. Negative for dizziness.  Psychiatric/Behavioral:  Negative for agitation and dysphoric mood.        Objective:     BP 110/60 (BP Location: Left Arm, Patient Position: Sitting, Cuff Size: Normal)   Pulse 71   Temp 98 F (36.7 C) (Oral)   Resp 14   Ht '5\' 6"'$  (1.676 m)   Wt 141 lb 9.6 oz (64.2 kg)   SpO2 98%   BMI 22.85 kg/m  Wt Readings from Last 3 Encounters:  02/08/22 141 lb 9.6 oz (64.2 kg)  12/04/21 135 lb (61.2 kg)  11/03/21 138 lb (62.6 kg)    Physical Exam Vitals reviewed.  Constitutional:      General: She is not in acute distress.    Appearance: Normal appearance.  HENT:     Head: Normocephalic and atraumatic.  Eyes:     General: No scleral icterus.       Right eye: No discharge.        Left eye: No discharge.     Conjunctiva/sclera: Conjunctivae normal.  Neck:     Thyroid: No thyromegaly.  Cardiovascular:     Rate and Rhythm: Normal rate and regular rhythm.  Pulmonary:     Effort: No respiratory distress.     Breath sounds: Normal breath sounds. No wheezing.  Abdominal:     General: Bowel sounds are normal.     Palpations: Abdomen is soft.     Tenderness: There is no abdominal tenderness.  Musculoskeletal:        General: No swelling or tenderness.     Cervical back: Neck supple. No tenderness.     Comments: No CVA tenderness  Lymphadenopathy:     Cervical: No cervical adenopathy.  Skin:    Findings: No erythema or rash.  Neurological:     Mental Status: She is alert.  Psychiatric:        Mood and Affect: Mood normal.        Behavior: Behavior normal.      Outpatient Encounter Medications as of 02/08/2022  Medication Sig   ALPRAZolam (XANAX) 0.25 MG tablet Take 1 tablet (0.25 mg total) by mouth at bedtime as needed for anxiety (will drowsiness.).   buPROPion (WELLBUTRIN SR) 150 MG 12 hr tablet TAKE 1 TABLET BY MOUTH  TWO TIMES DAILY   [EXPIRED] ciprofloxacin (CIPRO) 500 MG tablet Take 1 tablet (500 mg total) by mouth 2 (two) times daily for 5 days.   escitalopram (LEXAPRO)  10 MG tablet 1/2 tablet daily with food.  Increase to full tablet after one week   ipratropium (ATROVENT) 0.03 % nasal spray Place 2 sprays into both nostrils every 12 (twelve) hours.   loratadine (CLARITIN) 10 MG tablet Take 10 mg by mouth daily.   [DISCONTINUED] ASHWAGANDHA PO Take 1 capsule by mouth daily. (Patient not taking: Reported on 02/08/2022)   [DISCONTINUED] ondansetron (ZOFRAN-ODT) 4 MG disintegrating tablet Take 1 tablet (4 mg total) by mouth every 8 (eight) hours as needed for nausea or vomiting. (Patient not taking: Reported on 02/08/2022)   No facility-administered encounter medications on file as of 02/08/2022.     Lab Results  Component Value Date   WBC 6.6 12/04/2021   HGB 12.9 12/04/2021   HCT 38.1 12/04/2021   PLT 241 12/04/2021   GLUCOSE 80 12/04/2021   CHOL 198 06/03/2020   TRIG 57 06/03/2020   HDL 77 06/03/2020   LDLCALC 110 (H) 06/03/2020   ALT 10 12/04/2021   AST 11 12/04/2021   NA 140 12/04/2021   K 4.1 12/04/2021   CL 106 12/04/2021   CREATININE 1.13 (H) 12/04/2021   BUN 12 12/04/2021   CO2 22 12/04/2021   TSH 0.95 12/04/2021   HGBA1C 5.1 06/03/2020    US Venous Img Lower Unilateral Left  Result Date: 01/26/2022 CLINICAL DATA:  Left lower extremity pain and swelling EXAM: LEFT LOWER EXTREMITY VENOUS DOPPLER ULTRASOUND TECHNIQUE: Gray-scale sonography with compression, as well as color and duplex ultrasound, were performed to evaluate the deep venous system(s) from the level of the common femoral vein through the popliteal and proximal calf veins. COMPARISON:  None Available. FINDINGS: VENOUS Normal compressibility of the common femoral, superficial femoral, and popliteal veins, as well as the visualized calf veins. Visualized portions of profunda femoral vein and great saphenous vein  unremarkable. No filling defects to suggest DVT on grayscale or color Doppler imaging. Doppler waveforms show normal direction of venous flow, normal respiratory plasticity and response to augmentation. Limited views of the contralateral common femoral vein are unremarkable. OTHER Baker's cyst of the popliteal fossa with internal debris measuring 3.9 x 0.6 x 2.4 cm. Limitations: none IMPRESSION: 1. No evidence of left lower extremity DVT. 2. Complex Baker's cyst of the left popliteal fossa. Electronically Signed   By: Yetta Glassman M.D.   On: 01/26/2022 18:24       Assessment & Plan:   Problem List Items Addressed This Visit     Dysuria    Symptoms and urine appear to be c/w UTI.  Feels similar to her previous UTI.  Request treatment with cipro.  cipro bid x 5 days.  Discussed taking a probiotic daily while on abx and for two weeks after completing the abx.  Urine culture pending.  Will review once available.  Stay hydrated.  Follow.       Other Visit Diagnoses     Urinary frequency       Relevant Orders   POCT Urinalysis Dipstick (Completed)   Urine Culture (Completed)        Einar Pheasant, MD

## 2022-02-09 ENCOUNTER — Other Ambulatory Visit: Payer: Self-pay | Admitting: Internal Medicine

## 2022-02-09 ENCOUNTER — Other Ambulatory Visit (INDEPENDENT_AMBULATORY_CARE_PROVIDER_SITE_OTHER): Payer: No Typology Code available for payment source

## 2022-02-09 DIAGNOSIS — R300 Dysuria: Secondary | ICD-10-CM | POA: Diagnosis not present

## 2022-02-09 LAB — URINALYSIS, MICROSCOPIC ONLY

## 2022-02-09 NOTE — Progress Notes (Signed)
Order placed for urine micro.

## 2022-02-11 LAB — URINE CULTURE
MICRO NUMBER:: 14087034
SPECIMEN QUALITY:: ADEQUATE

## 2022-02-12 ENCOUNTER — Other Ambulatory Visit: Payer: Self-pay | Admitting: Internal Medicine

## 2022-02-12 ENCOUNTER — Telehealth: Payer: Self-pay

## 2022-02-12 DIAGNOSIS — R809 Proteinuria, unspecified: Secondary | ICD-10-CM

## 2022-02-12 DIAGNOSIS — R82998 Other abnormal findings in urine: Secondary | ICD-10-CM

## 2022-02-12 NOTE — Progress Notes (Signed)
Order placed for f/u labs.  

## 2022-02-12 NOTE — Telephone Encounter (Signed)
Pt scheduled her lab appt but would like to know if there is any recommendations or anything that she needs to do before having her urine rechecked.

## 2022-02-12 NOTE — Telephone Encounter (Signed)
Stay hydrated.  Avoid antiinflammatories.

## 2022-02-12 NOTE — Telephone Encounter (Signed)
Pt called back and I read the message to her and she wanted to know any recommendations

## 2022-02-12 NOTE — Telephone Encounter (Signed)
Pt is aware.  

## 2022-02-12 NOTE — Telephone Encounter (Signed)
-----  Message from Einar Pheasant, MD sent at 02/12/2022  5:13 AM EDT ----- Pt of Dr Derrel Nip - I saw for work in.  Please call and notify - urine culture does reveal an infection.  She was treated with cipro.  Please confirm doing better.  Also, let her know that her urine did reveal some calcium oxalate crystals.  (This can indicate possible kidney stone).  I would like to recheck her urinalysis and met b in 10-14 days.  I have placed order for labs.  Please schedule lab appt.

## 2022-02-12 NOTE — Telephone Encounter (Signed)
Attempted to call pt. No answer. Mailbox full.

## 2022-02-14 DIAGNOSIS — R300 Dysuria: Secondary | ICD-10-CM | POA: Insufficient documentation

## 2022-02-14 NOTE — Assessment & Plan Note (Signed)
Symptoms and urine appear to be c/w UTI.  Feels similar to her previous UTI.  Request treatment with cipro.  cipro bid x 5 days.  Discussed taking a probiotic daily while on abx and for two weeks after completing the abx.  Urine culture pending.  Will review once available.  Stay hydrated.  Follow.

## 2022-02-15 NOTE — Telephone Encounter (Signed)
Pt would like to know why she can not take antiinflammatories.

## 2022-02-15 NOTE — Telephone Encounter (Signed)
The last kidney function test had decreased some and I was recommending to limit the amount of antiinflammatories taking - can affect kidney function.

## 2022-02-15 NOTE — Telephone Encounter (Signed)
Spoke with pt and informed her of Dr. Bary Leriche message below. Pt gave a verbal understanding.

## 2022-02-17 DIAGNOSIS — M25469 Effusion, unspecified knee: Secondary | ICD-10-CM | POA: Insufficient documentation

## 2022-02-19 ENCOUNTER — Encounter: Payer: Self-pay | Admitting: Internal Medicine

## 2022-02-26 ENCOUNTER — Other Ambulatory Visit (INDEPENDENT_AMBULATORY_CARE_PROVIDER_SITE_OTHER): Payer: No Typology Code available for payment source

## 2022-02-26 DIAGNOSIS — R809 Proteinuria, unspecified: Secondary | ICD-10-CM

## 2022-02-26 DIAGNOSIS — R82998 Other abnormal findings in urine: Secondary | ICD-10-CM

## 2022-02-26 NOTE — Addendum Note (Signed)
Addended by: Neta Ehlers on: 02/26/2022 01:53 PM   Modules accepted: Orders

## 2022-02-27 LAB — URINALYSIS, ROUTINE W REFLEX MICROSCOPIC
Bilirubin, UA: NEGATIVE
Glucose, UA: NEGATIVE
Ketones, UA: NEGATIVE
Leukocytes,UA: NEGATIVE
Nitrite, UA: NEGATIVE
Protein,UA: NEGATIVE
RBC, UA: NEGATIVE
Specific Gravity, UA: 1.005 (ref 1.005–1.030)
Urobilinogen, Ur: 0.2 mg/dL (ref 0.2–1.0)
pH, UA: 6 (ref 5.0–7.5)

## 2022-02-27 LAB — BASIC METABOLIC PANEL
BUN/Creatinine Ratio: 11 (ref 9–23)
BUN: 11 mg/dL (ref 6–24)
CO2: 25 mmol/L (ref 20–29)
Calcium: 9.4 mg/dL (ref 8.7–10.2)
Chloride: 103 mmol/L (ref 96–106)
Creatinine, Ser: 0.98 mg/dL (ref 0.57–1.00)
Glucose: 84 mg/dL (ref 70–99)
Potassium: 3.7 mmol/L (ref 3.5–5.2)
Sodium: 140 mmol/L (ref 134–144)
eGFR: 72 mL/min/{1.73_m2} (ref >59)

## 2022-03-07 ENCOUNTER — Ambulatory Visit
Admission: EM | Admit: 2022-03-07 | Discharge: 2022-03-07 | Disposition: A | Discharge status: Home / Self Care | Payer: No Typology Code available for payment source | Attending: Emergency Medicine | Admitting: Emergency Medicine

## 2022-03-07 ENCOUNTER — Other Ambulatory Visit: Payer: Self-pay

## 2022-03-07 DIAGNOSIS — H9202 Otalgia, left ear: Secondary | ICD-10-CM | POA: Diagnosis not present

## 2022-03-07 DIAGNOSIS — J0100 Acute maxillary sinusitis, unspecified: Secondary | ICD-10-CM | POA: Diagnosis not present

## 2022-03-07 MED ORDER — AZITHROMYCIN 250 MG PO TABS
250.0000 mg | ORAL_TABLET | Freq: Every day | ORAL | 0 refills | Status: DC
  Filled 2022-03-07: qty 6, 5d supply, fill #0

## 2022-03-07 NOTE — ED Provider Notes (Signed)
[facility]    CSN: [id number] Arrival date & time: 03/07/22  1132      History   Chief Complaint Chief Complaint  Patient presents with   Otalgia    HPI Stephanie Chambers is a 46 y.o. female. Patient presents with 1 day history of left ear pain.  She also reports 5 day history of sinus pressure, postnasal drip, congestion.  No fever, rash, cough, shortness of breath, vomiting, diarrhea, or other symptoms.  Treatment at home with ibuprofen.    The history is provided by the patient and medical records.    Past Medical History:  Diagnosis Date   Anemia    Anxiety    Bulging of cervical intervertebral disc    Constipation    Depression    major   DUB (dysfunctional uterine bleeding)    Endometrial polyp 05/2012   hysteroscopy w/ d &C   Epigastric abdominal pain    Lump in female breast    Mastodynia    Menorrhagia    PONV (postoperative nausea and vomiting)    UTI (lower urinary tract infection)     Patient Active Problem List   Diagnosis Date Noted   Dysuria 02/14/2022   Right ovarian cyst 09/19/2021   Screening mammogram for breast cancer 05/14/2021   Non-recurrent acute serous otitis media of right ear 05/14/2021   Acute non-recurrent pansinusitis 05/14/2021   Depression    Anxiety 05/29/2018   Peptic ulcer disease 05/29/2018   Multiple joint pain 04/27/2017   Other fatigue 04/27/2017   Right upper quadrant pain 04/27/2017   Hair loss 04/27/2017   Family history of colon cancer in father 04/27/2017   Pain of right breast 02/12/2016   Family history of breast cancer in mother 02/12/2016   Cephalalgia 10/14/2015   PMS (premenstrual syndrome) 10/14/2015   History of anemia 04/19/2013    Past Surgical History:  Procedure Laterality Date   BREAST BIOPSY Right 04/24/2018   Affirm Bx- X-clip,  FOCAL SCLEROSING ADENOSIS WITH ASSOCIATED CALCIFICATIONS   BREAST EXCISIONAL BIOPSY Right 10/11/2018   MILD FIBROCYSTIC CHANGES   DIAGNOSTIC LAPAROSCOPY   2004   DILATION AND CURETTAGE OF UTERUS     HYSTEROSCOPY     NEVUS EXCISION Left 10/12/2018   Procedure: NEVUS EXCISION X TWO NECK AND CHEST;  Surgeon: Rolm Bookbinder, MD;  Location: [facility];  Service: General;  Laterality: Left;   RADIOACTIVE SEED GUIDED EXCISIONAL BREAST BIOPSY Right 10/12/2018   Procedure: RADIOACTIVE SEED GUIDED EXCISIONAL RIGHT BREAST BIOPSY AND REMOVAL OF MOLE RIGHT BREAST;  Surgeon: Rolm Bookbinder, MD;  Location: [facility];  Service: General;  Laterality: Right;   TONSILLECTOMY  1998    OB History     Gravida  1   Para  1   Term      Preterm      AB      Living         SAB      IAB      Ectopic      Multiple      Live Births           Obstetric Comments  1st Menstrual Cycle:  11 1st Pregnancy:  31          Home Medications    Prior to Admission medications   Medication Sig Start Date End Date Taking? Authorizing Provider  azithromycin (ZITHROMAX) 250 MG tablet Take 1 tablet (250 mg total) by mouth daily. Take  first 2 tablets together, then 1 every day until finished. 03/07/22  Yes Sharion Balloon, NP  ALPRAZolam Duanne Moron) 0.25 MG tablet Take 1 tablet (0.25 mg total) by mouth at bedtime as needed for anxiety (will drowsiness.). 09/17/21   Crecencio Mc, MD  buPROPion (WELLBUTRIN SR) 150 MG 12 hr tablet TAKE 1 TABLET BY MOUTH TWO TIMES DAILY 05/14/21 05/14/22  Flinchum, Kelby Aline, FNP  escitalopram (LEXAPRO) 10 MG tablet 1/2 tablet daily with food.  Increase to full tablet after one week 12/08/21   Crecencio Mc, MD  ipratropium (ATROVENT) 0.03 % nasal spray Place 2 sprays into both nostrils every 12 (twelve) hours. 05/08/21   Montine Circle, PA-C  loratadine (CLARITIN) 10 MG tablet Take 10 mg by mouth daily.    Joline Salt, RN    Family History Family History  Problem Relation Age of Onset   Cancer Mother 27       breast with liver mets   Breast cancer Mother 71   Cancer Father 65        colon, lung, kidney   Heart disease Father    Colon cancer Father    Depression Maternal Grandmother    Cancer Maternal Grandmother        colon   Colon cancer Maternal Grandmother    Depression Paternal Grandmother    Heart disease Paternal Grandfather    Breast cancer Cousin 60   Hypertension Brother    Ovarian cancer Neg Hx     Social History Social History   Tobacco Use   Smoking status: Former   Smokeless tobacco: Never  Scientific laboratory technician Use: Never used  Substance Use Topics   Alcohol use: Yes    Alcohol/week: 0.0 standard drinks of alcohol    Comment: occassional   Drug use: No     Allergies   Cefdinir, Almond meal, Cherry, and Peach [prunus persica]   Review of Systems Review of Systems  Constitutional:  Negative for chills and fever.  HENT:  Positive for congestion, ear pain, postnasal drip and sinus pressure. Negative for sore throat.   Respiratory:  Negative for cough and shortness of breath.   Cardiovascular:  Negative for chest pain and palpitations.  Gastrointestinal:  Negative for diarrhea and vomiting.  Skin:  Negative for rash.  All other systems reviewed and are negative.    Physical Exam Triage Vital Signs ED Triage Vitals [03/07/22 1233]  Enc Vitals Group     BP 123/68     Pulse Rate 61     Resp 18     Temp 98.2 F (36.8 C)     Temp src      SpO2 97 %     Weight      Height      Head Circumference      Peak Flow      Pain Score      Pain Loc      Pain Edu?      Excl. in [place]?    No data found.  Updated Vital Signs BP 123/68   Pulse 61   Temp 98.2 F (36.8 C)   Resp 18   Ht '5\' 6"'$  (1.676 m)   Wt 138 lb (62.6 kg)   SpO2 97%   BMI 22.27 kg/m   Visual Acuity Right Eye Distance:   Left Eye Distance:   Bilateral Distance:    Right Eye Near:   Left Eye Near:    Bilateral Near:  Physical Exam Vitals and nursing note reviewed.  Constitutional:      General: She is not in acute distress.    Appearance: Normal  appearance. She is well-developed. She is not ill-appearing.  HENT:     Head: Normocephalic and atraumatic.     Right Ear: Tympanic membrane and ear canal normal.     Left Ear: Tympanic membrane and ear canal normal.     Nose: Nose normal.     Mouth/Throat:     Mouth: Mucous membranes are moist.     Pharynx: Oropharynx is clear.  Cardiovascular:     Rate and Rhythm: Normal rate and regular rhythm.     Heart sounds: Normal heart sounds.  Pulmonary:     Effort: Pulmonary effort is normal. No respiratory distress.     Breath sounds: Normal breath sounds.  Musculoskeletal:     Cervical back: Neck supple.  Skin:    General: Skin is warm and dry.  Neurological:     Mental Status: She is alert.  Psychiatric:        Mood and Affect: Mood normal.        Behavior: Behavior normal.      UC Treatments / Results  Labs (all labs ordered are listed, but only abnormal results are displayed) Labs Reviewed - No data to display  EKG   Radiology No results found.  Procedures Procedures (including critical care time)  Medications Ordered in UC Medications - No data to display  Initial Impression / Assessment and Plan / UC Course  I have reviewed the triage vital signs and the nursing notes.  Pertinent labs & imaging results that were available during my care of the patient were reviewed by me and considered in my medical decision making (see chart for details).    Acute sinusitis, left otalgia.  Discussed continued treatment including Tylenol or ibuprofen as needed.  If not improving in 2 to 3 days, start Zithromax.  Education provided on sinusitis and ear pain.  Instructed patient to follow up with her PCP if her symptoms are not improving.  She agrees to plan of care.     Final Clinical Impressions(s) / UC Diagnoses   Final diagnoses:  Acute non-recurrent maxillary sinusitis  Acute otalgia, left     Discharge Instructions      Take the Zithromax as directed.  Follow up  with your primary care provider if your symptoms are not improving.        ED Prescriptions     Medication Sig Dispense Auth. Provider   azithromycin (ZITHROMAX) 250 MG tablet Take 1 tablet (250 mg total) by mouth daily. Take first 2 tablets together, then 1 every day until finished. 6 tablet Sharion Balloon, NP      PDMP not reviewed this encounter.   Sharion Balloon, NP 03/07/22 1313

## 2022-03-07 NOTE — Discharge Instructions (Addendum)
Take the Zithromax as directed.  Follow up with your primary care provider if your symptoms are not improving.    

## 2022-03-07 NOTE — ED Triage Notes (Signed)
Patient to Urgent Care with complaints of left sided ear pain that started Saturday morning. Describes a throbbing/ stabbing pain. Has been taking ibuprofen. Denies any drainage. Denies any fevers.   Reports that she has had some facial pain/ pressure x4-5 days.

## 2022-03-08 ENCOUNTER — Other Ambulatory Visit: Payer: Self-pay

## 2022-03-18 ENCOUNTER — Other Ambulatory Visit: Payer: Self-pay

## 2022-04-27 ENCOUNTER — Telehealth (INDEPENDENT_AMBULATORY_CARE_PROVIDER_SITE_OTHER): Payer: 59 | Admitting: Internal Medicine

## 2022-04-27 ENCOUNTER — Encounter: Payer: Self-pay | Admitting: Internal Medicine

## 2022-04-27 ENCOUNTER — Other Ambulatory Visit: Payer: Self-pay

## 2022-04-27 VITALS — Ht 66.0 in | Wt 138.0 lb

## 2022-04-27 DIAGNOSIS — M2550 Pain in unspecified joint: Secondary | ICD-10-CM | POA: Diagnosis not present

## 2022-04-27 DIAGNOSIS — J0100 Acute maxillary sinusitis, unspecified: Secondary | ICD-10-CM | POA: Diagnosis not present

## 2022-04-27 MED ORDER — PREDNISONE 10 MG PO TABS
ORAL_TABLET | ORAL | 0 refills | Status: DC
  Filled 2022-04-27: qty 21, 6d supply, fill #0

## 2022-04-27 MED ORDER — AMOXICILLIN-POT CLAVULANATE 875-125 MG PO TABS
1.0000 | ORAL_TABLET | Freq: Two times a day (BID) | ORAL | 0 refills | Status: DC
  Filled 2022-04-27: qty 14, 7d supply, fill #0

## 2022-04-27 MED ORDER — PROMETHAZINE HCL 12.5 MG PO TABS
12.5000 mg | ORAL_TABLET | Freq: Three times a day (TID) | ORAL | 0 refills | Status: DC | PRN
  Filled 2022-04-27: qty 20, 7d supply, fill #0

## 2022-04-27 MED ORDER — SCOPOLAMINE 1 MG/3DAYS TD PT72
1.0000 | MEDICATED_PATCH | TRANSDERMAL | 0 refills | Status: DC
  Filled 2022-04-27: qty 4, 12d supply, fill #0

## 2022-04-27 NOTE — Progress Notes (Unsigned)
Virtual Visit via [place]   Note   This format is felt to be most appropriate for this patient at this time.  All issues noted in this document were discussed and addressed.  No physical exam was performed (except for noted visual exam findings with Video Visits).   I connected with  Stephanie Chambers  on 04/27/22 at  4:00 PM EST by a video enabled telemedicine application or telephone and verified that I am speaking with the correct person using two identifiers. Location patient: home Location provider: work or home office Persons participating in the virtual visit: patient, provider  I discussed the limitations, risks, security and privacy concerns of performing an evaluation and management service by telephone and the availability of in person appointments. I also discussed with the patient that there may be a patient responsible charge related to this service. The patient expressed understanding and agreed to proceed.  Reason for visit: joint pain   HPI:    47 yr old female with no prior workup for inflammatory arthritis presents with joint pain involving both thumbs. And the PIPs and MCPs of both hands,  right worse than left.  The pain and swelling is sporadic and episodic   Treated for  klebsiella UTI in late October , for acute maxillary sinusitis in mid November  Sinus pain ,  right ear pain since Friday with purulent drainage   Seasickness:  going on a [place] cruise in early February for 7 days   ROS: See pertinent positives and negatives per HPI.  Past Medical History:  Diagnosis Date   Anemia    Anxiety    Bulging of cervical intervertebral disc    Constipation    Depression    major   DUB (dysfunctional uterine bleeding)    Endometrial polyp 05/2012   hysteroscopy w/ d &C   Epigastric abdominal pain    Lump in female breast    Mastodynia    Menorrhagia    PONV (postoperative nausea and vomiting)    UTI (lower urinary tract infection)     Past Surgical History:   Procedure Laterality Date   BREAST BIOPSY Right 04/24/2018   Affirm Bx- X-clip,  FOCAL SCLEROSING ADENOSIS WITH ASSOCIATED CALCIFICATIONS   BREAST EXCISIONAL BIOPSY Right 10/11/2018   MILD FIBROCYSTIC CHANGES   DIAGNOSTIC LAPAROSCOPY  2004   DILATION AND CURETTAGE OF UTERUS     HYSTEROSCOPY     NEVUS EXCISION Left 10/12/2018   Procedure: NEVUS EXCISION X TWO NECK AND CHEST;  Surgeon: Rolm Bookbinder, MD;  Location: [facility];  Service: General;  Laterality: Left;   RADIOACTIVE SEED GUIDED EXCISIONAL BREAST BIOPSY Right 10/12/2018   Procedure: RADIOACTIVE SEED GUIDED EXCISIONAL RIGHT BREAST BIOPSY AND REMOVAL OF MOLE RIGHT BREAST;  Surgeon: Rolm Bookbinder, MD;  Location: [facility];  Service: General;  Laterality: Right;   TONSILLECTOMY  1998    Family History  Problem Relation Age of Onset   Cancer Mother 60       breast with liver mets   Breast cancer Mother 60   Cancer Father 40       colon, lung, kidney   Heart disease Father    Colon cancer Father    Depression Maternal Grandmother    Cancer Maternal Grandmother        colon   Colon cancer Maternal Grandmother    Depression Paternal Grandmother    Heart disease Paternal Grandfather    Breast cancer Cousin 71   Hypertension Brother  Ovarian cancer Neg Hx     SOCIAL HX: ***   Current Outpatient Medications:    ALPRAZolam (XANAX) 0.25 MG tablet, Take 1 tablet (0.25 mg total) by mouth at bedtime as needed for anxiety (will drowsiness.)., Disp: 30 tablet, Rfl: 5   azithromycin (ZITHROMAX) 250 MG tablet, Take 2 tablets by mouth on day 1, then 1 tablet daily for 4 days., Disp: 6 tablet, Rfl: 0   buPROPion (WELLBUTRIN SR) 150 MG 12 hr tablet, TAKE 1 TABLET BY MOUTH TWO TIMES DAILY, Disp: 120 tablet, Rfl: 6   escitalopram (LEXAPRO) 10 MG tablet, 1/2 tablet daily with food.  Increase to full tablet after one week, Disp: 90 tablet, Rfl: 1   ipratropium (ATROVENT) 0.03 % nasal spray, Place 2  sprays into both nostrils every 12 (twelve) hours., Disp: 30 mL, Rfl: 0   loratadine (CLARITIN) 10 MG tablet, Take 10 mg by mouth daily., Disp: , Rfl:   EXAM:  VITALS per patient if applicable:  GENERAL: alert, oriented, appears well and in no acute distress  HEENT: atraumatic, conjunttiva clear, no obvious abnormalities on inspection of external nose and ears  NECK: normal movements of the head and neck  LUNGS: on inspection no signs of respiratory distress, breathing rate appears normal, no obvious gross SOB, gasping or wheezing  CV: no obvious cyanosis  MS: moves all visible extremities without noticeable abnormality  PSYCH/NEURO: pleasant and cooperative, no obvious depression or anxiety, speech and thought processing grossly intact  ASSESSMENT AND PLAN: There are no diagnoses linked to this encounter.    I discussed the assessment and treatment plan with the patient. The patient was provided an opportunity to ask questions and all were answered. The patient agreed with the plan and demonstrated an understanding of the instructions.   The patient was advised to call back or seek an in-person evaluation if the symptoms worsen or if the condition fails to improve as anticipated.   I spent 30 minutes dedicated to the care of this patient on the date of this encounter to include pre-visit review of his medical history,  Face-to-face time with the patient , and post visit ordering of testing and therapeutics.    Crecencio Mc, MD

## 2022-04-28 NOTE — Assessment & Plan Note (Signed)
Empiric treatment with augmentin and prednisone

## 2022-04-28 NOTE — Assessment & Plan Note (Addendum)
Workup for autoimmune causes of her hand pain given her family history

## 2022-05-27 ENCOUNTER — Other Ambulatory Visit: Payer: Self-pay

## 2022-05-27 DIAGNOSIS — M25462 Effusion, left knee: Secondary | ICD-10-CM | POA: Diagnosis not present

## 2022-05-27 DIAGNOSIS — M25562 Pain in left knee: Secondary | ICD-10-CM | POA: Diagnosis not present

## 2022-05-27 MED ORDER — MELOXICAM 7.5 MG PO TABS
7.5000 mg | ORAL_TABLET | Freq: Two times a day (BID) | ORAL | 0 refills | Status: DC
  Filled 2022-05-27: qty 28, 14d supply, fill #0

## 2022-05-28 ENCOUNTER — Other Ambulatory Visit: Payer: Self-pay

## 2022-05-28 ENCOUNTER — Other Ambulatory Visit: Payer: Self-pay | Admitting: Physician Assistant

## 2022-05-28 DIAGNOSIS — M25462 Effusion, left knee: Secondary | ICD-10-CM

## 2022-06-07 ENCOUNTER — Ambulatory Visit: Payer: No Typology Code available for payment source | Admitting: Internal Medicine

## 2022-06-11 ENCOUNTER — Other Ambulatory Visit: Payer: Self-pay

## 2022-06-30 ENCOUNTER — Other Ambulatory Visit: Payer: Self-pay | Admitting: Internal Medicine

## 2022-06-30 ENCOUNTER — Other Ambulatory Visit: Payer: Self-pay

## 2022-06-30 MED FILL — Escitalopram Oxalate Tab 10 MG (Base Equiv): ORAL | 90 days supply | Qty: 90 | Fill #0 | Status: AC

## 2022-06-30 MED FILL — Bupropion HCl Tab ER 12HR 150 MG: ORAL | 60 days supply | Qty: 120 | Fill #0 | Status: AC

## 2022-07-01 ENCOUNTER — Other Ambulatory Visit: Payer: Self-pay

## 2022-07-06 ENCOUNTER — Other Ambulatory Visit: Payer: Self-pay | Admitting: Internal Medicine

## 2022-07-06 ENCOUNTER — Other Ambulatory Visit: Payer: Self-pay

## 2022-07-06 DIAGNOSIS — F419 Anxiety disorder, unspecified: Secondary | ICD-10-CM

## 2022-07-06 MED ORDER — ALPRAZOLAM 0.25 MG PO TABS
0.2500 mg | ORAL_TABLET | Freq: Every evening | ORAL | 0 refills | Status: DC | PRN
  Filled 2022-07-06: qty 30, 30d supply, fill #0

## 2022-07-06 NOTE — Telephone Encounter (Signed)
Refilled: 09/17/2021 Last OV: 12/04/2021 Next OV: not scheduled

## 2022-07-12 ENCOUNTER — Encounter: Payer: Self-pay | Admitting: Family Medicine

## 2022-07-12 ENCOUNTER — Other Ambulatory Visit: Payer: Self-pay

## 2022-07-12 ENCOUNTER — Ambulatory Visit (INDEPENDENT_AMBULATORY_CARE_PROVIDER_SITE_OTHER): Payer: 59 | Admitting: Family Medicine

## 2022-07-12 VITALS — BP 112/70 | HR 77 | Temp 97.4°F | Ht 66.0 in | Wt 147.8 lb

## 2022-07-12 DIAGNOSIS — N3000 Acute cystitis without hematuria: Secondary | ICD-10-CM | POA: Diagnosis not present

## 2022-07-12 DIAGNOSIS — F419 Anxiety disorder, unspecified: Secondary | ICD-10-CM

## 2022-07-12 HISTORY — DX: Acute cystitis without hematuria: N30.00

## 2022-07-12 LAB — POC URINALSYSI DIPSTICK (AUTOMATED)
Bilirubin, UA: POSITIVE
Blood, UA: NEGATIVE
Glucose, UA: POSITIVE — AB
Ketones, UA: POSITIVE
Nitrite, UA: POSITIVE
Protein, UA: POSITIVE — AB
Spec Grav, UA: 1.025 (ref 1.010–1.025)
Urobilinogen, UA: >=8 E.U./dL — AB
pH, UA: 5 (ref 5.0–8.0)

## 2022-07-12 MED ORDER — NITROFURANTOIN MONOHYD MACRO 100 MG PO CAPS
100.0000 mg | ORAL_CAPSULE | Freq: Two times a day (BID) | ORAL | 0 refills | Status: DC
  Filled 2022-07-12: qty 14, 7d supply, fill #0

## 2022-07-12 MED ORDER — ESCITALOPRAM OXALATE 20 MG PO TABS
20.0000 mg | ORAL_TABLET | Freq: Every day | ORAL | 0 refills | Status: DC
  Filled 2022-07-12: qty 90, 90d supply, fill #0

## 2022-07-12 NOTE — Patient Instructions (Signed)
Nice to see you. Will start on Macrobid for UTI.  We will send your urine for culture and microscopy to confirm that there is an infection and also confirm that the antibiotic is correct. Will increase your Lexapro to 20 mg daily.  If you notice any of the side effects we discussed please let us know.

## 2022-07-12 NOTE — Assessment & Plan Note (Signed)
Symptoms and urinalysis concerning for UTI.  Urinalysis may be abnormal given that she is been on Azo.  We will start nitrofurantoin 100 mg twice daily for 7 days.  We will send urine for culture microscopy.  Discussed it may take a couple of days before her symptoms would improve.

## 2022-07-12 NOTE — Addendum Note (Signed)
Addended by: Jeralyn Bennett A on: 07/12/2022 01:08 PM   Modules accepted: Orders

## 2022-07-12 NOTE — Assessment & Plan Note (Addendum)
Chronic issue.  Patient with some increased anxiety symptoms.  Will increase her Lexapro to 20 mg daily and see if that is beneficial.  Discussed monitoring for weight gain, sleep issues, and sexual dysfunction and if those things occur she should let us know.  She will follow-up with her PCP in 6 weeks.

## 2022-07-12 NOTE — Addendum Note (Signed)
Addended by: Jeralyn Bennett A on: 07/12/2022 02:02 PM   Modules accepted: Orders

## 2022-07-12 NOTE — Progress Notes (Signed)
Tommi Rumps, MD Phone: [phone]  Stephanie Chambers is a 47 y.o. female who presents today for same-day visit.  Dysuria: Patient notes onset last Friday.  She has dysuria, urinary frequency, and urinary urgency.  She notes no hematuria, vaginal discharge, or abdominal pain.  She has been taking Azo.  Anxiety: Patient notes has been going through menopause and notes she has been on edge and very snappy.  She was previously started on Lexapro last fall and notes that helped significantly with her anxiety and her sleep with her menopause.  She notes mild depression.  No SI.  Social History   Tobacco Use  Smoking Status Former  Smokeless Tobacco Never    Current Outpatient Medications on File Prior to Visit  Medication Sig Dispense Refill   ALPRAZolam (XANAX) 0.25 MG tablet Take 1 tablet (0.25 mg total) by mouth at bedtime as needed for anxiety (will drowsiness.). 30 tablet 0   buPROPion (WELLBUTRIN SR) 150 MG 12 hr tablet Take 1 tablet (150 mg total) by mouth 2 (two) times daily. 120 tablet 0   meloxicam (MOBIC) 7.5 MG tablet Take 1 tablet (7.5 mg total) by mouth 2 (two) times daily with food. 28 tablet 0   ipratropium (ATROVENT) 0.03 % nasal spray Place 2 sprays into both nostrils every 12 (twelve) hours. (Patient not taking: Reported on 07/12/2022) 30 mL 0   No current facility-administered medications on file prior to visit.     ROS see history of present illness  Objective  Physical Exam Vitals:   07/12/22 1200  BP: 112/70  Pulse: 77  Temp: (!) 97.4 F (36.3 C)  SpO2: 97%    BP Readings from Last 3 Encounters:  07/12/22 112/70  03/07/22 123/68  02/08/22 110/60   Wt Readings from Last 3 Encounters:  07/12/22 147 lb 12.8 oz (67 kg)  04/27/22 138 lb (62.6 kg)  03/07/22 138 lb (62.6 kg)    Physical Exam Constitutional:      General: She is not in acute distress.    Appearance: She is not diaphoretic.  Cardiovascular:     Rate and Rhythm: Normal rate and  regular rhythm.     Heart sounds: Normal heart sounds.  Pulmonary:     Effort: Pulmonary effort is normal.  Abdominal:     General: Bowel sounds are normal. There is no distension.     Palpations: Abdomen is soft.     Tenderness: There is abdominal tenderness (Slight suprapubic discomfort on palpation).  Skin:    General: Skin is warm and dry.  Neurological:     Mental Status: She is alert.      Assessment/Plan: Please see individual problem list.  Acute cystitis without hematuria Assessment & Plan: Symptoms and urinalysis concerning for UTI.  Urinalysis may be abnormal given that she is been on Azo.  We will start nitrofurantoin 100 mg twice daily for 7 days.  We will send urine for culture microscopy.  Discussed it may take a couple of days before her symptoms would improve.  Orders: -     Nitrofurantoin Monohyd Macro; Take 1 capsule (100 mg total) by mouth 2 (two) times daily.  Dispense: 14 capsule; Refill: 0  Anxiety Assessment & Plan: Chronic issue.  Patient with some increased anxiety symptoms.  Will increase her Lexapro to 20 mg daily and see if that is beneficial.  Discussed monitoring for weight gain, sleep issues, and sexual dysfunction and if those things occur she should let us know.  She will  follow-up with her PCP in 6 weeks.  Orders: -     Escitalopram Oxalate; Take 1 tablet (20 mg total) by mouth daily.  Dispense: 90 tablet; Refill: 0    Return in about 6 weeks (around 08/23/2022) for With PCP for anxiety.   Tommi Rumps, MD [place]

## 2022-07-13 LAB — URINALYSIS, MICROSCOPIC ONLY

## 2022-07-15 LAB — URINE CULTURE
MICRO NUMBER:: 14736697
SPECIMEN QUALITY:: ADEQUATE

## 2022-07-27 ENCOUNTER — Other Ambulatory Visit: Payer: Self-pay

## 2022-07-27 DIAGNOSIS — J329 Chronic sinusitis, unspecified: Secondary | ICD-10-CM | POA: Diagnosis not present

## 2022-07-27 DIAGNOSIS — J309 Allergic rhinitis, unspecified: Secondary | ICD-10-CM | POA: Diagnosis not present

## 2022-07-27 DIAGNOSIS — R0981 Nasal congestion: Secondary | ICD-10-CM | POA: Diagnosis not present

## 2022-07-27 MED ORDER — FLUTICASONE PROPIONATE 50 MCG/ACT NA SUSP
2.0000 | Freq: Every day | NASAL | 11 refills | Status: DC
  Filled 2022-07-27: qty 16, 30d supply, fill #0
  Filled 2022-09-02: qty 16, 30d supply, fill #1
  Filled 2022-10-18: qty 16, 30d supply, fill #2
  Filled 2022-12-15: qty 16, 30d supply, fill #3

## 2022-07-27 MED ORDER — CETIRIZINE HCL 10 MG PO TABS: ORAL_TABLET | ORAL | 14 refills | Status: AC

## 2022-07-27 MED ORDER — CHLORPHENIRAMINE MALEATE 4 MG PO TABS: ORAL_TABLET | ORAL | 0 refills | Status: DC

## 2022-07-28 DIAGNOSIS — J301 Allergic rhinitis due to pollen: Secondary | ICD-10-CM | POA: Diagnosis not present

## 2022-07-29 ENCOUNTER — Other Ambulatory Visit: Payer: Self-pay

## 2022-07-29 ENCOUNTER — Other Ambulatory Visit: Payer: Self-pay | Admitting: Internal Medicine

## 2022-07-29 DIAGNOSIS — Z1231 Encounter for screening mammogram for malignant neoplasm of breast: Secondary | ICD-10-CM

## 2022-08-17 DIAGNOSIS — J309 Allergic rhinitis, unspecified: Secondary | ICD-10-CM | POA: Diagnosis not present

## 2022-08-17 DIAGNOSIS — J329 Chronic sinusitis, unspecified: Secondary | ICD-10-CM | POA: Diagnosis not present

## 2022-08-17 DIAGNOSIS — R0981 Nasal congestion: Secondary | ICD-10-CM | POA: Diagnosis not present

## 2022-08-19 ENCOUNTER — Ambulatory Visit
Admission: RE | Admit: 2022-08-19 | Discharge: 2022-08-19 | Disposition: A | Discharge status: Home / Self Care | Payer: 59 | Source: Ambulatory Visit | Attending: Internal Medicine | Admitting: Internal Medicine

## 2022-08-19 DIAGNOSIS — Z1231 Encounter for screening mammogram for malignant neoplasm of breast: Secondary | ICD-10-CM | POA: Insufficient documentation

## 2022-08-24 ENCOUNTER — Ambulatory Visit: Payer: 59 | Admitting: Internal Medicine

## 2022-09-08 ENCOUNTER — Ambulatory Visit (INDEPENDENT_AMBULATORY_CARE_PROVIDER_SITE_OTHER): Payer: 59 | Admitting: Internal Medicine

## 2022-09-08 ENCOUNTER — Other Ambulatory Visit: Payer: Self-pay

## 2022-09-08 ENCOUNTER — Encounter: Payer: Self-pay | Admitting: Internal Medicine

## 2022-09-08 VITALS — BP 110/76 | HR 60 | Temp 98.5°F | Ht 66.0 in | Wt 153.0 lb

## 2022-09-08 DIAGNOSIS — N943 Premenstrual tension syndrome: Secondary | ICD-10-CM

## 2022-09-08 DIAGNOSIS — Z87898 Personal history of other specified conditions: Secondary | ICD-10-CM

## 2022-09-08 DIAGNOSIS — F32A Depression, unspecified: Secondary | ICD-10-CM

## 2022-09-08 DIAGNOSIS — N83201 Unspecified ovarian cyst, right side: Secondary | ICD-10-CM | POA: Diagnosis not present

## 2022-09-08 DIAGNOSIS — R635 Abnormal weight gain: Secondary | ICD-10-CM

## 2022-09-08 DIAGNOSIS — F419 Anxiety disorder, unspecified: Secondary | ICD-10-CM

## 2022-09-08 DIAGNOSIS — M2550 Pain in unspecified joint: Secondary | ICD-10-CM

## 2022-09-08 MED ORDER — BUPROPION HCL ER (SR) 150 MG PO TB12
150.0000 mg | ORAL_TABLET | Freq: Two times a day (BID) | ORAL | 0 refills | Status: DC
  Filled 2022-09-08 – 2022-11-09 (×2): qty 120, 60d supply, fill #0

## 2022-09-08 MED ORDER — SCOPOLAMINE 1 MG/3DAYS TD PT72
1.0000 | MEDICATED_PATCH | TRANSDERMAL | 0 refills | Status: DC
  Filled 2022-09-08 – 2022-09-27 (×2): qty 4, 12d supply, fill #0

## 2022-09-08 NOTE — Progress Notes (Signed)
Subjective:  Patient ID: Stephanie Chambers, female    DOB: 03-Mar-1976  Age: 47 y.o. MRN: [id number]  CC: The primary encounter diagnosis was Weight gain. Diagnoses of Anxiety, Polyarthralgia, PMS (premenstrual syndrome), Right ovarian cyst, Depression, unspecified depression type, and History of motion sickness were also pertinent to this visit.   HPI Stephanie Chambers presents for  Chief Complaint  Patient presents with   Medical Management of Chronic Issues   1) GAD:   she has been  taking lexapro 10  mg  and welbutrin 150 mg .  She was seen by ES recently for increased irritability who advised her to increase dose of lexapro,; however she deferred the  increase lexapro to 20 mg due fear of weight gain.  She  Less irritable lately  thinks it's hormone mediated   2) joint pain:    left heel,  left lateral achilles tendon ,  ankle pain  occurs  inversion/eversion but not  with running .  Heel is most painful iin the morning . H/o plantar fasciitis    3)  Motion sickness:  flying to [place] soon .  Requesting  scopalamine patch. Worked well for her on her cruise      Outpatient Medications Prior to Visit  Medication Sig Dispense Refill   ALPRAZolam (XANAX) 0.25 MG tablet Take 1 tablet (0.25 mg total) by mouth at bedtime as needed for anxiety (will drowsiness.). 30 tablet 0   cetirizine (ZYRTEC ALLERGY) 10 MG tablet Take 1 pill once daily as needed 90 tablet 14   escitalopram (LEXAPRO) 20 MG tablet Take 1 tablet (20 mg total) by mouth daily. (Patient taking differently: Take 10 mg by mouth daily.) 90 tablet 0   fluticasone (FLONASE) 50 MCG/ACT nasal spray Place 2 sprays into both nostrils daily. 16 g 11   buPROPion (WELLBUTRIN SR) 150 MG 12 hr tablet Take 1 tablet (150 mg total) by mouth 2 (two) times daily. 120 tablet 0   chlorpheniramine (ALLER-CHLOR) 4 MG tablet take 1 every 8 hours as needed for allergy, recommend 1 po qam and zyrtec in PM for severe allergy. (Patient not taking:  Reported on 09/08/2022) 60 tablet 0   ipratropium (ATROVENT) 0.03 % nasal spray Place 2 sprays into both nostrils every 12 (twelve) hours. (Patient not taking: Reported on 07/12/2022) 30 mL 0   meloxicam (MOBIC) 7.5 MG tablet Take 1 tablet (7.5 mg total) by mouth 2 (two) times daily with food. (Patient not taking: Reported on 09/08/2022) 28 tablet 0   nitrofurantoin, macrocrystal-monohydrate, (MACROBID) 100 MG capsule Take 1 capsule (100 mg total) by mouth 2 (two) times daily. (Patient not taking: Reported on 09/08/2022) 14 capsule 0   No facility-administered medications prior to visit.    Review of Systems;  Patient denies headache, fevers, malaise, unintentional weight loss, skin rash, eye pain, sinus congestion and sinus pain, sore throat, dysphagia,  hemoptysis , cough, dyspnea, wheezing, chest pain, palpitations, orthopnea, edema, abdominal pain, nausea, melena, diarrhea, constipation, flank pain, dysuria, hematuria, urinary  Frequency, nocturia, numbness, tingling, seizures,  Focal weakness, Loss of consciousness,  Tremor, insomnia, depression,  and suicidal ideation.      Objective:  BP 110/76   Pulse 60   Temp 98.5 F (36.9 C) (Oral)   Ht 5\' 6"  (1.676 m)   Wt 153 lb (69.4 kg)   SpO2 97%   BMI 24.69 kg/m   BP Readings from Last 3 Encounters:  09/08/22 110/76  07/12/22 112/70  03/07/22 123/68  Wt Readings from Last 3 Encounters:  09/08/22 153 lb (69.4 kg)  07/12/22 147 lb 12.8 oz (67 kg)  04/27/22 138 lb (62.6 kg)    Physical Exam Vitals reviewed.  Constitutional:      General: She is not in acute distress.    Appearance: Normal appearance. She is normal weight. She is not ill-appearing, toxic-appearing or diaphoretic.  HENT:     Head: Normocephalic.  Eyes:     General: No scleral icterus.       Right eye: No discharge.        Left eye: No discharge.     Conjunctiva/sclera: Conjunctivae normal.  Cardiovascular:     Rate and Rhythm: Normal rate and regular  rhythm.     Heart sounds: Normal heart sounds.  Pulmonary:     Effort: Pulmonary effort is normal. No respiratory distress.     Breath sounds: Normal breath sounds.  Musculoskeletal:        General: Normal range of motion.  Skin:    General: Skin is warm and dry.  Neurological:     General: No focal deficit present.     Mental Status: She is alert and oriented to person, place, and time. Mental status is at baseline.  Psychiatric:        Mood and Affect: Mood normal.        Behavior: Behavior normal.        Thought Content: Thought content normal.        Judgment: Judgment normal.    Lab Results  Component Value Date   HGBA1C 5.1 06/03/2020    Lab Results  Component Value Date   CREATININE 0.98 02/26/2022   CREATININE 1.13 (H) 12/04/2021   CREATININE 1.08 (H) 08/14/2021    Lab Results  Component Value Date   WBC 6.6 12/04/2021   HGB 12.9 12/04/2021   HCT 38.1 12/04/2021   PLT 241 12/04/2021   GLUCOSE 84 02/26/2022   CHOL 198 06/03/2020   TRIG 57 06/03/2020   HDL 77 06/03/2020   LDLCALC 110 (H) 06/03/2020   ALT 10 12/04/2021   AST 11 12/04/2021   NA 140 02/26/2022   K 3.7 02/26/2022   CL 103 02/26/2022   CREATININE 0.98 02/26/2022   BUN 11 02/26/2022   CO2 25 02/26/2022   TSH 0.95 12/04/2021   HGBA1C 5.1 06/03/2020    MM 3D SCREENING MAMMOGRAM BILATERAL BREAST  Result Date: 08/23/2022 CLINICAL DATA:  Screening. EXAM: DIGITAL SCREENING BILATERAL MAMMOGRAM WITH TOMOSYNTHESIS AND CAD TECHNIQUE: Bilateral screening digital craniocaudal and mediolateral oblique mammograms were obtained. Bilateral screening digital breast tomosynthesis was performed. The images were evaluated with computer-aided detection. COMPARISON:  Previous exam(s). ACR Breast Density Category c: The breasts are heterogeneously dense, which may obscure small masses. FINDINGS: There are no findings suspicious for malignancy. IMPRESSION: No mammographic evidence of malignancy. A result letter of this  screening mammogram will be mailed directly to the patient. RECOMMENDATION: Screening mammogram in one year. (Code:SM-B-01Y) BI-RADS CATEGORY  1: Negative. Electronically Signed   By: Sande Brothers M.D.   On: 08/23/2022 09:14    Assessment & Plan:  .Weight gain -     Comprehensive metabolic panel; Future -     Lipid panel; Future -     TSH; Future  Anxiety Assessment & Plan: Chronic issue.  Patient deferred the increase in Lexapro to 20 mg daily  due to fear of weight gain.  She feels that her increased symptoms are intermittent and hormone related.  No panic attacks.   No changes to regimen today    Polyarthralgia Assessment & Plan: Workup for autoimmune causes of her hand pain given her family history was ordered from her video visit in January but was deferred by patient.  She is using meloxicam prn    PMS (premenstrual syndrome) Assessment & Plan: Vs perimenopause.  Continue lexapro 10 mg    Right ovarian cyst  Depression, unspecified depression type Assessment & Plan: Symptoms are well controlled on Wellbutrin once daily . No changes today    History of motion sickness Assessment & Plan: Refilling transdermal scopalamine   Other orders -     buPROPion HCl ER (SR); Take 1 tablet (150 mg total) by mouth 2 (two) times daily.  Dispense: 120 tablet; Refill: 0 -     Scopolamine; Place 1 patch (1.5 mg total) onto the skin every 3 (three) days.  Dispense: 4 patch; Refill: 0     Follow-up: No follow-ups on file.   Sherlene Shams, MD

## 2022-09-10 ENCOUNTER — Encounter: Payer: Self-pay | Admitting: Internal Medicine

## 2022-09-10 DIAGNOSIS — Z87898 Personal history of other specified conditions: Secondary | ICD-10-CM | POA: Insufficient documentation

## 2022-09-10 NOTE — Assessment & Plan Note (Signed)
Refilling transdermal scopalamine

## 2022-09-10 NOTE — Assessment & Plan Note (Signed)
Vs perimenopause.  Continue lexapro 10 mg

## 2022-09-10 NOTE — Assessment & Plan Note (Signed)
Workup for autoimmune causes of her hand pain given her family history was ordered from her video visit in January but was deferred by patient.  She is using meloxicam prn

## 2022-09-10 NOTE — Assessment & Plan Note (Signed)
Symptoms are well controlled on Wellbutrin once daily . No changes today

## 2022-09-10 NOTE — Assessment & Plan Note (Signed)
Chronic issue.  Patient deferred the increase in Lexapro to 20 mg daily  due to fear of weight gain.  She feels that her increased symptoms are intermittent and hormone related. No panic attacks.   No changes to regimen today

## 2022-09-20 ENCOUNTER — Other Ambulatory Visit: Payer: Self-pay

## 2022-09-21 ENCOUNTER — Telehealth: Payer: Self-pay | Admitting: Internal Medicine

## 2022-09-21 ENCOUNTER — Encounter: Payer: Self-pay | Admitting: Internal Medicine

## 2022-09-21 DIAGNOSIS — R300 Dysuria: Secondary | ICD-10-CM

## 2022-09-21 NOTE — Telephone Encounter (Signed)
Patient called requesting urinalysis with lab work on 09/22/22 to check for UTI.

## 2022-09-21 NOTE — Telephone Encounter (Signed)
Orders have been placed.

## 2022-09-21 NOTE — Telephone Encounter (Signed)
Lab has been added. 

## 2022-09-22 ENCOUNTER — Other Ambulatory Visit (INDEPENDENT_AMBULATORY_CARE_PROVIDER_SITE_OTHER): Payer: 59

## 2022-09-22 DIAGNOSIS — R300 Dysuria: Secondary | ICD-10-CM

## 2022-09-22 DIAGNOSIS — R635 Abnormal weight gain: Secondary | ICD-10-CM | POA: Diagnosis not present

## 2022-09-22 LAB — URINALYSIS, ROUTINE W REFLEX MICROSCOPIC
Bilirubin Urine: NEGATIVE
Hgb urine dipstick: NEGATIVE
Ketones, ur: NEGATIVE
Nitrite: NEGATIVE
Specific Gravity, Urine: 1.025 (ref 1.000–1.030)
Total Protein, Urine: NEGATIVE
Urine Glucose: NEGATIVE
Urobilinogen, UA: 0.2 (ref 0.0–1.0)
pH: 6 (ref 5.0–8.0)

## 2022-09-22 LAB — LIPID PANEL
Cholesterol: 210 mg/dL — ABNORMAL HIGH (ref 0–200)
HDL: 71 mg/dL (ref >39.00)
LDL Cholesterol: 123 mg/dL — ABNORMAL HIGH (ref 0–99)
NonHDL: 138.79
Total CHOL/HDL Ratio: 3
Triglycerides: 80 mg/dL (ref 0.0–149.0)
VLDL: 16 mg/dL (ref 0.0–40.0)

## 2022-09-22 LAB — COMPREHENSIVE METABOLIC PANEL
ALT: 16 U/L (ref 0–35)
AST: 19 U/L (ref 0–37)
Albumin: 4.4 g/dL (ref 3.5–5.2)
Alkaline Phosphatase: 44 U/L (ref 39–117)
BUN: 15 mg/dL (ref 6–23)
CO2: 26 mEq/L (ref 19–32)
Calcium: 9.6 mg/dL (ref 8.4–10.5)
Chloride: 106 mEq/L (ref 96–112)
Creatinine, Ser: 0.99 mg/dL (ref 0.40–1.20)
GFR: 67.98 mL/min (ref >60.00)
Glucose, Bld: 84 mg/dL (ref 70–99)
Potassium: 4.4 mEq/L (ref 3.5–5.1)
Sodium: 141 mEq/L (ref 135–145)
Total Bilirubin: 0.4 mg/dL (ref 0.2–1.2)
Total Protein: 6.7 g/dL (ref 6.0–8.3)

## 2022-09-22 LAB — TSH: TSH: 1.1 u[IU]/mL (ref 0.35–5.50)

## 2022-09-23 ENCOUNTER — Encounter: Payer: Self-pay | Admitting: Internal Medicine

## 2022-09-24 ENCOUNTER — Other Ambulatory Visit: Payer: Self-pay | Admitting: Internal Medicine

## 2022-09-24 LAB — URINE CULTURE
MICRO NUMBER:: 15044611
SPECIMEN QUALITY:: ADEQUATE

## 2022-09-24 MED ORDER — CIPROFLOXACIN HCL 250 MG PO TABS: 250.0000 mg | ORAL_TABLET | Freq: Two times a day (BID) | ORAL | 0 refills | Status: AC

## 2022-09-27 ENCOUNTER — Other Ambulatory Visit: Payer: Self-pay

## 2022-09-27 MED ORDER — CIPROFLOXACIN HCL 250 MG PO TABS
250.0000 mg | ORAL_TABLET | Freq: Two times a day (BID) | ORAL | 0 refills | Status: DC
  Filled 2022-09-27: qty 6, 3d supply, fill #0

## 2022-09-27 NOTE — Telephone Encounter (Signed)
Doesn't look like the labs to test for arthritis were done.

## 2022-10-11 ENCOUNTER — Other Ambulatory Visit (INDEPENDENT_AMBULATORY_CARE_PROVIDER_SITE_OTHER): Payer: 59

## 2022-10-11 DIAGNOSIS — M2550 Pain in unspecified joint: Secondary | ICD-10-CM | POA: Diagnosis not present

## 2022-10-11 DIAGNOSIS — M25562 Pain in left knee: Secondary | ICD-10-CM | POA: Diagnosis not present

## 2022-10-12 LAB — CBC WITH DIFFERENTIAL/PLATELET
Basophils Absolute: 0 10*3/uL (ref 0.0–0.1)
Basophils Relative: 0.9 % (ref 0.0–3.0)
Eosinophils Absolute: 0.1 10*3/uL (ref 0.0–0.7)
Eosinophils Relative: 2.7 % (ref 0.0–5.0)
HCT: 36.4 % (ref 36.0–46.0)
Hemoglobin: 12.2 g/dL (ref 12.0–15.0)
Lymphocytes Relative: 41.5 % (ref 12.0–46.0)
Lymphs Abs: 1.6 10*3/uL (ref 0.7–4.0)
MCHC: 33.5 g/dL (ref 30.0–36.0)
MCV: 97.3 fl (ref 78.0–100.0)
Monocytes Absolute: 0.3 10*3/uL (ref 0.1–1.0)
Monocytes Relative: 8.1 % (ref 3.0–12.0)
Neutro Abs: 1.8 10*3/uL (ref 1.4–7.7)
Neutrophils Relative %: 46.8 % (ref 43.0–77.0)
Platelets: 206 10*3/uL (ref 150.0–400.0)
RBC: 3.74 Mil/uL — ABNORMAL LOW (ref 3.87–5.11)
RDW: 12.5 % (ref 11.5–15.5)
WBC: 3.9 10*3/uL — ABNORMAL LOW (ref 4.0–10.5)

## 2022-10-12 LAB — C-REACTIVE PROTEIN: CRP: <1 mg/dL (ref 0.5–20.0)

## 2022-10-12 LAB — ANA W/REFLEX IF POSITIVE: Anti Nuclear Antibody (ANA): NEGATIVE

## 2022-10-12 LAB — URIC ACID: Uric Acid, Serum: 4.2 mg/dL (ref 2.4–7.0)

## 2022-10-12 LAB — SEDIMENTATION RATE: Sed Rate: 3 mm/hr (ref 0–20)

## 2022-10-12 LAB — HLA-B27 ANTIGEN: HLA-B27 Antigen: NEGATIVE

## 2022-10-13 LAB — CYCLIC CITRUL PEPTIDE ANTIBODY, IGG: Cyclic Citrullin Peptide Ab: <16 UNITS

## 2022-10-13 LAB — RHEUMATOID FACTOR: Rheumatoid fact SerPl-aCnc: <10 IU/mL (ref <14)

## 2022-10-18 ENCOUNTER — Other Ambulatory Visit: Payer: Self-pay

## 2022-11-09 ENCOUNTER — Other Ambulatory Visit: Payer: Self-pay

## 2022-11-24 ENCOUNTER — Ambulatory Visit (INDEPENDENT_AMBULATORY_CARE_PROVIDER_SITE_OTHER): Payer: 59

## 2022-11-24 ENCOUNTER — Ambulatory Visit: Payer: 59 | Admitting: Podiatry

## 2022-11-24 ENCOUNTER — Other Ambulatory Visit: Payer: Self-pay

## 2022-11-24 ENCOUNTER — Encounter: Payer: Self-pay | Admitting: Podiatry

## 2022-11-24 DIAGNOSIS — G5782 Other specified mononeuropathies of left lower limb: Secondary | ICD-10-CM | POA: Diagnosis not present

## 2022-11-24 DIAGNOSIS — M722 Plantar fascial fibromatosis: Secondary | ICD-10-CM

## 2022-11-24 DIAGNOSIS — M7662 Achilles tendinitis, left leg: Secondary | ICD-10-CM

## 2022-11-24 MED ORDER — TRIAMCINOLONE ACETONIDE 40 MG/ML IJ SUSP
40.0000 mg | Freq: Once | INTRAMUSCULAR | Status: AC
Start: 2022-11-24 — End: 2022-11-24
  Administered 2022-11-24: 40 mg

## 2022-11-24 MED ORDER — METHYLPREDNISOLONE 4 MG PO TBPK
ORAL_TABLET | ORAL | 0 refills | Status: AC
  Filled 2022-11-24: qty 21, 6d supply, fill #0

## 2022-11-24 MED ORDER — MELOXICAM 15 MG PO TABS
15.0000 mg | ORAL_TABLET | Freq: Every day | ORAL | 3 refills | Status: DC
  Filled 2022-11-24: qty 30, 30d supply, fill #0

## 2022-11-24 NOTE — Progress Notes (Signed)
Subjective:  Patient ID: NYEMIAH JOY, female    DOB: [birth year],  MRN: [id number] HPI Chief Complaint  Patient presents with   Foot Pain    Posterior/plantar heel and plantar forefoot left - aching x couple years, gotten worse with the back of the heel hurting, thinks she has a neuroma, tries to ice it   New Patient (Initial Visit)    47 y.o. female presents with the above complaint.   ROS: Denies fever chills nausea by muscle aches pains calf pain back pain chest pain shortness of breath.  Past Medical History:  Diagnosis Date   Acute cystitis without hematuria 07/12/2022   Anemia    Anxiety    Bulging of cervical intervertebral disc    Constipation    Depression    major   DUB (dysfunctional uterine bleeding)    Endometrial polyp 05/2012   hysteroscopy w/ d &C   Epigastric abdominal pain    Lump in female breast    Mastodynia    Menorrhagia    PONV (postoperative nausea and vomiting)    UTI (lower urinary tract infection)    Past Surgical History:  Procedure Laterality Date   BREAST BIOPSY Right 04/24/2018   Affirm Bx- X-clip,  FOCAL SCLEROSING ADENOSIS WITH ASSOCIATED CALCIFICATIONS   BREAST EXCISIONAL BIOPSY Right 10/11/2018   MILD FIBROCYSTIC CHANGES   DIAGNOSTIC LAPAROSCOPY  2004   DILATION AND CURETTAGE OF UTERUS     HYSTEROSCOPY     NEVUS EXCISION Left 10/12/2018   Procedure: NEVUS EXCISION X TWO NECK AND CHEST;  Surgeon: Emelia Loron, MD;  Location: [facility];  Service: General;  Laterality: Left;   RADIOACTIVE SEED GUIDED EXCISIONAL BREAST BIOPSY Right 10/12/2018   Procedure: RADIOACTIVE SEED GUIDED EXCISIONAL RIGHT BREAST BIOPSY AND REMOVAL OF MOLE RIGHT BREAST;  Surgeon: Emelia Loron, MD;  Location: [facility];  Service: General;  Laterality: Right;   TONSILLECTOMY  1998    Current Outpatient Medications:    meloxicam (MOBIC) 15 MG tablet, Take 1 tablet (15 mg total) by mouth daily., Disp: 30 tablet, Rfl:  3   methylPREDNISolone (MEDROL DOSEPAK) 4 MG TBPK tablet, Take 6 tablets (24 mg total) by mouth daily for 1 day, THEN 5 tablets (20 mg total) daily for 1 day, THEN 4 tablets (16 mg total) daily for 1 day, THEN 3 tablets (12 mg total) daily for 1 day, THEN 2 tablets (8 mg total) daily for 1 day, THEN 1 tablet (4 mg total) daily for 1 day., Disp: 21 tablet, Rfl: 0   ALPRAZolam (XANAX) 0.25 MG tablet, Take 1 tablet (0.25 mg total) by mouth at bedtime as needed for anxiety (will drowsiness.)., Disp: 30 tablet, Rfl: 0   buPROPion (WELLBUTRIN SR) 150 MG 12 hr tablet, Take 1 tablet (150 mg total) by mouth 2 (two) times daily., Disp: 120 tablet, Rfl: 0   cetirizine (ZYRTEC ALLERGY) 10 MG tablet, Take 1 pill once daily as needed, Disp: 90 tablet, Rfl: 14   ciprofloxacin (CIPRO) 250 MG tablet, Take 1 tablet (250 mg total) by mouth 2 (two) times daily., Disp: 6 tablet, Rfl: 0   escitalopram (LEXAPRO) 20 MG tablet, Take 1 tablet (20 mg total) by mouth daily. (Patient taking differently: Take 10 mg by mouth daily.), Disp: 90 tablet, Rfl: 0   fluticasone (FLONASE) 50 MCG/ACT nasal spray, Place 2 sprays into both nostrils daily., Disp: 16 g, Rfl: 11   scopolamine (TRANSDERM-SCOP) 1 MG/3DAYS, Place 1 patch (1.5 mg total) onto the  skin every 3 (three) days., Disp: 4 patch, Rfl: 0  Allergies  Allergen Reactions   Cefdinir Shortness Of Breath   Almond Mea Itching   Cherry Itching   Peach [Prunus Persica] Itching   Review of Systems Objective:  There were no vitals filed for this visit.  General: Well developed, nourished, in no acute distress, alert and oriented x3   Dermatological: Skin is warm, dry and supple bilateral. Nails x 10 are well maintained; remaining integument appears unremarkable at this time. There are no open sores, no preulcerative lesions, no rash or signs of infection present.  Vascular: Dorsalis Pedis artery and Posterior Tibial artery pedal pulses are 2/4 bilateral with immedate capillary  fill time. Pedal hair growth present. No varicosities and no lower extremity edema present bilateral.   Neruologic: Grossly intact via light touch bilateral. Vibratory intact via tuning fork bilateral. Protective threshold with Semmes Wienstein monofilament intact to all pedal sites bilateral. Patellar and Achilles deep tendon reflexes 2+ bilateral. No Babinski or clonus noted bilateral.   Musculoskeletal: No gross boney pedal deformities bilateral. No pain, crepitus, or limitation noted with foot and ankle range of motion bilateral. Muscular strength 5/5 in all groups tested bilateral.  She has pain to palpation medial calcaneal tubercle of the left heel.  She has some tenderness along the Achilles with a palpable bursa beneath the skin.  She also has a palpable neuroma third interspace left foot with a palpable Mulder's click.  Gait: Unassisted, Nonantalgic.    Radiographs:  Radiographs taken today demonstrate osseously mature individual with good bone stock.  She has soft tissue increase in density at the plantar fascia calcaneal insertion site of the left heel.  No spurring is noted posteriorly or inferiorly.  No metatarsal abnormalities identified.  Assessment & Plan:   Assessment: She has plantar fasciitis with some compensatory Achilles tendinitis left foot.  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   Plan: Planter fasciitis with injected with 20 mg Kenalog tomograms Marcaine.  I am dispensed plantar fascia brace started her on methylprednisolone to be followed by meloxicam.  Also injected the third interdigital space today with 10 mg of Kenalog.  And discussed appropriate shoe gear stretching exercise ice therapy and shoe gear modifications.      T. [place], [place]

## 2022-11-24 NOTE — Patient Instructions (Signed)

## 2022-12-03 ENCOUNTER — Other Ambulatory Visit: Payer: Self-pay

## 2022-12-08 ENCOUNTER — Encounter: Payer: Self-pay | Admitting: Internal Medicine

## 2022-12-09 ENCOUNTER — Other Ambulatory Visit: Payer: Self-pay

## 2022-12-09 MED ORDER — ONDANSETRON HCL 4 MG PO TABS
4.0000 mg | ORAL_TABLET | Freq: Three times a day (TID) | ORAL | 0 refills | Status: DC | PRN
  Filled 2022-12-09: qty 20, 7d supply, fill #0

## 2022-12-22 ENCOUNTER — Ambulatory Visit: Payer: 59 | Admitting: Podiatry

## 2022-12-22 ENCOUNTER — Other Ambulatory Visit: Payer: Self-pay | Admitting: Physician Assistant

## 2022-12-22 DIAGNOSIS — M25462 Effusion, left knee: Secondary | ICD-10-CM

## 2022-12-26 ENCOUNTER — Ambulatory Visit
Admission: RE | Admit: 2022-12-26 | Discharge: 2022-12-26 | Disposition: A | Discharge status: Home / Self Care | Payer: 59 | Source: Ambulatory Visit | Attending: Physician Assistant | Admitting: Physician Assistant

## 2022-12-26 DIAGNOSIS — M25462 Effusion, left knee: Secondary | ICD-10-CM | POA: Diagnosis not present

## 2022-12-26 DIAGNOSIS — M25562 Pain in left knee: Secondary | ICD-10-CM | POA: Diagnosis not present

## 2022-12-26 DIAGNOSIS — R609 Edema, unspecified: Secondary | ICD-10-CM | POA: Diagnosis not present

## 2023-01-19 ENCOUNTER — Telehealth: Payer: 59 | Admitting: Family Medicine

## 2023-01-19 ENCOUNTER — Other Ambulatory Visit: Payer: Self-pay

## 2023-01-19 ENCOUNTER — Encounter: Payer: Self-pay | Admitting: Family Medicine

## 2023-01-19 DIAGNOSIS — R0981 Nasal congestion: Secondary | ICD-10-CM

## 2023-01-19 MED ORDER — AMOXICILLIN-POT CLAVULANATE 875-125 MG PO TABS
1.0000 | ORAL_TABLET | Freq: Two times a day (BID) | ORAL | 0 refills | Status: DC
Start: 2023-01-19 — End: 2023-03-08
  Filled 2023-01-19: qty 14, 7d supply, fill #0

## 2023-01-19 MED ORDER — AZELASTINE HCL 0.1 % NA SOLN
2.0000 | Freq: Two times a day (BID) | NASAL | 0 refills | Status: DC
Start: 2023-01-19 — End: 2023-02-03
  Filled 2023-01-19: qty 30, 25d supply, fill #0

## 2023-01-19 NOTE — Progress Notes (Signed)
Virtual Visit via video Note  I connected with Stephanie Chambers today at  3:40 PM EDT by a video enabled telemedicine application or telephone and verified that I am speaking with the correct person using two identifiers. Location patient: truck, [place] Location provider: work Persons participating in the virtual visit: patient, provider  I discussed the limitations, risks, security and privacy concerns of performing an evaluation and management service by telephone and the availability of in person appointments. I also discussed with the patient that there may be a patient responsible charge related to this service. The patient expressed understanding and agreed to proceed.  Reason for visit: Same-day visit.  HPI: Sinus pressure: Patient notes onset of symptoms 2 days ago.  Has significant left-sided sinus pressure and pain into her ear.  Has nasal congestion.  She is not blowing anything out of her nose.  Notes postnasal drip.  No sore throat, cough, shortness of breath, or taste or smell disturbances.  No sneezing.  She does have a history of allergies and is on cetirizine and Flonase for this.  She has been taking Advil Cold and Sinus with no benefit for her current symptoms.  She notes no COVID exposures.  She has not tested for COVID.  She declines COVID testing for this issue.  She does report a history of recurrent sinus infections.  Notes she has not had a sinus infection since getting her allergies under better control earlier this year.   ROS: See pertinent positives and negatives per HPI.  Past Medical History:  Diagnosis Date   Acute cystitis without hematuria 07/12/2022   Anemia    Anxiety    Bulging of cervical intervertebral disc    Constipation    Depression    major   DUB (dysfunctional uterine bleeding)    Endometrial polyp 05/2012   hysteroscopy w/ d &C   Epigastric abdominal pain    Lump in female breast    Mastodynia    Menorrhagia    PONV (postoperative nausea  and vomiting)    UTI (lower urinary tract infection)     Past Surgical History:  Procedure Laterality Date   BREAST BIOPSY Right 04/24/2018   Affirm Bx- X-clip,  FOCAL SCLEROSING ADENOSIS WITH ASSOCIATED CALCIFICATIONS   BREAST EXCISIONAL BIOPSY Right 10/11/2018   MILD FIBROCYSTIC CHANGES   DIAGNOSTIC LAPAROSCOPY  2004   DILATION AND CURETTAGE OF UTERUS     HYSTEROSCOPY     NEVUS EXCISION Left 10/12/2018   Procedure: NEVUS EXCISION X TWO NECK AND CHEST;  Surgeon: Emelia Loron, MD;  Location: [facility];  Service: General;  Laterality: Left;   RADIOACTIVE SEED GUIDED EXCISIONAL BREAST BIOPSY Right 10/12/2018   Procedure: RADIOACTIVE SEED GUIDED EXCISIONAL RIGHT BREAST BIOPSY AND REMOVAL OF MOLE RIGHT BREAST;  Surgeon: Emelia Loron, MD;  Location: [facility];  Service: General;  Laterality: Right;   TONSILLECTOMY  1998    Family History  Problem Relation Age of Onset   Cancer Mother 52       breast with liver mets   Breast cancer Mother 67   Cancer Father 72       colon, lung, kidney   Heart disease Father    Colon cancer Father    Depression Maternal Grandmother    Cancer Maternal Grandmother        colon   Colon cancer Maternal Grandmother    Depression Paternal Grandmother    Heart disease Paternal Grandfather    Breast cancer  Cousin 62   Hypertension Brother    Ovarian cancer Neg Hx     SOCIAL HX: Former smoker   Current Outpatient Medications:    ALPRAZolam (XANAX) 0.25 MG tablet, Take 1 tablet (0.25 mg total) by mouth at bedtime as needed for anxiety (will drowsiness.)., Disp: 30 tablet, Rfl: 0   amoxicillin-clavulanate (AUGMENTIN) 875-125 MG tablet, Take 1 tablet by mouth 2 (two) times daily., Disp: 14 tablet, Rfl: 0   azelastine (ASTELIN) 0.1 % nasal spray, Place 2 sprays into both nostrils 2 (two) times daily. Use in each nostril as directed, Disp: 30 mL, Rfl: 0   buPROPion (WELLBUTRIN SR) 150 MG 12 hr tablet, Take 1 tablet  (150 mg total) by mouth 2 (two) times daily., Disp: 120 tablet, Rfl: 0   cetirizine (ZYRTEC ALLERGY) 10 MG tablet, Take 1 pill once daily as needed, Disp: 90 tablet, Rfl: 14   escitalopram (LEXAPRO) 20 MG tablet, Take 1 tablet (20 mg total) by mouth daily. (Patient taking differently: Take 10 mg by mouth daily.), Disp: 90 tablet, Rfl: 0   fluticasone (FLONASE) 50 MCG/ACT nasal spray, Place 2 sprays into both nostrils daily., Disp: 16 g, Rfl: 11   meloxicam (MOBIC) 15 MG tablet, Take 1 tablet (15 mg total) by mouth daily., Disp: 30 tablet, Rfl: 3   ondansetron (ZOFRAN) 4 MG tablet, Take 1 tablet (4 mg total) by mouth every 8 (eight) hours as needed for nausea or vomiting., Disp: 20 tablet, Rfl: 0   scopolamine (TRANSDERM-SCOP) 1 MG/3DAYS, Place 1 patch (1.5 mg total) onto the skin every 3 (three) days., Disp: 4 patch, Rfl: 0  EXAM:  VITALS per patient if applicable:  GENERAL: alert, oriented, appears well and in no acute distress  HEENT: atraumatic, conjunttiva clear, no obvious abnormalities on inspection of external nose and ears  NECK: normal movements of the head and neck  LUNGS: on inspection no signs of respiratory distress, breathing rate appears normal, no obvious gross SOB, gasping or wheezing  CV: no obvious cyanosis  MS: moves all visible extremities without noticeable abnormality  PSYCH/NEURO: pleasant and cooperative, no obvious depression or anxiety, speech and thought processing grossly intact  ASSESSMENT AND PLAN:  Discussed the following assessment and plan:  Problem List Items Addressed This Visit     Sinus congestion - Primary    Suspect viral illness as the cause of her symptoms though given her history of recurrent sinus infections this could be the early portion of a sinus infection.  Discussed adding an additional allergy nose spray to her current regimen.  We will start Astelin nasal spray 2 sprays each nostril twice daily.  Discussed if this is not beneficial  over the next day or 2 she could consider starting on the Augmentin to cover for sinusitis.  If not improving with that she will need to let us know.      Relevant Medications   azelastine (ASTELIN) 0.1 % nasal spray   amoxicillin-clavulanate (AUGMENTIN) 875-125 MG tablet    Return if symptoms worsen or fail to improve.   I discussed the assessment and treatment plan with the patient. The patient was provided an opportunity to ask questions and all were answered. The patient agreed with the plan and demonstrated an understanding of the instructions.   The patient was advised to call back or seek an in-person evaluation if the symptoms worsen or if the condition fails to improve as anticipated.   Marikay Alar, MD

## 2023-01-19 NOTE — Assessment & Plan Note (Signed)
Suspect viral illness as the cause of her symptoms though given her history of recurrent sinus infections this could be the early portion of a sinus infection.  Discussed adding an additional allergy nose spray to her current regimen.  We will start Astelin nasal spray 2 sprays each nostril twice daily.  Discussed if this is not beneficial over the next day or 2 she could consider starting on the Augmentin to cover for sinusitis.  If not improving with that she will need to let us know.

## 2023-02-03 ENCOUNTER — Other Ambulatory Visit: Payer: Self-pay

## 2023-02-03 ENCOUNTER — Other Ambulatory Visit: Payer: Self-pay | Admitting: Family Medicine

## 2023-02-03 ENCOUNTER — Other Ambulatory Visit (HOSPITAL_COMMUNITY): Payer: Self-pay

## 2023-02-03 DIAGNOSIS — R0981 Nasal congestion: Secondary | ICD-10-CM

## 2023-02-03 MED ORDER — AZELASTINE HCL 0.1 % NA SOLN
2.0000 | Freq: Two times a day (BID) | NASAL | 0 refills | Status: DC
  Filled 2023-02-03: qty 30, 50d supply, fill #0

## 2023-03-08 ENCOUNTER — Ambulatory Visit (INDEPENDENT_AMBULATORY_CARE_PROVIDER_SITE_OTHER): Payer: 59 | Admitting: Family

## 2023-03-08 ENCOUNTER — Ambulatory Visit: Payer: 59 | Admitting: Family

## 2023-03-08 ENCOUNTER — Encounter: Payer: Self-pay | Admitting: Family

## 2023-03-08 ENCOUNTER — Other Ambulatory Visit: Payer: Self-pay

## 2023-03-08 VITALS — BP 118/64 | HR 90 | Temp 97.9°F | Ht 66.0 in | Wt 158.2 lb

## 2023-03-08 DIAGNOSIS — R399 Unspecified symptoms and signs involving the genitourinary system: Secondary | ICD-10-CM | POA: Diagnosis not present

## 2023-03-08 DIAGNOSIS — R300 Dysuria: Secondary | ICD-10-CM

## 2023-03-08 DIAGNOSIS — F419 Anxiety disorder, unspecified: Secondary | ICD-10-CM

## 2023-03-08 LAB — POCT URINALYSIS DIPSTICK
Bilirubin, UA: NEGATIVE
Blood, UA: NEGATIVE
Glucose, UA: NEGATIVE
Ketones, UA: NEGATIVE
Nitrite, UA: POSITIVE
Protein, UA: NEGATIVE
Spec Grav, UA: <=1.005 — AB (ref 1.010–1.025)
Urobilinogen, UA: 0.2 U/dL
pH, UA: 6 (ref 5.0–8.0)

## 2023-03-08 LAB — URINALYSIS, ROUTINE W REFLEX MICROSCOPIC
Bilirubin Urine: NEGATIVE
Hgb urine dipstick: NEGATIVE
Ketones, ur: NEGATIVE
Nitrite: POSITIVE — AB
RBC / HPF: NONE SEEN (ref 0–?)
Specific Gravity, Urine: <=1.005 — AB (ref 1.000–1.030)
Total Protein, Urine: NEGATIVE
Urine Glucose: NEGATIVE
Urobilinogen, UA: 0.2 (ref 0.0–1.0)
pH: 6 (ref 5.0–8.0)

## 2023-03-08 MED ORDER — ALPRAZOLAM 0.25 MG PO TABS
0.2500 mg | ORAL_TABLET | Freq: Every evening | ORAL | 0 refills | Status: AC | PRN
  Filled 2023-03-08: qty 5, 5d supply, fill #0
  Filled 2023-03-08: qty 25, 25d supply, fill #0
  Filled 2023-03-08: qty 5, 5d supply, fill #0
  Filled 2023-03-08: qty 5, 5d supply, fill #1
  Filled 2023-03-08: qty 25, 25d supply, fill #0

## 2023-03-08 MED ORDER — AMOXICILLIN-POT CLAVULANATE 875-125 MG PO TABS
1.0000 | ORAL_TABLET | Freq: Two times a day (BID) | ORAL | 0 refills | Status: AC
Start: 2023-03-08 — End: 2023-03-15
  Filled 2023-03-08: qty 14, 7d supply, fill #0

## 2023-03-08 MED ORDER — ESCITALOPRAM OXALATE 5 MG PO TABS
5.0000 mg | ORAL_TABLET | Freq: Every day | ORAL | 1 refills | Status: DC
Start: 2023-03-08 — End: 2023-07-01
  Filled 2023-03-08: qty 90, 90d supply, fill #0

## 2023-03-08 NOTE — Patient Instructions (Signed)
As discussed, you may start d-mannose, cranberry supplement over-the-counter to prevent urinary tract infection  You may also start Augmentin Ensure to take probiotics while on antibiotics and also for 2 weeks after completion. This can either be by eating yogurt daily or taking a probiotic supplement over the counter such as Culturelle.It is important to re-colonize the gut with good bacteria and also to prevent any diarrheal infections associated with antibiotic use.   I provided you with a refill of Xanax.  Please resume Lexapro 5 mg daily.  Please let us know how you are doing

## 2023-03-08 NOTE — Assessment & Plan Note (Signed)
Urine dipstick positive nitrites, leukocytes.  Pending urinalysis, urine culture.  Based on symptoms advised to go ahead and start Augmentin with probiotics.  She politely declines return to urology due to history of recurrent UTI.  Encouraged d-mannose, cranberry supplement OTC

## 2023-03-08 NOTE — Progress Notes (Signed)
Assessment & Plan:  UTI symptoms -     Urinalysis, Routine w reflex microscopic -     POCT urinalysis dipstick -     Urine Culture -     Amoxicillin-Pot Clavulanate; Take 1 tablet by mouth 2 (two) times daily for 7 days.  Dispense: 14 tablet; Refill: 0  Anxiety Assessment & Plan: Suboptimal control.  Provided refill of Xanax 0.25 mg to use sparingly.  We also discussed propranolol and Atarax.  She may consider these in the future.  Advised to resume Lexapro 5 mg and to monitor for weight gain.  Will continue Wellbutrin 150 mg once daily.  Orders: -     Escitalopram Oxalate; Take 1 tablet (5 mg total) by mouth daily.  Dispense: 90 tablet; Refill: 1 -     ALPRAZolam; Take 1 tablet (0.25 mg total) by mouth at bedtime as needed for anxiety (will drowsiness.).  Dispense: 30 tablet; Refill: 0  Dysuria Assessment & Plan: Urine dipstick positive nitrites, leukocytes.  Pending urinalysis, urine culture.  Based on symptoms advised to go ahead and start Augmentin with probiotics.  She politely declines return to urology due to history of recurrent UTI.  Encouraged d-mannose, cranberry supplement OTC      Return precautions given.   Risks, benefits, and alternatives of the medications and treatment plan prescribed today were discussed, and patient expressed understanding.   Education regarding symptom management and diagnosis given to patient on AVS either electronically or printed.  No follow-ups on file.  Rennie Plowman, FNP  Subjective:    Patient ID: Stephanie Chambers, female    DOB: 14-Mar-1976, 47 y.o.   MRN: [id number]  CC: Stephanie Chambers is a 47 y.o. female who presents today for an acute visit.    HPI: Complains of dysuria x 4 days  She feels achy which is not unusual when she gets a high tract infection. No chills, N,v, hematuria  History of recurrent urinary tract infections.  Previously followed with urology in 2022.      Urine culture with e coli , 5 months ago,  50,000-100,00 Urinalysis negative white blood cells, red blood cells.  Dr Darrick Huntsman sent in Cipro  Treated with Augmentin 01/2023 for sinus congestion  No h/o ckd  She is able to take Augmentin without side effects  Complains of increased anxiety due to worry over her young daughter.  Requests refill of Xanax.  She uses very sparingly.  She previously stopped Lexapro 20 mg due to concern of her weight gain.  She is compliant Wellbutrin 150 mg once daily  Denies depression.  Allergies: Cefdinir, Laurita Quint, Cherry, and Peach [prunus persica] Current Outpatient Medications on File Prior to Visit  Medication Sig Dispense Refill   azelastine (ASTELIN) 0.1 % nasal spray Place 2 sprays into both nostrils 2 (two) times daily. Use in each nostril as directed 30 mL 0   buPROPion (WELLBUTRIN SR) 150 MG 12 hr tablet Take 1 tablet (150 mg total) by mouth 2 (two) times daily. 120 tablet 0   cetirizine (ZYRTEC ALLERGY) 10 MG tablet Take 1 pill once daily as needed 90 tablet 14   fluticasone (FLONASE) 50 MCG/ACT nasal spray Place 2 sprays into both nostrils daily. 16 g 11   No current facility-administered medications on file prior to visit.    Review of Systems  Constitutional:  Negative for chills and fever.  Respiratory:  Negative for cough.   Cardiovascular:  Negative for chest pain and palpitations.  Gastrointestinal:  Negative for nausea and vomiting.  Genitourinary:  Positive for dysuria. Negative for flank pain.  Psychiatric/Behavioral:  Negative for sleep disturbance and suicidal ideas. The patient is nervous/anxious.       Objective:    BP 118/64   Pulse 90   Temp 97.9 F (36.6 C) (Oral)   Ht 5\' 6"  (1.676 m)   Wt 158 lb 3.2 oz (71.8 kg)   SpO2 97%   BMI 25.53 kg/m   BP Readings from Last 3 Encounters:  03/08/23 118/64  09/08/22 110/76  07/12/22 112/70   Wt Readings from Last 3 Encounters:  03/08/23 158 lb 3.2 oz (71.8 kg)  09/08/22 153 lb (69.4 kg)  07/12/22 147 lb 12.8 oz  (67 kg)      03/08/2023   12:42 PM 01/19/2023    3:37 PM 09/08/2022    1:33 PM  Depression screen PHQ 2/9  Decreased Interest 0 0 0  Down, Depressed, Hopeless 0 0 0  PHQ - 2 Score 0 0 0  Altered sleeping 0 0 0  Tired, decreased energy 0 0 0  Change in appetite 0 0 1  Feeling bad or failure about yourself  0 0 0  Trouble concentrating 0 0 0  Moving slowly or fidgety/restless 0 0 0  Suicidal thoughts 0 0 0  PHQ-9 Score 0 0 1  Difficult doing work/chores Not difficult at all Not difficult at all Not difficult at all    Physical Exam Vitals reviewed.  Constitutional:      Appearance: She is well-developed.  Cardiovascular:     Rate and Rhythm: Normal rate and regular rhythm.     Pulses: Normal pulses.     Heart sounds: Normal heart sounds.  Pulmonary:     Effort: Pulmonary effort is normal.     Breath sounds: Normal breath sounds. No wheezing, rhonchi or rales.  Skin:    General: Skin is warm and dry.  Neurological:     Mental Status: She is alert.  Psychiatric:        Speech: Speech normal.        Behavior: Behavior normal.        Thought Content: Thought content normal.

## 2023-03-08 NOTE — Assessment & Plan Note (Signed)
Suboptimal control.  Provided refill of Xanax 0.25 mg to use sparingly.  We also discussed propranolol and Atarax.  She may consider these in the future.  Advised to resume Lexapro 5 mg and to monitor for weight gain.  Will continue Wellbutrin 150 mg once daily.

## 2023-03-09 LAB — URINE CULTURE
MICRO NUMBER:: 15750588
SPECIMEN QUALITY:: ADEQUATE

## 2023-04-04 ENCOUNTER — Other Ambulatory Visit: Payer: Self-pay

## 2023-04-04 ENCOUNTER — Ambulatory Visit: Payer: 59 | Admitting: Podiatry

## 2023-04-04 ENCOUNTER — Encounter: Payer: Self-pay | Admitting: Podiatry

## 2023-04-04 DIAGNOSIS — G5782 Other specified mononeuropathies of left lower limb: Secondary | ICD-10-CM | POA: Diagnosis not present

## 2023-04-04 DIAGNOSIS — M722 Plantar fascial fibromatosis: Secondary | ICD-10-CM

## 2023-04-04 MED ORDER — TRIAMCINOLONE ACETONIDE 40 MG/ML IJ SUSP
40.0000 mg | Freq: Once | INTRAMUSCULAR | Status: AC
Start: 2023-04-04 — End: 2023-04-04
  Administered 2023-04-04: 40 mg

## 2023-04-04 MED ORDER — METHYLPREDNISOLONE 4 MG PO TBPK
ORAL_TABLET | ORAL | 0 refills | Status: DC
  Filled 2023-04-04: qty 21, 6d supply, fill #0

## 2023-04-04 MED ORDER — MELOXICAM 15 MG PO TABS
15.0000 mg | ORAL_TABLET | Freq: Every day | ORAL | 3 refills | Status: DC
  Filled 2023-04-04: qty 30, 30d supply, fill #0

## 2023-04-04 NOTE — Progress Notes (Signed)
She presents today for follow-up of her neuroma third interdigital space of the left foot.  States that is exquisitely tender she says on that spot it is really sore but my heels are hurting me even worse.  She never took the oral medication last time she states.  Objective: Vital signs are stable alert oriented x 3.  Pulses are palpable.  She has pain on palpation medial calcaneal tubercles bilateral as well as the dorsal aspect of the tarsometatarsal joints at the level of the deep peroneal nerve.  This is exquisitely tender on palpation resulting in numbness across the dorsal and dorsal medial aspect of the foot.  Third interdigital space does demonstrate an area of hypopigmentation from the injection particularly the cortisone injection from last visit.  It is exquisitely tender on palpation more than likely this will have to be removed at some point.  Assessment: Plantar fasciitis bilateral.  Deep peroneal nerve neuritis left foot.  And neuroma third interspace left foot.  Plan: Injected bilateral heels today 20 mg Kenalog 5 mg Marcaine point maximal tenderness.  I wrote another prescription for Medrol Dosepak to be followed by meloxicam.

## 2023-04-18 ENCOUNTER — Other Ambulatory Visit: Payer: Self-pay

## 2023-04-18 ENCOUNTER — Other Ambulatory Visit: Payer: Self-pay | Admitting: Internal Medicine

## 2023-04-18 MED ORDER — BUPROPION HCL ER (SR) 150 MG PO TB12
150.0000 mg | ORAL_TABLET | Freq: Two times a day (BID) | ORAL | 0 refills | Status: DC
  Filled 2023-04-18: qty 120, 60d supply, fill #0

## 2023-04-22 ENCOUNTER — Encounter: Payer: Self-pay | Admitting: Podiatry

## 2023-05-02 ENCOUNTER — Other Ambulatory Visit: Payer: Self-pay | Admitting: Family

## 2023-05-02 ENCOUNTER — Other Ambulatory Visit: Payer: Self-pay

## 2023-05-02 DIAGNOSIS — F419 Anxiety disorder, unspecified: Secondary | ICD-10-CM

## 2023-05-05 ENCOUNTER — Other Ambulatory Visit: Payer: Self-pay

## 2023-05-11 ENCOUNTER — Ambulatory Visit: Payer: 59 | Admitting: Podiatry

## 2023-06-07 ENCOUNTER — Telehealth: Payer: Self-pay | Admitting: Internal Medicine

## 2023-06-07 NOTE — Telephone Encounter (Signed)
Left message appt on 06/08/2023 needs to be rescheduled do to winter weather. E2C2 please schedule to next available physical slot.

## 2023-06-08 ENCOUNTER — Ambulatory Visit: Payer: 59 | Admitting: Podiatry

## 2023-06-08 ENCOUNTER — Encounter: Payer: Self-pay | Admitting: Internal Medicine

## 2023-06-28 ENCOUNTER — Telehealth

## 2023-06-28 ENCOUNTER — Other Ambulatory Visit: Payer: Self-pay

## 2023-06-28 DIAGNOSIS — B9689 Other specified bacterial agents as the cause of diseases classified elsewhere: Secondary | ICD-10-CM | POA: Diagnosis not present

## 2023-06-28 DIAGNOSIS — J019 Acute sinusitis, unspecified: Secondary | ICD-10-CM

## 2023-06-28 MED ORDER — DOXYCYCLINE HYCLATE 100 MG PO TABS
100.0000 mg | ORAL_TABLET | Freq: Two times a day (BID) | ORAL | 0 refills | Status: DC
  Filled 2023-06-28: qty 20, 10d supply, fill #0

## 2023-06-28 MED ORDER — FLUTICASONE PROPIONATE 50 MCG/ACT NA SUSP
2.0000 | Freq: Every day | NASAL | 0 refills | Status: AC
  Filled 2023-06-28: qty 16, 30d supply, fill #0

## 2023-06-28 MED ORDER — AZELASTINE HCL 0.1 % NA SOLN
1.0000 | Freq: Two times a day (BID) | NASAL | 0 refills | Status: AC
  Filled 2023-06-28: qty 30, 90d supply, fill #0

## 2023-06-28 MED ORDER — ONDANSETRON 4 MG PO TBDP
4.0000 mg | ORAL_TABLET | Freq: Three times a day (TID) | ORAL | 0 refills | Status: AC | PRN
  Filled 2023-06-28: qty 20, 4d supply, fill #0

## 2023-06-28 NOTE — Patient Instructions (Signed)
 Stephanie Chambers, thank you for joining Margaretann Loveless, PA-C for today's virtual visit.  While this provider is not your primary care provider (PCP), if your PCP is located in our provider database this encounter information will be shared with them immediately following your visit.   A [facility] MyChart account gives you access to today's visit and all your visits, tests, and labs performed at [facility] " click here if you don't have a [facility] MyChart account or go to [email or web address]  Consent: (Patient) Stephanie Chambers provided verbal consent for this virtual visit at the beginning of the encounter.  Current Medications:  Current Outpatient Medications:    azelastine (ASTELIN) 0.1 % nasal spray, Place 1 spray into both nostrils 2 (two) times daily. Use in each nostril as directed, Disp: 30 mL, Rfl: 0   doxycycline (VIBRA-TABS) 100 MG tablet, Take 1 tablet (100 mg total) by mouth 2 (two) times daily., Disp: 20 tablet, Rfl: 0   fluticasone (FLONASE) 50 MCG/ACT nasal spray, Place 2 sprays into both nostrils daily., Disp: 16 g, Rfl: 0   ondansetron (ZOFRAN-ODT) 4 MG disintegrating tablet, Take 1-2 tablets (4-8 mg total) by mouth every 8 (eight) hours as needed., Disp: 20 tablet, Rfl: 0   ALPRAZolam (XANAX) 0.25 MG tablet, Take 1 tablet (0.25 mg total) by mouth at bedtime as needed for anxiety (will drowsiness.)., Disp: 30 tablet, Rfl: 0   buPROPion (WELLBUTRIN SR) 150 MG 12 hr tablet, Take 1 tablet (150 mg total) by mouth 2 (two) times daily., Disp: 120 tablet, Rfl: 0   cetirizine (ZYRTEC ALLERGY) 10 MG tablet, Take 1 pill once daily as needed, Disp: 90 tablet, Rfl: 14   escitalopram (LEXAPRO) 5 MG tablet, Take 1 tablet (5 mg total) by mouth daily., Disp: 90 tablet, Rfl: 1   meloxicam (MOBIC) 15 MG tablet, Take 1 tablet (15 mg total) by mouth daily., Disp: 30 tablet, Rfl: 3   methylPREDNISolone (MEDROL DOSEPAK) 4 MG TBPK tablet, Take as directed for 6 days.  Instructions on package,, Disp: 21 tablet, Rfl: 0   Medications ordered in this encounter:  Meds ordered this encounter  Medications   azelastine (ASTELIN) 0.1 % nasal spray    Sig: Place 1 spray into both nostrils 2 (two) times daily. Use in each nostril as directed    Dispense:  30 mL    Refill:  0    Supervising Provider:   Merrilee Jansky [[id number]]   fluticasone (FLONASE) 50 MCG/ACT nasal spray    Sig: Place 2 sprays into both nostrils daily.    Dispense:  16 g    Refill:  0    Supervising Provider:   LAMPTEY, PHILIP O [[id number]]   ondansetron (ZOFRAN-ODT) 4 MG disintegrating tablet    Sig: Take 1-2 tablets (4-8 mg total) by mouth every 8 (eight) hours as needed.    Dispense:  20 tablet    Refill:  0    Supervising Provider:   Merrilee Jansky [[id number]]   doxycycline (VIBRA-TABS) 100 MG tablet    Sig: Take 1 tablet (100 mg total) by mouth 2 (two) times daily.    Dispense:  20 tablet    Refill:  0    Supervising Provider:   Merrilee Jansky [[id number]]     *If you need refills on other medications prior to your next appointment, please contact your pharmacy*  Follow-Up: Call back or seek an in-person evaluation if the symptoms worsen or if the condition fails to  improve as anticipated.  [facility] [phone]  Other Instructions Sinus Infection, Adult A sinus infection, also called sinusitis, is inflammation of your sinuses. Sinuses are hollow spaces in the bones around your face. Your sinuses are located: Around your eyes. In the middle of your forehead. Behind your nose. In your cheekbones. Mucus normally drains out of your sinuses. When your nasal tissues become inflamed or swollen, mucus can become trapped or blocked. This allows bacteria, viruses, and fungi to grow, which leads to infection. Most infections of the sinuses are caused by a virus. A sinus infection can develop quickly. It can last for up to 4 weeks (acute) or for more than 12 weeks  (chronic). A sinus infection often develops after a cold. What are the causes? This condition is caused by anything that creates swelling in the sinuses or stops mucus from draining. This includes: Allergies. Asthma. Infection from bacteria or viruses. Deformities or blockages in your nose or sinuses. Abnormal growths in the nose (nasal polyps). Pollutants, such as chemicals or irritants in the air. Infection from fungi. This is rare. What increases the risk? You are more likely to develop this condition if you: Have a weak body defense system (immune system). Do a lot of swimming or diving. Overuse nasal sprays. Smoke. What are the signs or symptoms? The main symptoms of this condition are pain and a feeling of pressure around the affected sinuses. Other symptoms include: Stuffy nose or congestion that makes it difficult to breathe through your nose. Thick yellow or greenish drainage from your nose. Tenderness, swelling, and warmth over the affected sinuses. A cough that may get worse at night. Decreased sense of smell and taste. Extra mucus that collects in the throat or the back of the nose (postnasal drip) causing a sore throat or bad breath. Tiredness (fatigue). Fever. How is this diagnosed? This condition is diagnosed based on: Your symptoms. Your medical history. A physical exam. Tests to find out if your condition is acute or chronic. This may include: Checking your nose for nasal polyps. Viewing your sinuses using a device that has a light (endoscope). Testing for allergies or bacteria. Imaging tests, such as an MRI or CT scan. In rare cases, a bone biopsy may be done to rule out more serious types of fungal sinus disease. How is this treated? Treatment for a sinus infection depends on the cause and whether your condition is chronic or acute. If caused by a virus, your symptoms should go away on their own within 10 days. You may be given medicines to relieve symptoms.  They include: Medicines that shrink swollen nasal passages (decongestants). A spray that eases inflammation of the nostrils (topical intranasal corticosteroids). Rinses that help get rid of thick mucus in your nose (nasal saline washes). Medicines that treat allergies (antihistamines). Over-the-counter pain relievers. If caused by bacteria, your health care provider may recommend waiting to see if your symptoms improve. Most bacterial infections will get better without antibiotic medicine. You may be given antibiotics if you have: A severe infection. A weak immune system. If caused by narrow nasal passages or nasal polyps, surgery may be needed. Follow these instructions at home: Medicines Take, use, or apply over-the-counter and prescription medicines only as told by your health care provider. These may include nasal sprays. If you were prescribed an antibiotic medicine, take it as told by your health care provider. Do not stop taking the antibiotic even if you start to feel better. Hydrate and humidify  Drink enough fluid to keep your urine pale yellow. Staying hydrated will help to thin your mucus. Use a cool mist humidifier to keep the humidity level in your home above 50%. Inhale steam for 10-15 minutes, 3-4 times a day, or as told by your health care provider. You can do this in the bathroom while a hot shower is running. Limit your exposure to cool or dry air. Rest Rest as much as possible. Sleep with your head raised (elevated). Make sure you get enough sleep each night. General instructions  Apply a warm, moist washcloth to your face 3-4 times a day or as told by your health care provider. This will help with discomfort. Use nasal saline washes as often as told by your health care provider. Wash your hands often with soap and water to reduce your exposure to germs. If soap and water are not available, use hand sanitizer. Do not smoke. Avoid being around people who are smoking  (secondhand smoke). Keep all follow-up visits. This is important. Contact a health care provider if: You have a fever. Your symptoms get worse. Your symptoms do not improve within 10 days. Get help right away if: You have a severe headache. You have persistent vomiting. You have severe pain or swelling around your face or eyes. You have vision problems. You develop confusion. Your neck is stiff. You have trouble breathing. These symptoms may be an emergency. Get help right away. Call 911. Do not wait to see if the symptoms will go away. Do not drive yourself to the hospital. Summary A sinus infection is soreness and inflammation of your sinuses. Sinuses are hollow spaces in the bones around your face. This condition is caused by nasal tissues that become inflamed or swollen. The swelling traps or blocks the flow of mucus. This allows bacteria, viruses, and fungi to grow, which leads to infection. If you were prescribed an antibiotic medicine, take it as told by your health care provider. Do not stop taking the antibiotic even if you start to feel better. Keep all follow-up visits. This is important. This information is not intended to replace advice given to you by your health care provider. Make sure you discuss any questions you have with your health care provider. Document Revised: 03/10/2021 Document Reviewed: 03/10/2021 Elsevier Patient Education  2024 [employer]   If you have been instructed to have an in-person evaluation today at a local Urgent Care facility, please use the link below. It will take you to a list of all of our available [facility], including address, phone number and hours of operation. Please do not delay care.  [facility]  If you or a family member do not have a primary care provider, use the link below to schedule a visit and establish care. When you choose a [facility] primary care physician or advanced practice provider, you  gain a long-term partner in health. Find a Primary Care Provider  Learn more about [facility]'s in-office and virtual care options: [facility] - Get Care Now

## 2023-06-28 NOTE — Progress Notes (Signed)
 Virtual Visit Consent   Stephanie Chambers, you are scheduled for a virtual visit with a [facility] provider today. Just as with appointments in the office, your consent must be obtained to participate. Your consent will be active for this visit and any virtual visit you may have with one of our providers in the next 365 days. If you have a MyChart account, a copy of this consent can be sent to you electronically.  As this is a virtual visit, video technology does not allow for your provider to perform a traditional examination. This may limit your provider's ability to fully assess your condition. If your provider identifies any concerns that need to be evaluated in person or the need to arrange testing (such as labs, EKG, etc.), we will make arrangements to do so. Although advances in technology are sophisticated, we cannot ensure that it will always work on either your end or our end. If the connection with a video visit is poor, the visit may have to be switched to a telephone visit. With either a video or telephone visit, we are not always able to ensure that we have a secure connection.  By engaging in this virtual visit, you consent to the provision of healthcare and authorize for your insurance to be billed (if applicable) for the services provided during this visit. Depending on your insurance coverage, you may receive a charge related to this service.  I need to obtain your verbal consent now. Are you willing to proceed with your visit today? Stephanie Chambers has provided verbal consent on 06/28/2023 for a virtual visit (video or telephone). Margaretann Loveless, PA-C  Date: 06/28/2023 8:28 AM   Virtual Visit via Video Note   I, Margaretann Loveless, connected with  Stephanie Chambers  ([id number], 11/22/1975) on 06/28/23 at  8:00 AM EDT by a video-enabled telemedicine application and verified that I am speaking with the correct person using two identifiers.  Location: Patient: Virtual Visit  Location Patient: Home Provider: Virtual Visit Location Provider: Home Office   I discussed the limitations of evaluation and management by telemedicine and the availability of in person appointments. The patient expressed understanding and agreed to proceed.    History of Present Illness: Stephanie Chambers is a 48 y.o. who identifies as a female who was assigned female at birth, and is being seen today for sinus congestion and nausea.  HPI: Sinusitis This is a new problem. The current episode started in the past 7 days. The problem has been gradually worsening since onset. There has been no fever. The pain is moderate. Associated symptoms include chills, congestion, ear pain (left), headaches and sinus pressure (left worse). Pertinent negatives include no coughing, diaphoresis or sore throat. (Dizziness, Nausea, diarrhea) Past treatments include acetaminophen (ice pack). The treatment provided no relief.     Problems:  Patient Active Problem List   Diagnosis Date Noted   Sinus congestion 01/19/2023   History of motion sickness 09/10/2022   Swelling of knee joint 02/17/2022   Dysuria 02/14/2022   Right ovarian cyst 09/19/2021   Screening mammogram for breast cancer 05/14/2021   Depression    Anxiety 05/29/2018   Peptic ulcer disease 05/29/2018   Polyarthralgia 04/27/2017   Hair loss 04/27/2017   Family history of colon cancer in father 04/27/2017   Family history of breast cancer in mother 02/12/2016   Cephalalgia 10/14/2015   PMS (premenstrual syndrome) 10/14/2015   History of anemia 04/19/2013    Allergies:  Allergies  Allergen Reactions   Cefdinir Shortness Of Breath   Almond Meal (Obsolete) Itching   Cherry Itching   Peach [Prunus Persica] Itching   Medications:  Current Outpatient Medications:    azelastine (ASTELIN) 0.1 % nasal spray, Place 1 spray into both nostrils 2 (two) times daily. Use in each nostril as directed, Disp: 30 mL, Rfl: 0   doxycycline (VIBRA-TABS)  100 MG tablet, Take 1 tablet (100 mg total) by mouth 2 (two) times daily., Disp: 20 tablet, Rfl: 0   fluticasone (FLONASE) 50 MCG/ACT nasal spray, Place 2 sprays into both nostrils daily., Disp: 16 g, Rfl: 0   ondansetron (ZOFRAN-ODT) 4 MG disintegrating tablet, Take 1-2 tablets (4-8 mg total) by mouth every 8 (eight) hours as needed., Disp: 20 tablet, Rfl: 0   ALPRAZolam (XANAX) 0.25 MG tablet, Take 1 tablet (0.25 mg total) by mouth at bedtime as needed for anxiety (will drowsiness.)., Disp: 30 tablet, Rfl: 0   buPROPion (WELLBUTRIN SR) 150 MG 12 hr tablet, Take 1 tablet (150 mg total) by mouth 2 (two) times daily., Disp: 120 tablet, Rfl: 0   cetirizine (ZYRTEC ALLERGY) 10 MG tablet, Take 1 pill once daily as needed, Disp: 90 tablet, Rfl: 14   escitalopram (LEXAPRO) 5 MG tablet, Take 1 tablet (5 mg total) by mouth daily., Disp: 90 tablet, Rfl: 1   meloxicam (MOBIC) 15 MG tablet, Take 1 tablet (15 mg total) by mouth daily., Disp: 30 tablet, Rfl: 3   methylPREDNISolone (MEDROL DOSEPAK) 4 MG TBPK tablet, Take as directed for 6 days. Instructions on package,, Disp: 21 tablet, Rfl: 0  Observations/Objective: Patient is well-developed, well-nourished in no acute distress.  Resting comfortably at home.  Head is normocephalic, atraumatic.  No labored breathing.  Speech is clear and coherent with logical content.  Patient is alert and oriented at baseline.    Assessment and Plan: 1. Acute bacterial sinusitis (Primary) - azelastine (ASTELIN) 0.1 % nasal spray; Place 1 spray into both nostrils 2 (two) times daily. Use in each nostril as directed  Dispense: 30 mL; Refill: 0 - fluticasone (FLONASE) 50 MCG/ACT nasal spray; Place 2 sprays into both nostrils daily.  Dispense: 16 g; Refill: 0 - ondansetron (ZOFRAN-ODT) 4 MG disintegrating tablet; Take 1-2 tablets (4-8 mg total) by mouth every 8 (eight) hours as needed.  Dispense: 20 tablet; Refill: 0 - doxycycline (VIBRA-TABS) 100 MG tablet; Take 1 tablet  (100 mg total) by mouth 2 (two) times daily.  Dispense: 20 tablet; Refill: 0  - Worsening symptoms that have not responded to OTC medications.  - Will give Doxycycline - Azelastine and Flonase refilled - Zofran for nausea - Continue allergy medications.  - Steam and humidifier can help - Stay well hydrated and get plenty of rest.  - Seek in person evaluation if no symptom improvement or if symptoms worsen   Follow Up Instructions: I discussed the assessment and treatment plan with the patient. The patient was provided an opportunity to ask questions and all were answered. The patient agreed with the plan and demonstrated an understanding of the instructions.  A copy of instructions were sent to the patient via MyChart unless otherwise noted below.    The patient was advised to call back or seek an in-person evaluation if the symptoms worsen or if the condition fails to improve as anticipated.    Margaretann Loveless, PA-C

## 2023-07-01 ENCOUNTER — Other Ambulatory Visit: Payer: Self-pay

## 2023-07-01 ENCOUNTER — Ambulatory Visit (INDEPENDENT_AMBULATORY_CARE_PROVIDER_SITE_OTHER): Payer: Self-pay | Admitting: Internal Medicine

## 2023-07-01 ENCOUNTER — Encounter: Payer: Self-pay | Admitting: Internal Medicine

## 2023-07-01 VITALS — BP 102/76 | HR 65 | Ht 66.0 in | Wt 152.2 lb

## 2023-07-01 DIAGNOSIS — E785 Hyperlipidemia, unspecified: Secondary | ICD-10-CM | POA: Diagnosis not present

## 2023-07-01 DIAGNOSIS — L659 Nonscarring hair loss, unspecified: Secondary | ICD-10-CM

## 2023-07-01 DIAGNOSIS — Z1231 Encounter for screening mammogram for malignant neoplasm of breast: Secondary | ICD-10-CM | POA: Diagnosis not present

## 2023-07-01 DIAGNOSIS — Z0000 Encounter for general adult medical examination without abnormal findings: Secondary | ICD-10-CM

## 2023-07-01 DIAGNOSIS — F419 Anxiety disorder, unspecified: Secondary | ICD-10-CM

## 2023-07-01 DIAGNOSIS — Z800 Family history of malignant neoplasm of digestive organs: Secondary | ICD-10-CM | POA: Diagnosis not present

## 2023-07-01 DIAGNOSIS — M2550 Pain in unspecified joint: Secondary | ICD-10-CM

## 2023-07-01 MED ORDER — ESCITALOPRAM OXALATE 5 MG PO TABS
5.0000 mg | ORAL_TABLET | Freq: Every day | ORAL | 1 refills | Status: DC
  Filled 2023-07-01: qty 90, 90d supply, fill #0
  Filled 2024-01-17: qty 90, 90d supply, fill #1

## 2023-07-01 MED ORDER — BUPROPION HCL ER (SR) 150 MG PO TB12
150.0000 mg | ORAL_TABLET | Freq: Two times a day (BID) | ORAL | 0 refills | Status: DC
  Filled 2023-07-01: qty 120, 60d supply, fill #0

## 2023-07-01 MED ORDER — OSELTAMIVIR PHOSPHATE 75 MG PO CAPS
75.0000 mg | ORAL_CAPSULE | Freq: Two times a day (BID) | ORAL | 0 refills | Status: DC
  Filled 2023-07-01: qty 10, 5d supply, fill #0

## 2023-07-01 MED ORDER — SCOPOLAMINE 1 MG/3DAYS TD PT72
1.0000 | MEDICATED_PATCH | TRANSDERMAL | 0 refills | Status: DC
  Filled 2023-07-01: qty 4, 12d supply, fill #0

## 2023-07-01 MED ORDER — AZITHROMYCIN 500 MG PO TABS
500.0000 mg | ORAL_TABLET | Freq: Every day | ORAL | 0 refills | Status: DC
  Filled 2023-07-01: qty 7, 7d supply, fill #0

## 2023-07-01 MED ORDER — PROMETHAZINE HCL 25 MG RE SUPP
25.0000 mg | Freq: Four times a day (QID) | RECTAL | 0 refills | Status: AC | PRN
  Filled 2023-07-01: qty 12, 3d supply, fill #0

## 2023-07-01 NOTE — Assessment & Plan Note (Addendum)
 She HAS ACCEPTED REFERRAL for colonoscopy

## 2023-07-01 NOTE — Assessment & Plan Note (Addendum)
 Workup for autoimmune causes of her hand pain given her family history was ordered and done June 2024  and were negative

## 2023-07-01 NOTE — Patient Instructions (Signed)
 I WILL DO YOUR PAP SMEAR NEXT YEAR  COLONOSCOPY REFERRAL MADE TO [facility]  TRAVEL PACKAGE SENT TO [facility]   HAVE  A GREAT SAFE TIME!!!

## 2023-07-01 NOTE — Progress Notes (Signed)
 Patient ID: Stephanie Chambers, female    DOB: [birth year]  Age: 48 y.o. MRN: [id number]  The patient is here for annual preventive examination and management of other chronic and acute problems.   The risk factors are reflected in the social history.   The roster of all physicians providing medical care to patient - is listed in the Snapshot section of the chart.   Activities of daily living:  The patient is 100% independent in all ADLs: dressing, toileting, feeding as well as independent mobility   Home safety : The patient has smoke detectors in the home. They wear seatbelts.  There are no unsecured firearms at home. There is no violence in the home.    There is no risks for hepatitis, STDs or HIV. There is no   history of blood transfusion. They have no travel history to infectious disease endemic areas of the world.   The patient has seen their dentist in the last six month. They have seen their eye doctor in the last year. The patinet  denies slight hearing difficulty with regard to whispered voices and some television programs.  They have deferred audiologic testing in the last year.  They do not  have excessive sun exposure. Discussed the need for sun protection: hats, long sleeves and use of sunscreen if there is significant sun exposure.    Diet: the importance of a healthy diet is discussed. They do have a healthy diet.   The benefits of regular aerobic exercise were discussed. The patient  exercises  3 to 5 days per week  for  60 minutes.    Depression screen: there are no signs or vegative symptoms of depression- irritability, change in appetite, anhedonia, sadness/tearfullness.   The following portions of the patient's history were reviewed and updated as appropriate: allergies, current medications, past family history, past medical history,  past surgical history, past social history  and problem list.   Visual acuity was not assessed per patient preference since the patient has  regular follow up with an  ophthalmologist. Hearing and body mass index were assessed and reviewed.    During the course of the visit the patient was educated and counseled about appropriate screening and preventive services including : fall prevention , diabetes screening, nutrition counseling, colorectal cancer screening, and recommended immunizations.    Chief Complaint:  HPI     Annual Exam    Additional comments: Physical       Last edited by Sandy Salaam, CMA on 07/01/2023  3:56 PM.     perimenopausal:  using lexapro   stopped it due to weight gain of 30 lbs now on 5 mg lexapro for the past 5 weeks , feeling "less crazy" craving sweet    Review of Symptoms  Patient denies headache, fevers, malaise, unintentional weight loss, skin rash, eye pain, sinus congestion and sinus pain, sore throat, dysphagia,  hemoptysis , cough, dyspnea, wheezing, chest pain, palpitations, orthopnea, edema, abdominal pain, nausea, melena, diarrhea, constipation, flank pain, dysuria, hematuria, urinary  Frequency, nocturia, numbness, tingling, seizures,  Focal weakness, Loss of consciousness,  Tremor, insomnia, depression, anxiety, and suicidal ideation.    Physical Exam:  BP 102/76   Pulse 65   Ht 5\' 6"  (1.676 m)   Wt 152 lb 3.2 oz (69 kg)   SpO2 97%   BMI 24.57 kg/m    Physical Exam Vitals reviewed.  Constitutional:      General: She is not in acute distress.    Appearance:  Normal appearance. She is well-developed and normal weight. She is not ill-appearing, toxic-appearing or diaphoretic.  HENT:     Head: Normocephalic.     Right Ear: Tympanic membrane, ear canal and external ear normal. There is no impacted cerumen.     Left Ear: Tympanic membrane, ear canal and external ear normal. There is no impacted cerumen.     Nose: Nose normal.     Mouth/Throat:     Mouth: Mucous membranes are moist.     Pharynx: Oropharynx is clear.  Eyes:     General: No scleral icterus.       Right eye:  No discharge.        Left eye: No discharge.     Conjunctiva/sclera: Conjunctivae normal.     Pupils: Pupils are equal, round, and reactive to light.  Neck:     Thyroid: No thyromegaly.     Vascular: No carotid bruit or JVD.  Cardiovascular:     Rate and Rhythm: Normal rate and regular rhythm.     Heart sounds: Normal heart sounds.  Pulmonary:     Effort: Pulmonary effort is normal. No respiratory distress.     Breath sounds: Normal breath sounds.  Chest:  Breasts:    Breasts are symmetrical.     Right: Normal. No swelling, inverted nipple, mass, nipple discharge, skin change or tenderness.     Left: Normal. No swelling, inverted nipple, mass, nipple discharge, skin change or tenderness.  Abdominal:     General: Bowel sounds are normal.     Palpations: Abdomen is soft. There is no mass.     Tenderness: There is no abdominal tenderness. There is no guarding or rebound.  Musculoskeletal:        General: Normal range of motion.     Cervical back: Normal range of motion and neck supple.  Lymphadenopathy:     Cervical: No cervical adenopathy.     Upper Body:     Right upper body: No supraclavicular, axillary or pectoral adenopathy.     Left upper body: No supraclavicular, axillary or pectoral adenopathy.  Skin:    General: Skin is warm and dry.  Neurological:     General: No focal deficit present.     Mental Status: She is alert and oriented to person, place, and time. Mental status is at baseline.  Psychiatric:        Mood and Affect: Mood normal.        Behavior: Behavior normal.        Thought Content: Thought content normal.        Judgment: Judgment normal.    Assessment and Plan: Hair loss  Anxiety -     Escitalopram Oxalate; Take 1 tablet (5 mg total) by mouth daily.  Dispense: 90 tablet; Refill: 1  Polyarthralgia Assessment & Plan: Workup for autoimmune causes of her hand pain given her family history was ordered and done June 2024  and were negative     Dyslipidemia -     Lipid Panel w/reflex Direct LDL; Future -     Comprehensive metabolic panel -     TSH  Encounter for screening mammogram for malignant neoplasm of breast -     3D Screening Mammogram, Left and Right; Future  Encounter for preventative adult health care examination  Family history of colon cancer in father Assessment & Plan: She HAS ACCEPTED REFERRAL for colonoscopy   Family history of colon cancer requiring screening colonoscopy -     Ambulatory  referral to Gastroenterology  Encounter for preventive health examination Assessment & Plan: age appropriate education and counseling updated, referrals for preventative services and immunizations addressed, dietary and smoking counseling addressed, most recent labs reviewed.  I have personally reviewed and have noted:   1) the patient's medical and social history 2) The pt's use of alcohol, tobacco, and illicit drugs 3) The patient's current medications and supplements 4) Functional ability including ADL's, fall risk, home safety risk, hearing and visual impairment 5) Diet and physical activities 6) Evidence for depression or mood disorder 7) The patient's height, weight, and BMI have been recorded in the chart     I have made referrals, and provided counseling and education based on review of the above    Other orders -     buPROPion HCl ER (SR); Take 1 tablet (150 mg total) by mouth 2 (two) times daily.  Dispense: 120 tablet; Refill: 0 -     Scopolamine; Place 1 patch (1.5 mg total) onto the skin every 3 (three) days.  Dispense: 4 patch; Refill: 0 -     Azithromycin; Take 1 tablet (500 mg total) by mouth daily.  Dispense: 7 tablet; Refill: 0 -     Promethazine HCl; Place 1 suppository (25 mg total) rectally every 6 (six) hours as needed for nausea or vomiting.  Dispense: 12 each; Refill: 0 -     Oseltamivir Phosphate; Take 1 capsule (75 mg total) by mouth 2 (two) times daily.  Dispense: 10 capsule; Refill:  0    No follow-ups on file.  Sherlene Shams, MD

## 2023-07-02 LAB — COMPREHENSIVE METABOLIC PANEL
AG Ratio: 2.4 (calc) (ref 1.0–2.5)
ALT: 12 U/L (ref 6–29)
AST: 15 U/L (ref 10–35)
Albumin: 4.3 g/dL (ref 3.6–5.1)
Alkaline phosphatase (APISO): 70 U/L (ref 31–125)
BUN/Creatinine Ratio: 14 (calc) (ref 6–22)
BUN: 15 mg/dL (ref 7–25)
CO2: 24 mmol/L (ref 20–32)
Calcium: 9.4 mg/dL (ref 8.6–10.2)
Chloride: 108 mmol/L (ref 98–110)
Creat: 1.08 mg/dL — ABNORMAL HIGH (ref 0.50–0.99)
Globulin: 1.8 g/dL — ABNORMAL LOW (ref 1.9–3.7)
Glucose, Bld: 85 mg/dL (ref 65–99)
Potassium: 4.1 mmol/L (ref 3.5–5.3)
Sodium: 143 mmol/L (ref 135–146)
Total Bilirubin: 0.3 mg/dL (ref 0.2–1.2)
Total Protein: 6.1 g/dL (ref 6.1–8.1)

## 2023-07-02 LAB — TSH: TSH: 0.9 m[IU]/L

## 2023-07-03 ENCOUNTER — Encounter: Payer: Self-pay | Admitting: Internal Medicine

## 2023-07-03 ENCOUNTER — Other Ambulatory Visit: Payer: Self-pay

## 2023-07-03 NOTE — Assessment & Plan Note (Signed)

## 2023-07-04 ENCOUNTER — Other Ambulatory Visit: Payer: Self-pay

## 2023-07-05 ENCOUNTER — Other Ambulatory Visit: Payer: Self-pay

## 2023-07-05 ENCOUNTER — Telehealth: Payer: Self-pay

## 2023-07-05 DIAGNOSIS — Z1211 Encounter for screening for malignant neoplasm of colon: Secondary | ICD-10-CM

## 2023-07-05 DIAGNOSIS — Z800 Family history of malignant neoplasm of digestive organs: Secondary | ICD-10-CM

## 2023-07-05 MED ORDER — NA SULFATE-K SULFATE-MG SULF 17.5-3.13-1.6 GM/177ML PO SOLN
1.0000 | Freq: Once | ORAL | 0 refills | Status: AC
  Filled 2023-07-05: qty 354, 1d supply, fill #0

## 2023-07-05 NOTE — Telephone Encounter (Signed)
 Gastroenterology Pre-Procedure Review  Request Date: 08/26/23 Requesting Physician: Dr. Allegra Lai  PATIENT REVIEW QUESTIONS: The patient responded to the following health history questions as indicated:    1. Are you having any GI issues? no 2. Do you have a personal history of Polyps? no 3. Do you have a family history of Colon Cancer or Polyps?maternal grandmother and father colon cancer 4. Diabetes Mellitus? no 5. Joint replacements in the past 12 months?no 6. Major health problems in the past 3 months?no 7. Any artificial heart valves, MVP, or defibrillator?no    MEDICATIONS & ALLERGIES:    Patient reports the following regarding taking any anticoagulation/antiplatelet therapy:   Plavix, Coumadin, Eliquis, Xarelto, Lovenox, Pradaxa, Brilinta, or Effient? no Aspirin? no  Patient confirms/reports the following medications:  Current Outpatient Medications  Medication Sig Dispense Refill   ALPRAZolam (XANAX) 0.25 MG tablet Take 1 tablet (0.25 mg total) by mouth at bedtime as needed for anxiety (will drowsiness.). 30 tablet 0   azelastine (ASTELIN) 0.1 % nasal spray Place 1 spray into both nostrils 2 (two) times daily. Use in each nostril as directed 30 mL 0   azithromycin (ZITHROMAX) 500 MG tablet Take 1 tablet (500 mg total) by mouth daily. 7 tablet 0   buPROPion (WELLBUTRIN SR) 150 MG 12 hr tablet Take 1 tablet (150 mg total) by mouth 2 (two) times daily. 120 tablet 0   cetirizine (ZYRTEC ALLERGY) 10 MG tablet Take 1 pill once daily as needed 90 tablet 14   doxycycline (VIBRA-TABS) 100 MG tablet Take 1 tablet (100 mg total) by mouth 2 (two) times daily. 20 tablet 0   escitalopram (LEXAPRO) 5 MG tablet Take 1 tablet (5 mg total) by mouth daily. 90 tablet 1   fluticasone (FLONASE) 50 MCG/ACT nasal spray Place 2 sprays into both nostrils daily. 16 g 0   ondansetron (ZOFRAN-ODT) 4 MG disintegrating tablet Take 1-2 tablets (4-8 mg total) by mouth every 8 (eight) hours as needed. 20 tablet 0    oseltamivir (TAMIFLU) 75 MG capsule Take 1 capsule (75 mg total) by mouth 2 (two) times daily. 10 capsule 0   promethazine (PHENERGAN) 25 MG suppository Place 1 suppository (25 mg total) rectally every 6 (six) hours as needed for nausea or vomiting. 12 each 0   scopolamine (TRANSDERM-SCOP) 1 MG/3DAYS Place 1 patch (1.5 mg total) onto the skin every 3 (three) days. 4 patch 0   No current facility-administered medications for this visit.    Patient confirms/reports the following allergies:  Allergies  Allergen Reactions   Cefdinir Shortness Of Breath   Almond Meal (Obsolete) Itching   Cherry Itching   Peach [Prunus Persica] Itching    No orders of the defined types were placed in this encounter.   AUTHORIZATION INFORMATION Primary Insurance: 1D#: Group #:  Secondary Insurance: 1D#: Group #:  SCHEDULE INFORMATION: Date: 08/26/23 Time: Location: [facility]

## 2023-07-18 ENCOUNTER — Other Ambulatory Visit: Payer: Self-pay

## 2023-07-28 ENCOUNTER — Telehealth

## 2023-07-28 ENCOUNTER — Other Ambulatory Visit: Payer: Self-pay

## 2023-07-28 DIAGNOSIS — J301 Allergic rhinitis due to pollen: Secondary | ICD-10-CM

## 2023-07-28 MED ORDER — PREDNISONE 10 MG PO TABS
ORAL_TABLET | ORAL | 0 refills | Status: DC
  Filled 2023-07-28: qty 21, 6d supply, fill #0

## 2023-07-28 MED ORDER — SULFAMETHOXAZOLE-TRIMETHOPRIM 800-160 MG PO TABS
1.0000 | ORAL_TABLET | Freq: Two times a day (BID) | ORAL | 0 refills | Status: DC
  Filled 2023-07-28: qty 14, 7d supply, fill #0

## 2023-07-28 NOTE — Progress Notes (Signed)
 Virtual Visit Consent   Stephanie Chambers, you are scheduled for a virtual visit with a [facility] provider today. Just as with appointments in the office, your consent must be obtained to participate. Your consent will be active for this visit and any virtual visit you may have with one of our providers in the next 365 days. If you have a MyChart account, a copy of this consent can be sent to you electronically.  As this is a virtual visit, video technology does not allow for your provider to perform a traditional examination. This may limit your provider's ability to fully assess your condition. If your provider identifies any concerns that need to be evaluated in person or the need to arrange testing (such as labs, EKG, etc.), we will make arrangements to do so. Although advances in technology are sophisticated, we cannot ensure that it will always work on either your end or our end. If the connection with a video visit is poor, the visit may have to be switched to a telephone visit. With either a video or telephone visit, we are not always able to ensure that we have a secure connection.  By engaging in this virtual visit, you consent to the provision of healthcare and authorize for your insurance to be billed (if applicable) for the services provided during this visit. Depending on your insurance coverage, you may receive a charge related to this service.  I need to obtain your verbal consent now. Are you willing to proceed with your visit today? Stephanie Chambers has provided verbal consent on 07/28/2023 for a virtual visit (video or telephone). Piedad Climes, [place]  Date: 07/28/2023 7:46 AM   Virtual Visit via Video Note   I, Piedad Climes, connected with  Stephanie Chambers  ([id number], 05-25-1975) on 07/28/23 at  7:45 AM EDT by a video-enabled telemedicine application and verified that I am speaking with the correct person using two identifiers.  Location: Patient: Virtual Visit  Location Patient: Home Provider: Virtual Visit Location Provider: Home Office   I discussed the limitations of evaluation and management by telemedicine and the availability of in person appointments. The patient expressed understanding and agreed to proceed.    History of Present Illness: Stephanie Chambers is a 3 y.o. who identifies as a female who was assigned female at birth, and is being seen today for 4 days of increased nasal congestion, sinus pressure and post nasal drip. Now with 2 days of intermittent L ear pressure and pain. This is despite [place] of her daily antihistamine, nasal steroid spray, and use of OTC Advil Cold and Sinus. Has history of bad allergies with sinusitis, most recently treated last month with a course of Doxycycline. Notes she would not worry as much about few days of symptoms, but she has to go on a trip involving a plane ride.   HPI: HPI  Problems:  Patient Active Problem List   Diagnosis Date Noted   Sinus congestion 01/19/2023   History of motion sickness 09/10/2022   Swelling of knee joint 02/17/2022   Dysuria 02/14/2022   Right ovarian cyst 09/19/2021   Encounter for preventive health examination 05/14/2021   Depression    Anxiety 05/29/2018   Peptic ulcer disease 05/29/2018   Polyarthralgia 04/27/2017   Hair loss 04/27/2017   Family history of colon cancer in father 04/27/2017   Family history of breast cancer in mother 02/12/2016   Cephalalgia 10/14/2015   PMS (premenstrual syndrome) 10/14/2015  History of anemia 04/19/2013    Allergies:  Allergies  Allergen Reactions   Cefdinir Shortness Of Breath   Almond Meal (Obsolete) Itching   Cherry Itching   Peach [Prunus Persica] Itching   Medications:  Current Outpatient Medications:    predniSONE (STERAPRED UNI-PAK 21 TAB) 10 MG (21) TBPK tablet, Take following package directions, Disp: 21 tablet, Rfl: 0   sulfamethoxazole-trimethoprim (BACTRIM DS) 800-160 MG tablet, Take 1 tablet by mouth 2  (two) times daily., Disp: 14 tablet, Rfl: 0   ALPRAZolam (XANAX) 0.25 MG tablet, Take 1 tablet (0.25 mg total) by mouth at bedtime as needed for anxiety (will drowsiness.)., Disp: 30 tablet, Rfl: 0   azelastine (ASTELIN) 0.1 % nasal spray, Place 1 spray into both nostrils 2 (two) times daily. Use in each nostril as directed, Disp: 30 mL, Rfl: 0   buPROPion (WELLBUTRIN SR) 150 MG 12 hr tablet, Take 1 tablet (150 mg total) by mouth 2 (two) times daily., Disp: 120 tablet, Rfl: 0   cetirizine (ZYRTEC ALLERGY) 10 MG tablet, Take 1 pill once daily as needed, Disp: 90 tablet, Rfl: 14   escitalopram (LEXAPRO) 5 MG tablet, Take 1 tablet (5 mg total) by mouth daily., Disp: 90 tablet, Rfl: 1   fluticasone (FLONASE) 50 MCG/ACT nasal spray, Place 2 sprays into both nostrils daily., Disp: 16 g, Rfl: 0   ondansetron (ZOFRAN-ODT) 4 MG disintegrating tablet, Take 1-2 tablets (4-8 mg total) by mouth every 8 (eight) hours as needed., Disp: 20 tablet, Rfl: 0   promethazine (PHENERGAN) 25 MG suppository, Place 1 suppository (25 mg total) rectally every 6 (six) hours as needed for nausea or vomiting., Disp: 12 each, Rfl: 0   scopolamine (TRANSDERM-SCOP) 1 MG/3DAYS, Place 1 patch (1.5 mg total) onto the skin every 3 (three) days., Disp: 4 patch, Rfl: 0  Observations/Objective: Patient is well-developed, well-nourished in no acute distress.  Resting comfortably at home.  Head is normocephalic, atraumatic.  No labored breathing. Speech is clear and coherent with logical content.  Patient is alert and oriented at baseline.   Assessment and Plan: 1. Seasonal allergic rhinitis due to pollen (Primary) - predniSONE (STERAPRED UNI-PAK 21 TAB) 10 MG (21) TBPK tablet; Take following package directions  Dispense: 21 tablet; Refill: 0  Supportive measures and OTC medications reviewed. Switch to Tylenol Cold and Sinus giving addition of steroid pack for ETD and sinus inflammation, to help before flight. Will put Bactrim on file  to use as directed for any progressive symptoms despite addition of the steroid, as this would be sign of true infection. (She is cephalosporin allergic -- severe, and recently on Doxycycline and Azithromycin so avoiding use of these)  Follow Up Instructions: I discussed the assessment and treatment plan with the patient. The patient was provided an opportunity to ask questions and all were answered. The patient agreed with the plan and demonstrated an understanding of the instructions.  A copy of instructions were sent to the patient via MyChart unless otherwise noted below.   The patient was advised to call back or seek an in-person evaluation if the symptoms worsen or if the condition fails to improve as anticipated.    Piedad Climes, PA-C

## 2023-07-28 NOTE — Patient Instructions (Signed)
 Courtney Paris, thank you for joining Piedad Climes, PA-C for today's virtual visit.  While this provider is not your primary care provider (PCP), if your PCP is located in our provider database this encounter information will be shared with them immediately following your visit.   A [facility] MyChart account gives you access to today's visit and all your visits, tests, and labs performed at [facility] " click here if you don't have a [facility] MyChart account or go to [email or web address]  Consent: (Patient) Stephanie Chambers provided verbal consent for this virtual visit at the beginning of the encounter.  Current Medications:  Current Outpatient Medications:    ALPRAZolam (XANAX) 0.25 MG tablet, Take 1 tablet (0.25 mg total) by mouth at bedtime as needed for anxiety (will drowsiness.)., Disp: 30 tablet, Rfl: 0   azelastine (ASTELIN) 0.1 % nasal spray, Place 1 spray into both nostrils 2 (two) times daily. Use in each nostril as directed, Disp: 30 mL, Rfl: 0   azithromycin (ZITHROMAX) 500 MG tablet, Take 1 tablet (500 mg total) by mouth daily., Disp: 7 tablet, Rfl: 0   buPROPion (WELLBUTRIN SR) 150 MG 12 hr tablet, Take 1 tablet (150 mg total) by mouth 2 (two) times daily., Disp: 120 tablet, Rfl: 0   cetirizine (ZYRTEC ALLERGY) 10 MG tablet, Take 1 pill once daily as needed, Disp: 90 tablet, Rfl: 14   doxycycline (VIBRA-TABS) 100 MG tablet, Take 1 tablet (100 mg total) by mouth 2 (two) times daily., Disp: 20 tablet, Rfl: 0   escitalopram (LEXAPRO) 5 MG tablet, Take 1 tablet (5 mg total) by mouth daily., Disp: 90 tablet, Rfl: 1   fluticasone (FLONASE) 50 MCG/ACT nasal spray, Place 2 sprays into both nostrils daily., Disp: 16 g, Rfl: 0   ondansetron (ZOFRAN-ODT) 4 MG disintegrating tablet, Take 1-2 tablets (4-8 mg total) by mouth every 8 (eight) hours as needed., Disp: 20 tablet, Rfl: 0   oseltamivir (TAMIFLU) 75 MG capsule, Take 1 capsule (75 mg total) by mouth 2  (two) times daily., Disp: 10 capsule, Rfl: 0   promethazine (PHENERGAN) 25 MG suppository, Place 1 suppository (25 mg total) rectally every 6 (six) hours as needed for nausea or vomiting., Disp: 12 each, Rfl: 0   scopolamine (TRANSDERM-SCOP) 1 MG/3DAYS, Place 1 patch (1.5 mg total) onto the skin every 3 (three) days., Disp: 4 patch, Rfl: 0   Medications ordered in this encounter:  No orders of the defined types were placed in this encounter.    *If you need refills on other medications prior to your next appointment, please contact your pharmacy*  Follow-Up: Call back or seek an in-person evaluation if the symptoms worsen or if the condition fails to improve as anticipated.  [facility] [phone]  Other Instructions Please keep well-hydrated and get plenty of rest. Start a saline nasal rinse in addition to your current allergy medication regimen. Switch from Advil to Tylenol Cold and Sinus. Take the prednisone pack as directed. If you note any acute worsening of ear pain, new onset sinus pain, fever or change in nasal discharge, please start the antibiotic, taking as directed.   Feel better soon!   If you have been instructed to have an in-person evaluation today at a local Urgent Care facility, please use the link below. It will take you to a list of all of our available [facility], including address, phone number and hours of operation. Please do not delay care.  [facility]  If you or a family member do not have a primary care provider, use the link below to schedule a visit and establish care. When you choose a [facility] primary care physician or advanced practice provider, you gain a long-term partner in health. Find a Primary Care Provider  Learn more about [facility]'s in-office and virtual care options: [facility] - Get Care Now

## 2023-08-26 ENCOUNTER — Ambulatory Visit: Admit: 2023-08-26 | Admitting: Gastroenterology

## 2023-08-26 SURGERY — COLONOSCOPY
Anesthesia: General

## 2023-09-22 ENCOUNTER — Encounter

## 2023-10-03 ENCOUNTER — Ambulatory Visit
Admission: RE | Admit: 2023-10-03 | Discharge: 2023-10-03 | Disposition: A | Discharge status: Home / Self Care | Source: Ambulatory Visit | Attending: Internal Medicine | Admitting: Internal Medicine

## 2023-10-03 DIAGNOSIS — Z1231 Encounter for screening mammogram for malignant neoplasm of breast: Secondary | ICD-10-CM | POA: Diagnosis not present

## 2023-11-24 ENCOUNTER — Other Ambulatory Visit: Payer: Self-pay

## 2023-11-24 DIAGNOSIS — J340 Abscess, furuncle and carbuncle of nose: Secondary | ICD-10-CM | POA: Diagnosis not present

## 2023-11-24 MED ORDER — GENTAMICIN SULFATE 0.1 % EX OINT
TOPICAL_OINTMENT | Freq: Three times a day (TID) | CUTANEOUS | 11 refills | Status: DC
  Filled 2023-11-24: qty 30, 30d supply, fill #0

## 2023-12-28 DIAGNOSIS — J340 Abscess, furuncle and carbuncle of nose: Secondary | ICD-10-CM | POA: Diagnosis not present

## 2024-01-17 ENCOUNTER — Other Ambulatory Visit: Payer: Self-pay | Admitting: Internal Medicine

## 2024-01-17 ENCOUNTER — Other Ambulatory Visit: Payer: Self-pay

## 2024-01-17 ENCOUNTER — Ambulatory Visit: Payer: Self-pay

## 2024-01-17 MED ORDER — BUPROPION HCL ER (SR) 150 MG PO TB12
150.0000 mg | ORAL_TABLET | Freq: Two times a day (BID) | ORAL | 0 refills | Status: DC
  Filled 2024-01-17: qty 60, 30d supply, fill #0

## 2024-01-17 NOTE — Telephone Encounter (Signed)
 FYI Only or Action Required?: FYI only for provider.  Patient was last seen in primary care on 07/28/2023 by Stephanie Elsie BROCKS, PA-C.  Called Nurse Triage reporting Foot Swelling.  Symptoms began several months ago.  Interventions attempted: Nothing.  Symptoms are: gradually worsening.  Triage Disposition: See Physician Within 24 Hours  Patient/caregiver understands and will follow disposition?: Yes       Copied from CRM #[id number]. Topic: Clinical - Red Word Triage >> Jan 17, 2024  8:15 AM Timindy P wrote: Red Word that prompted transfer to Nurse Triage: Pain and swelling in hands, feet and back. Worse today than it has been. Reason for Disposition  [1] MILD swelling (puffiness) of both hands AND [2] new-onset or getting worse  (Exception: Caused by hot weather or normal pregnancy swelling.)  Answer Assessment - Initial Assessment Questions 1. ONSET: When did the swelling start? (e.g., minutes, hours, days)     2 months  2. LOCATION: What part of the leg is swollen?  Are both legs swollen or just one leg?     All 10 toes  and fingers 3. SEVERITY: How bad is the swelling? (e.g., localized; mild, moderate, severe)     Mild  4. REDNESS: Is there redness or signs of infection?     no 5. PAIN: Is the swelling painful to touch? If Yes, ask: How painful is it?   (Scale 1-10; mild, moderate or severe)     Toes: 3-4 hands: 6-7 6. FEVER: Do you have a fever? If Yes, ask: What is it, how was it measured, and when did it start?      no 7. CAUSE: What do you think is causing the leg swelling?     menopause  9. RECURRENT SYMPTOM: Have you had leg swelling before? If Yes, ask: When was the last time? What happened that time?     Yes 2 years ago  10. OTHER SYMPTOMS: Do you have any other symptoms? (e.g., chest pain, difficulty breathing)       no  Answer Assessment - Initial Assessment Questions 1. ONSET: When did the swelling start? (e.g., minutes, hours,  days)     2 months ago 2. LOCATION: What part of the hand is swollen?  Are both hands swollen or just one hand?     Both hands 3. TYPE : What does it look like? (e.g., ball, lump; localized; hand swelling)     Hand swelling 4. SWELLING SEVERITY: If more than a lump or localized, ask: How bad is the hand swelling? (e.g., mild, moderate, severe; describe)     mild 5. REDNESS: Is there redness or signs of infection?     no 6. PAIN: Is the swelling painful to touch? If Yes, ask: How painful is it?   (Scale 1-10; mild, moderate or severe)     7 7. FEVER: Do you have a fever? If Yes, ask: What is it, how was it measured, and when did it start?      no 8. CAUSE: What do you think is causing the hand swelling? (e.g., heat, insect bite, pregnancy, recent injury)     Menopause  10. RECURRENT SYMPTOM: Have you had hand swelling before? If Yes, ask: When was the last time? What happened that time?       Yes 2 years ago  11. OTHER SYMPTOMS: Do you have any other symptoms? (e.g., blurred vision, difficulty breathing, headache)       Swollen toes  Protocols used: Leg Swelling  and Edema-A-AH, Hand Swelling-A-AH

## 2024-01-17 NOTE — Telephone Encounter (Signed)
 Pt is scheduled to see Chelsea Aurora, NP on 01/19/2024.

## 2024-01-19 ENCOUNTER — Ambulatory Visit: Admitting: Nurse Practitioner

## 2024-01-19 ENCOUNTER — Encounter: Payer: Self-pay | Admitting: Nurse Practitioner

## 2024-01-19 VITALS — BP 116/74 | HR 70 | Temp 97.7°F | Ht 66.0 in | Wt 159.2 lb

## 2024-01-19 DIAGNOSIS — M2559 Pain in other specified joint: Secondary | ICD-10-CM | POA: Diagnosis not present

## 2024-01-19 DIAGNOSIS — M2550 Pain in unspecified joint: Secondary | ICD-10-CM

## 2024-01-19 DIAGNOSIS — M25541 Pain in joints of right hand: Secondary | ICD-10-CM

## 2024-01-19 DIAGNOSIS — M25542 Pain in joints of left hand: Secondary | ICD-10-CM

## 2024-01-19 NOTE — Progress Notes (Signed)
 Established Patient Office Visit  Subjective:  Patient ID: Stephanie Chambers, female    DOB: 10/14/1975  Age: 48 y.o. MRN: [id number]  CC:  Chief Complaint  Patient presents with   Acute Visit    Bilateral finger & toe pain and swelling Finger pain 7/10 Toe Pain 3/10 Mid Back Pain Back Pain 7/10   Discussed the use of a AI scribe software for clinical note transcription with the patient, who gave verbal consent to proceed.  HPI  Stephanie Chambers is a 48 year old female who presents with joint pain and swelling.  She experiences significant joint pain and swelling for the past four to five months, primarily in her hands, toes, and recently the center of her back. Morning pain is most severe, with swelling in her hands and 'clicky' thumbs that are difficult to move. Her pinkies are worsening, and she sometimes uses ice packs for relief. Her toes are less swollen but painful, especially after breaking a toe three weeks ago. A dull, hard ache in the middle of her back worsens when lying down or standing for extended periods.  She associates her symptoms with menopausal changes, noting the absence of menstruation for about a year. There is no personal history of rheumatoid arthritis, but her family history includes arthritis in her mother and maternal grandmother's sister.  Her current medications include Wellbutrin  daily, Lexapro  5 mg at night, and occasional Xanax  for situational anxiety. She uses Advil  for pain relief but prefers to limit its use. She denies numbness or tingling but feels bloated and has puffy eyes, which she attributes to menopause.    HPI   Past Medical History:  Diagnosis Date   Acute cystitis without hematuria 07/12/2022   Anemia    Anxiety    Bulging of cervical intervertebral disc    Constipation    Depression    major   DUB (dysfunctional uterine bleeding)    Endometrial polyp 05/2012   hysteroscopy w/ d &C   Epigastric abdominal pain    Lump in  female breast    Mastodynia    Menorrhagia    PONV (postoperative nausea and vomiting)    UTI (lower urinary tract infection)     Past Surgical History:  Procedure Laterality Date   BREAST BIOPSY Right 04/24/2018   Affirm Bx- X-clip,  FOCAL SCLEROSING ADENOSIS WITH ASSOCIATED CALCIFICATIONS   BREAST EXCISIONAL BIOPSY Right 10/11/2018   MILD FIBROCYSTIC CHANGES   DIAGNOSTIC LAPAROSCOPY  2004   DILATION AND CURETTAGE OF UTERUS     HYSTEROSCOPY     NEVUS EXCISION Left 10/12/2018   Procedure: NEVUS EXCISION X TWO NECK AND CHEST;  Surgeon: Ebbie Cough, MD;  Location: [facility];  Service: General;  Laterality: Left;   RADIOACTIVE SEED GUIDED EXCISIONAL BREAST BIOPSY Right 10/12/2018   Procedure: RADIOACTIVE SEED GUIDED EXCISIONAL RIGHT BREAST BIOPSY AND REMOVAL OF MOLE RIGHT BREAST;  Surgeon: Ebbie Cough, MD;  Location: [facility];  Service: General;  Laterality: Right;   TONSILLECTOMY  1998    Family History  Problem Relation Age of Onset   Cancer Mother 60       breast with liver mets   Breast cancer Mother 53   Cancer Father 71       colon, lung, kidney   Heart disease Father    Colon cancer Father    Depression Maternal Grandmother    Cancer Maternal Grandmother        colon  Colon cancer Maternal Grandmother    Depression Paternal Grandmother    Heart disease Paternal Grandfather    Breast cancer Cousin 58   Hypertension Brother    Ovarian cancer Neg Hx     Social History   Socioeconomic History   Marital status: Married    Spouse name: Not on file   Number of children: Not on file   Years of education: Not on file   Highest education level: Not on file  Occupational History   Not on file  Tobacco Use   Smoking status: Former   Smokeless tobacco: Never  Vaping Use   Vaping status: Never Used  Substance and Sexual Activity   Alcohol use: Yes    Alcohol/week: 0.0 standard drinks of alcohol    Comment: occassional    Drug use: No   Sexual activity: Yes    Birth control/protection: None  Other Topics Concern   Not on file  Social History Narrative   Not on file   Social Drivers of Health   Financial Resource Strain: Not on file  Food Insecurity: Not on file  Transportation Needs: Not on file  Physical Activity: Insufficiently Active (10/06/2017)   Exercise Vital Sign    Days of Exercise per Week: 3 days    Minutes of Exercise per Session: 30 min  Stress: Not on file  Social Connections: Not on file  Intimate Partner Violence: Not on file     Outpatient Medications Prior to Visit  Medication Sig Dispense Refill   ALPRAZolam  (XANAX ) 0.25 MG tablet Take 1 tablet (0.25 mg total) by mouth at bedtime as needed for anxiety (will drowsiness.). 30 tablet 0   azelastine  (ASTELIN ) 0.1 % nasal spray Place 1 spray into both nostrils 2 (two) times daily. Use in each nostril as directed 30 mL 0   buPROPion  (WELLBUTRIN  SR) 150 MG 12 hr tablet Take 1 tablet (150 mg total) by mouth 2 (two) times daily. 60 tablet 0   cetirizine  (ZYRTEC  ALLERGY) 10 MG tablet Take 1 pill once daily as needed 90 tablet 14   escitalopram  (LEXAPRO ) 5 MG tablet Take 1 tablet (5 mg total) by mouth daily. 90 tablet 1   ondansetron  (ZOFRAN -ODT) 4 MG disintegrating tablet Take 1-2 tablets (4-8 mg total) by mouth every 8 (eight) hours as needed. 20 tablet 0   promethazine  (PHENERGAN ) 25 MG suppository Place 1 suppository (25 mg total) rectally every 6 (six) hours as needed for nausea or vomiting. 12 each 0   scopolamine  (TRANSDERM-SCOP) 1 MG/3DAYS Place 1 patch (1.5 mg total) onto the skin every 3 (three) days. 4 patch 0   fluticasone  (FLONASE ) 50 MCG/ACT nasal spray Place 2 sprays into both nostrils daily. 16 g 0   gentamicin  ointment (GARAMYCIN ) 0.1 % Apply to nose three times daily 30 g 11   predniSONE  (DELTASONE ) 10 MG tablet Take 6 tablets by mouth on day 1, then take 5 tablets on day 2, then take 4 tablets on day 3, then take 3 tablets  on day 4, then take 2 tablets on day 5, then finish with 1 tablet on day 6. 21 tablet 0   sulfamethoxazole -trimethoprim  (BACTRIM  DS) 800-160 MG tablet Take 1 tablet by mouth 2 (two) times daily. 14 tablet 0   No facility-administered medications prior to visit.    Allergies  Allergen Reactions   Cefdinir Shortness Of Breath   Almond Meal (Obsolete) Itching   Cherry Itching   Peach [Prunus Persica] Itching    ROS Review  of Systems Negative unless indicated in HPI.    Objective:    Physical Exam Constitutional:      Appearance: Normal appearance.  Cardiovascular:     Rate and Rhythm: Normal rate and regular rhythm.     Pulses: Normal pulses.     Heart sounds: Normal heart sounds.  Abdominal:     Tenderness: There is no right CVA tenderness or left CVA tenderness.  Musculoskeletal:        General: Tenderness (multiple joint tenderness) present.     Cervical back: Normal range of motion.  Neurological:     General: No focal deficit present.     Mental Status: She is alert. Mental status is at baseline.  Psychiatric:        Mood and Affect: Mood normal.        Behavior: Behavior normal.        Thought Content: Thought content normal.        Judgment: Judgment normal.     BP 116/74   Pulse 70   Temp 97.7 F (36.5 C)   Ht 5' 6 (1.676 m)   Wt 159 lb 3.2 oz (72.2 kg)   SpO2 97%   BMI 25.70 kg/m  Wt Readings from Last 3 Encounters:  01/19/24 159 lb 3.2 oz (72.2 kg)  07/01/23 152 lb 3.2 oz (69 kg)  03/08/23 158 lb 3.2 oz (71.8 kg)     Health Maintenance  Topic Date Due   HIV Screening  Never done   Hepatitis C Screening  Never done   DTaP/Tdap/Td (1 - Tdap) Never done   Hepatitis B Vaccines 19-59 Average Risk (1 of 3 - 19+ 3-dose series) Never done   Colonoscopy  Never done   COVID-19 Vaccine (3 - Pfizer risk series) 02/03/2024 (Originally 01/24/2020)   Influenza Vaccine  07/17/2024 (Originally 11/18/2023)   Cervical Cancer Screening (HPV/Pap Cotest)   05/27/2025   Mammogram  10/02/2025   Pneumococcal Vaccine  Aged Out   HPV VACCINES  Aged Out   Meningococcal B Vaccine  Aged Out       Topic Date Due   Hepatitis B Vaccines 19-59 Average Risk (1 of 3 - 19+ 3-dose series) Never done    Lab Results  Component Value Date   TSH 0.90 07/01/2023   Lab Results  Component Value Date   WBC 4.4 01/19/2024   HGB 13.4 01/19/2024   HCT 39.1 01/19/2024   MCV 95.1 01/19/2024   PLT 228.0 01/19/2024   Lab Results  Component Value Date   NA 143 07/01/2023   K 4.1 07/01/2023   CO2 24 07/01/2023   GLUCOSE 85 07/01/2023   BUN 15 07/01/2023   CREATININE 1.08 (H) 07/01/2023   BILITOT 0.3 07/01/2023   ALKPHOS 44 09/22/2022   AST 15 07/01/2023   ALT 12 07/01/2023   PROT 6.1 07/01/2023   ALBUMIN 4.4 09/22/2022   CALCIUM 9.4 07/01/2023   ANIONGAP 8 08/14/2021   EGFR 72 02/26/2022   GFR 67.98 09/22/2022   Lab Results  Component Value Date   CHOL 210 (H) 09/22/2022   Lab Results  Component Value Date   HDL 71.00 09/22/2022   Lab Results  Component Value Date   LDLCALC 123 (H) 09/22/2022   Lab Results  Component Value Date   TRIG 80.0 09/22/2022   Lab Results  Component Value Date   CHOLHDL 3 09/22/2022   Lab Results  Component Value Date   HGBA1C 5.1 06/03/2020  Assessment & Plan:  Polyarthralgia Assessment & Plan: Chronic joint pain and swelling in hands, toes, and back, worse in the morning for 4-5 months. Family history of arthritis. Differential includes rheumatoid arthritis, vitamin B12 deficiency, and anemia. - Will obtains labs as outlined. - Recommend Tylenol  for pain. - Suggest massage therapy. - Advise warm packs and baths. - Recommend calcium and vitamin D supplementation.   Orders: -     VITAMIN D 25 Hydroxy (Vit-D Deficiency, Fractures) -     Vitamin B12 -     CBC -     ANA -     Anti-nuclear ab-titer (ANA titer)    Follow-up: No follow-ups on file.   Colby Catanese, NP

## 2024-01-20 ENCOUNTER — Other Ambulatory Visit: Payer: Self-pay

## 2024-01-20 LAB — CBC
HCT: 39.1 % (ref 36.0–46.0)
Hemoglobin: 13.4 g/dL (ref 12.0–15.0)
MCHC: 34.3 g/dL (ref 30.0–36.0)
MCV: 95.1 fl (ref 78.0–100.0)
Platelets: 228 K/uL (ref 150.0–400.0)
RBC: 4.11 Mil/uL (ref 3.87–5.11)
RDW: 12.5 % (ref 11.5–15.5)
WBC: 4.4 K/uL (ref 4.0–10.5)

## 2024-01-20 LAB — VITAMIN B12: Vitamin B-12: 272 pg/mL (ref 211–911)

## 2024-01-20 LAB — VITAMIN D 25 HYDROXY (VIT D DEFICIENCY, FRACTURES): VITD: 19.91 ng/mL — ABNORMAL LOW (ref 30.00–100.00)

## 2024-01-22 LAB — ANTI-NUCLEAR AB-TITER (ANA TITER): ANA Titer 1: 1:40 {titer} — ABNORMAL HIGH

## 2024-01-22 LAB — ANA: Anti Nuclear Antibody (ANA): POSITIVE — AB

## 2024-01-23 ENCOUNTER — Encounter: Payer: Self-pay | Admitting: Nurse Practitioner

## 2024-01-24 NOTE — Telephone Encounter (Signed)
 Copied from CRM #[id number]. Topic: Clinical - Lab/Test Results >> Jan 24, 2024 10:58 AM Thersia BROCKS wrote: Reason for CRM: Patient called in regarding lab results, stated she saw some abnormal results in her chart, and would like for a nurse to give her a callback regarding them

## 2024-01-25 ENCOUNTER — Ambulatory Visit: Payer: Self-pay | Admitting: Nurse Practitioner

## 2024-01-30 ENCOUNTER — Telehealth: Payer: Self-pay | Admitting: Internal Medicine

## 2024-01-30 DIAGNOSIS — M2550 Pain in unspecified joint: Secondary | ICD-10-CM

## 2024-01-30 NOTE — Telephone Encounter (Signed)
 Copied from CRM [id number]. Topic: Referral - Question >> Jan 30, 2024  3:11 PM Shanda MATSU wrote: Reason for CRM: Patient calling to check status of rheumatology referral that was supposed to have been sent, req a call back with status. Patient stated that she reached out to specialist's office and was told referral has not been recvd, patient is also req that referral be sent to [facility] location as well.

## 2024-01-30 NOTE — Assessment & Plan Note (Signed)
 Chronic joint pain and swelling in hands, toes, and back, worse in the morning for 4-5 months. Family history of arthritis. Differential includes rheumatoid arthritis, vitamin B12 deficiency, and anemia. - Will obtains labs as outlined. - Recommend Tylenol  for pain. - Suggest massage therapy. - Advise warm packs and baths. - Recommend calcium and vitamin D supplementation.

## 2024-02-01 NOTE — Telephone Encounter (Signed)
 Pt would also like to have a referral sent to a rheumatology office in [place]

## 2024-02-01 NOTE — Addendum Note (Signed)
 Addended by: MARYLYNN VERNEITA CROME on: 02/01/2024 01:02 PM   Modules accepted: Orders

## 2024-02-09 ENCOUNTER — Other Ambulatory Visit: Payer: Self-pay

## 2024-02-09 ENCOUNTER — Emergency Department
Admission: EM | Admit: 2024-02-09 | Discharge: 2024-02-09 | Disposition: A | Discharge status: Home / Self Care | Attending: Emergency Medicine | Admitting: Emergency Medicine

## 2024-02-09 ENCOUNTER — Emergency Department

## 2024-02-09 DIAGNOSIS — R079 Chest pain, unspecified: Secondary | ICD-10-CM | POA: Insufficient documentation

## 2024-02-09 DIAGNOSIS — R0789 Other chest pain: Secondary | ICD-10-CM | POA: Diagnosis not present

## 2024-02-09 DIAGNOSIS — R0602 Shortness of breath: Secondary | ICD-10-CM | POA: Diagnosis not present

## 2024-02-09 LAB — BASIC METABOLIC PANEL WITH GFR
Anion gap: 11 (ref 5–15)
BUN: 20 mg/dL (ref 6–20)
CO2: 24 mmol/L (ref 22–32)
Calcium: 9.9 mg/dL (ref 8.9–10.3)
Chloride: 108 mmol/L (ref 98–111)
Creatinine, Ser: 0.82 mg/dL (ref 0.44–1.00)
GFR, Estimated: >60 mL/min (ref >60)
Glucose, Bld: 86 mg/dL (ref 70–99)
Potassium: 3.9 mmol/L (ref 3.5–5.1)
Sodium: 143 mmol/L (ref 135–145)

## 2024-02-09 LAB — TROPONIN I (HIGH SENSITIVITY): Troponin I (High Sensitivity): 3 ng/L (ref <18)

## 2024-02-09 LAB — CBC
HCT: 39.1 % (ref 36.0–46.0)
Hemoglobin: 13 g/dL (ref 12.0–15.0)
MCH: 32.4 pg (ref 26.0–34.0)
MCHC: 33.2 g/dL (ref 30.0–36.0)
MCV: 97.5 fL (ref 80.0–100.0)
Platelets: 212 K/uL (ref 150–400)
RBC: 4.01 MIL/uL (ref 3.87–5.11)
RDW: 12 % (ref 11.5–15.5)
WBC: 4.7 K/uL (ref 4.0–10.5)
nRBC: 0 % (ref 0.0–0.2)

## 2024-02-09 NOTE — ED Triage Notes (Signed)
 Pt arrives via POV. Pt reports she had an episode of chest pain that lasted about 1 minute. Since his morning she has been experiencing sob and intermittent chest pain that radiates to her left arm. Pt arrives AxOx4. She reports a 4 hour car ride on Saturday. States she did get out once during the drive.

## 2024-02-09 NOTE — ED Provider Notes (Signed)
 Stephanie Chambers Provider Note    Event Date/Time   First MD Initiated Contact with Patient 02/09/24 1909     (approximate)   History   Chest Pain and Shortness of Breath   HPI  Stephanie Chambers is a 48 y.o. female with history of peptic ulcer disease, anxiety, presenting with chest pain.  States that she had an episode of chest pain that lasted 1 minute.  Had shortness of breath and intermittent chest pain that radiated to her arm.  States that she had a 4-hour car ride on Saturday but did get out to walk.  No prior cardiac issues.  Is not on any oral contraceptive pills, no history of DVT or malignancy.  No unilateral calf swelling or tenderness.  States that she is being referred to a rheumatologist for positive ANA.  Has been feeling more fatigued.  No cough or fever.  No other infectious symptoms.  Patient states that she is postmenopausal.  On independent chart review, she had an echo done in 2019 that showed normal EF.     Physical Exam   Triage Vital Signs: ED Triage Vitals  Encounter Vitals Group     BP 02/09/24 1726 122/71     Girls Systolic BP Percentile --      Girls Diastolic BP Percentile --      Boys Systolic BP Percentile --      Boys Diastolic BP Percentile --      Pulse Rate 02/09/24 1726 (!) 54     Resp 02/09/24 1726 16     Temp 02/09/24 1726 98.4 F (36.9 C)     Temp Source 02/09/24 1726 Oral     SpO2 02/09/24 1726 100 %     Weight 02/09/24 1726 153 lb (69.4 kg)     Height 02/09/24 1726 5' 6 (1.676 m)     Head Circumference --      Peak Flow --      Pain Score 02/09/24 1734 3     Pain Loc --      Pain Education --      Exclude from Growth Chart --     Most recent vital signs: Vitals:   02/09/24 1726  BP: 122/71  Pulse: (!) 54  Resp: 16  Temp: 98.4 F (36.9 C)  SpO2: 100%     General: Awake, no distress.  CV:  Good peripheral perfusion.  Resp:  Normal effort.  No tachypnea or respiratory distress Abd:  No  distention.  Soft nontender Other:  No lower extremity edema, no unilateral calf swelling or tenderness   ED Results / Procedures / Treatments   Labs (all labs ordered are listed, but only abnormal results are displayed) Labs Reviewed  BASIC METABOLIC PANEL WITH GFR  CBC  TROPONIN I (HIGH SENSITIVITY)     EKG  EKG shows, sinus bradycardia, rate 55, normal QS, normal QTc, no obvious ischemic ST elevation, T wave version to V2, T wave flattening to aVL, T wave changes new compared to prior   RADIOLOGY On my independent interpretation, chest x-ray without obvious consolidation   PROCEDURES:  Critical Care performed: No  Procedures   MEDICATIONS ORDERED IN ED: Medications - No data to display   IMPRESSION / MDM / ASSESSMENT AND PLAN / ED COURSE  I reviewed the triage vital signs and the nursing notes.  Differential diagnosis includes, but is not limited to, angina, ACS, anxiety, musculoskeletal pain, strain, did consider PE but patient has no other risk factors for it, she is PERC 0.  Will get labs, EKG, troponin, chest x-ray.  Patient's presentation is most consistent with acute presentation with potential threat to life or bodily function.  Independent interpretation of labs and imaging below.  Labs are reassuring.  Discussed with patient about outpatient follow-up, will refer to cardiology she has not had a recent workup with them.  Discussed with her about follow-up with her rheumatologist in January.  Otherwise considered but no indication for inpatient admission at this time, she safe for outpatient management.  Will discharge with strict return precautions.  Shared decision making done with patient and she is agreeable with the plan.    Clinical Course as of 02/09/24 1921  Thu Feb 09, 2024  1917 DG Chest 2 View No acute cardiopulmonary abnormality.  [TT]    Clinical Course User Index [TT] Waymond, Lorelle Cummins, MD     FINAL CLINICAL  IMPRESSION(S) / ED DIAGNOSES   Final diagnoses:  Chest pain, unspecified type     Rx / DC Orders   ED Discharge Orders          Ordered    Ambulatory referral to Cardiology       Comments: If you have not heard from the Cardiology office within the next 72 hours please call [phone].   02/09/24 1920             Note:  This document was prepared using Dragon voice recognition software and may include unintentional dictation errors.    Waymond Lorelle Cummins, MD 02/09/24 [phone]

## 2024-02-09 NOTE — Discharge Instructions (Signed)
 I put in the referral for you for cardiology, please be sure to follow-up.

## 2024-02-15 ENCOUNTER — Ambulatory Visit

## 2024-02-15 ENCOUNTER — Encounter: Payer: Self-pay | Admitting: Internal Medicine

## 2024-02-15 ENCOUNTER — Ambulatory Visit: Attending: Internal Medicine | Admitting: Internal Medicine

## 2024-02-15 VITALS — BP 118/70 | HR 59 | Ht 66.0 in | Wt 162.6 lb

## 2024-02-15 DIAGNOSIS — R079 Chest pain, unspecified: Secondary | ICD-10-CM

## 2024-02-15 DIAGNOSIS — R002 Palpitations: Secondary | ICD-10-CM

## 2024-02-15 NOTE — Patient Instructions (Signed)
 Medication Instructions:  Your physician recommends that you continue on your current medications as directed. Please refer to the Current Medication list given to you today.    *If you need a refill on your cardiac medications before your next appointment, please call your pharmacy*  Lab Work: No labs ordered today    Testing/Procedures: ZIO AT Long term monitor-Live Telemetry  Your physician has requested you wear a ZIO patch monitor for 14 days.  This is a single patch monitor. [employer] supplies one patch monitor per enrollment.  Additional stickers are not available.  Please do not apply patch if you will be having a Nuclear Stress Test, Echocardiogram, Cardiac CT, MRI, or Chest Xray during the period you would be wearing the monitor.  The patch cannot be worn during these tests.  You cannot remove and re-apply the ZIO AT patch monitor.  Your ZIO patch monitor will be mailed 3 day USPS to your address on file. It may take 3-5 days to receive your monitor after you have been enrolled.  Once you have received your monitor, please review the enclosed instructions. Your monitor has already been registered assigning a specific monitor serial number to you.   Billing and Patient Assistance Program information  Meredeth has been supplied with any insurance information on record for billing.  [employer] offers a sliding scale Patient Assistance Program for patients without insurance, or whose insurance does not completely cover the cost of the ZIO patch monitor.  You must apply for the Patient Assistance Program to qualify for the discounted rate.  To apply, call [employer] at [phone], select option 4, select option 2 , ask to apply for the Patient Assistance Program, (you can request an interpreter if needed).  [employer] will ask your household income and how many people are in your household.  [employer] will quote your out-of-pocket cost based on this information. They will also be able to set up a 12 month  interest free payment plan if needed.  Applying the monitor   Shave hair from upper left chest.  Hold the abrader disc by orange tab. Rub the abrader in 40 strokes over left upper chest as indicated in your monitor instructions.  Clean area with 4 enclosed alcohol pads. Use all pads to ensure the area is cleaned thoroughly. Let dry.  Apply patch as indicated in monitor instructions. Patch will be placed under collarbone on left side of chest with arrow pointing upward.  Rub patch adhesive wings for 2 minutes. Remove the white label marked 1. Remove the white label marked 2. Rub patch adhesive wings for 2 additional minutes.  While looking in a mirror, press and release button in center of patch. A small green light will flash 3-4 times.  This will be your only indicator that the monitor has been turned on.   Do not shower for the first 24 hours.  You may shower after the first 24 hours.  Press the button if you feel a symptom.  You will hear a small click.  Record Date, Time and Symptom in the Patient Log.   Starting the Gateway  In your kit there is a audiological scientist box the size of a cellphone.  This is [employer]. It transmits all your recorded data to [facility].  This box must always stay within 10 feet of you.  Open the box and push the * button.  There will be a light that blinks orange and then green a few times.  When the light  stops blinking, the Gateway is connected to the ZIO patch. Call [employer] at [phone] to confirm your monitor is transmitting.  Returning your monitor  Remove your patch and place it inside the Gateway.  In the lower half of the Gateway box there is a white bag with prepaid postage on it.  Place Gateway in bag and seal.  Mail package back to [place] as soon as possible.  Your physician should have your final report approximately 7-14 days after you have mailed back your monitor. Call [facility] Customer Care at [phone] if you have questions  regarding your ZIO AT patch monitor.   Call them immediately if you see an orange light blinking on your monitor.  If your monitor falls off in less than 4 days, contact our Monitor department at [phone]. If your monitor becomes loose or falls off after 4 days call [employer] at [phone] for suggestions on securing your monitor.   Follow-Up: At [facility], you and your health needs are our priority.  As part of our continuing mission to provide you with exceptional heart care, our providers are all part of one team.  This team includes your primary Cardiologist (physician) and Advanced Practice Providers or APPs (Physician Assistants and Nurse Practitioners) who all work together to provide you with the care you need, when you need it.  Your next appointment:   6 week(s)  Provider:   You may see Lonni Hanson, MD or one of the following Advanced Practice Providers on your designated Care Team:   Lonni Meager, NP Lesley Maffucci, PA-C Bernardino Bring, PA-C Cadence [place], PA-C Tylene Lunch, NP Barnie Hila, NP

## 2024-02-15 NOTE — Progress Notes (Unsigned)
 Cardiology Office Note:  .   Date:  02/17/2024  ID:  BOBBI YOUNT, DOB 1975/07/26, MRN [id number] PCP: Marylynn Verneita CROME, MD  [facility] Providers Cardiologist:  New Click to update primary MD,subspecialty MD or APP then REFRESH:1}    History of Present Illness: .   ARDEL JAGGER is a 48 y.o. female with history of peptic ulcer disease and anxiety, who has been referred by Dr. Waymond for evaluation of chest pain.  She presented to the [facility] emergency department on 02/09/2024 following an episode of chest pain radiating to the left arm with associated shortness of breath.  Workup was negative, including normal high-sensitivity troponin I x 1.  EKG showed sinus bradycardia without significant abnormalities.  She was recently found to have an elevated ANA and has been referred to rheumatology for further workup.  This was reports that she first developed fullness in her chest last Wednesday that persisted through the night and into the next morning.  The discomfort radiated to the left arm and was accompanied by a dizzy/faint feeling.  Persisted even after she laid down.  She notes that she has been more fatigued over the last few months and has intermittent joint pain/swelling.  She is also concerned about recurrent episodes of shortness of breath during which it feels like she has to think about breathing deeply.  This sensation of shortness of breath was also present with her aforementioned chest pain.  She wonders if she could be having an arrhythmia such as atrial fibrillation.  She also had palpitations 6 or 7 years ago, with event monitor at that time showing single brief runs of NSVT and PSVT.  The pain initially had abated after her ED visit but recurred this past weekend.  Even now, she has vague discomfort in the chest.  The pain seems to come and go throughout the day, usually lasting 30 to 35 minutes at a time.  On Thursday, it seemed to be a little worse with activity, though  now it seems to occur more randomly.  She denies fevers and chills.  She has not had any edema. ROS: See HPI  Studies Reviewed: SABRA   EKG Interpretation Date/Time:  Wednesday February 15 2024 10:11:22 EDT Ventricular Rate:  59 PR Interval:  146 QRS Duration:  88 QT Interval:  400 QTC Calculation: 396 R Axis:   18  Text Interpretation: Sinus bradycardia Normal ECG When compared with ECG of 09-Feb-2024 17:24, No significant change was found Confirmed by Nataniel Gasper [phone]) on 02/17/2024 9:53:28 AM    Exercise stress echocardiogram (9//2019): Baseline echo shows LVEF of 55-60% with mild mitral regurgitation.  Stress echo showed appropriate augmentation of LVEF to 65-70% with no regional wall motion abnormalities.  Study was normal.  Event monitor (11/16/2017): The patient was monitored for 7 days.  Predominant rhythm was sinus with a 4 beat run of NSVT and a single supraventricular run lasting 5 beats.  Risk Assessment/Calculations:           Physical Exam:   VS:  BP 118/70   Pulse (!) 59   Ht 5' 6 (1.676 m)   Wt 162 lb 9.6 oz (73.8 kg)   SpO2 98%   BMI 26.24 kg/m    Wt Readings from Last 3 Encounters:  02/15/24 162 lb 9.6 oz (73.8 kg)  02/09/24 153 lb (69.4 kg)  01/19/24 159 lb 3.2 oz (72.2 kg)    General:  NAD. Neck: No JVD or HJR. Lungs: Clear  to auscultation bilaterally without wheezes or crackles. Heart: Regular rate and rhythm without murmurs, rubs, or gallops. Abdomen: Soft, nontender, nondistended. Extremities: No lower extremity edema.  ASSESSMENT AND PLAN: .    Atypical chest pain and palpitations: Chest pain began last week and has been present off and on since then.  ED workup was reassuring.  Physical exam and EKG today are also unremarkable.  Ms. Rudy has experienced some fatigue over the last few months and was also noted to have a borderline positive ANA.  I think it is reasonable to pursue autoimmune workup.  We discussed further evaluation options,  including noninvasive ischemia testing, formal resting echocardiogram, but have agreed to start with a 14-day event monitor, as Ms. Balla is concerned that her chest pain and dyspnea could be due to a transient arrhythmia.  We will defer medication changes at this time.    Dispo: Return to clinic in 6 weeks.  Signed, Lonni Hanson, MD

## 2024-02-17 ENCOUNTER — Encounter: Payer: Self-pay | Admitting: Internal Medicine

## 2024-02-17 DIAGNOSIS — R002 Palpitations: Secondary | ICD-10-CM | POA: Insufficient documentation

## 2024-03-20 ENCOUNTER — Other Ambulatory Visit: Payer: Self-pay | Admitting: Internal Medicine

## 2024-03-20 ENCOUNTER — Other Ambulatory Visit: Payer: Self-pay

## 2024-03-20 DIAGNOSIS — F419 Anxiety disorder, unspecified: Secondary | ICD-10-CM

## 2024-03-20 MED ORDER — BUPROPION HCL ER (SR) 150 MG PO TB12
150.0000 mg | ORAL_TABLET | Freq: Two times a day (BID) | ORAL | 3 refills | Status: AC
  Filled 2024-03-20: qty 180, 90d supply, fill #0

## 2024-03-20 MED ORDER — ESCITALOPRAM OXALATE 5 MG PO TABS
5.0000 mg | ORAL_TABLET | Freq: Every day | ORAL | 3 refills | Status: AC
  Filled 2024-03-20 – 2024-05-07 (×2): qty 90, 90d supply, fill #0

## 2024-03-27 ENCOUNTER — Other Ambulatory Visit: Payer: Self-pay

## 2024-03-28 ENCOUNTER — Ambulatory Visit: Admitting: Nurse Practitioner

## 2024-03-30 ENCOUNTER — Encounter: Payer: Self-pay | Admitting: Internal Medicine

## 2024-04-02 ENCOUNTER — Other Ambulatory Visit: Payer: Self-pay

## 2024-04-02 MED ORDER — SCOPOLAMINE 1 MG/3DAYS TD PT72
1.0000 | MEDICATED_PATCH | TRANSDERMAL | 1 refills | Status: AC
  Filled 2024-04-02 – 2024-04-16 (×2): qty 4, 12d supply, fill #0

## 2024-04-12 ENCOUNTER — Encounter: Payer: Self-pay | Admitting: Internal Medicine

## 2024-04-13 ENCOUNTER — Other Ambulatory Visit: Payer: Self-pay

## 2024-04-13 NOTE — Telephone Encounter (Signed)
 Sending to doc of day as fyi   Pt inquiring about other meds that can be used besides tylenol . Also advised pt that if having worsening pains to be seen

## 2024-04-15 MED ORDER — MELOXICAM 15 MG PO TABS
15.0000 mg | ORAL_TABLET | Freq: Every day | ORAL | 2 refills | Status: AC
  Filled 2024-04-15: qty 30, 30d supply, fill #0
  Filled 2024-05-15: qty 30, 30d supply, fill #1

## 2024-04-16 ENCOUNTER — Other Ambulatory Visit: Payer: Self-pay

## 2024-04-18 ENCOUNTER — Ambulatory Visit: Admitting: Internal Medicine

## 2024-05-02 NOTE — Progress Notes (Signed)
 "  Office Visit Note  Patient: Stephanie Chambers             Date of Birth: 06/16/1975           MRN: [id number]             PCP: Marylynn Verneita CROME, MD Referring: Marylynn Verneita CROME, MD Visit Date: 05/15/2024 Occupation:        CELL  Subjective:  New Patient (Initial Visit) (Positive ANA, Pain in hands, knees, heels, shoulders, toes, bottom of feet, hips and elbows. Patient states she is having swelling in her fingers and toes. )   History of Present Illness: Stephanie Chambers is a 49 y.o. female    Discussed the use of AI scribe software for clinical note transcription with the patient, who gave verbal consent to proceed.  History of Present Illness Stephanie Chambers is a 49 year old female who presents with worsening joint pain and swelling for rheumatology evaluation. She is accompanied by her husband.  She has been experiencing joint pain and swelling for the past two to three years, initially intermittent but now constant and worsening over the past five to six months. The pain and swelling have spread from her fingers to her knees, hips, shoulders, elbows, and toes. Her fingers exhibit specific symptoms: pinkies clicking and locking, ring fingers hurting, middle fingers swelling, and thumbs locking and popping. Her toes and the bottom of her feet are painful, making walking difficult without special footwear.  She has a history of plantar fasciitis, for which she received injections; the first was effective, but subsequent ones were not. Despite this, she continues to experience significant pain in her feet, particularly in the heels, requiring her to prop them up to alleviate discomfort.  Her symptoms are notably worse in the morning, with significant stiffness lasting about 45 minutes. She has been taking meloxicam  and Tylenol  Arthritis without relief. She has tried prednisone  in the past for a sinus infection but did not take it long enough to assess its impact on her joint symptoms due  to side effects.  No history of psoriasis, ulcerative colitis, or Crohn's disease. Her family history includes a maternal aunt with lupus and a daughter with dysautonomia. Her mother possibly had rheumatoid arthritis, as she had similar symptoms in her hands.  She started menopause around the age of 18, and since then, she has experienced increased fatigue, anxiety, and sleep disturbances. She works in administrator, civil service and reports that her symptoms have significantly impacted her ability to perform daily activities, including work and exercise.  No rashes, sores in the mouth (except for a recent sore that resolved), dry eyes, or sun sensitivity. She experiences some dry mouth and occasional swelling in her knees and feet. She denies raynaud's symptoms.    Activities of Daily Living:  Patient reports morning stiffness for 30 minutes.   Patient Reports nocturnal pain.  Difficulty dressing/grooming: Reports Difficulty climbing stairs: Reports Difficulty getting out of chair: Reports Difficulty using hands for taps, buttons, cutlery, and/or writing: Reports  Review of Systems  Constitutional:  Positive for fatigue.  HENT:  Positive for mouth sores. Negative for mouth dryness.   Eyes:  Negative for dryness.  Respiratory:  Negative for shortness of breath.   Cardiovascular:  Positive for palpitations. Negative for chest pain.  Gastrointestinal:  Negative for blood in stool, constipation and diarrhea.  Endocrine: Positive for increased urination.  Genitourinary:  Negative for involuntary urination.  Musculoskeletal:  Positive for  joint pain, gait problem, joint pain, joint swelling, myalgias, morning stiffness and myalgias. Negative for muscle weakness and muscle tenderness.  Skin:  Positive for hair loss. Negative for color change, rash and sensitivity to sunlight.  Allergic/Immunologic: Negative for susceptible to infections.  Neurological:  Negative for dizziness and  headaches.  Hematological:  Negative for swollen glands.  Psychiatric/Behavioral:  Positive for sleep disturbance. Negative for depressed mood. The patient is nervous/anxious.     PMFS History:  Patient Active Problem List   Diagnosis Date Noted   Palpitation 02/17/2024   Sinus congestion 01/19/2023   History of motion sickness 09/10/2022   Swelling of knee joint 02/17/2022   Dysuria 02/14/2022   Right ovarian cyst 09/19/2021   Encounter for preventive health examination 05/14/2021   Depression    Chest pain 06/06/2020   Anxiety 05/29/2018   Peptic ulcer disease 05/29/2018   Polyarthralgia 04/27/2017   Hair loss 04/27/2017   Family history of colon cancer in father 04/27/2017   Family history of breast cancer in mother 02/12/2016   Cephalalgia 10/14/2015   PMS (premenstrual syndrome) 10/14/2015   History of anemia 04/19/2013    Past Medical History:  Diagnosis Date   Acute cystitis without hematuria 07/12/2022   Anemia    Anxiety    Bulging of cervical intervertebral disc    Constipation    Depression    major   DUB (dysfunctional uterine bleeding)    Endometrial polyp 05/2012   hysteroscopy w/ d &C   Epigastric abdominal pain    Lump in female breast    Mastodynia    Menorrhagia    PONV (postoperative nausea and vomiting)    UTI (lower urinary tract infection)     Family History  Problem Relation Age of Onset   Cancer Mother 60       breast with liver mets   Breast cancer Mother 96   Atrial fibrillation Mother    Diabetes Father    Cancer Father 80       colon, lung, kidney   Heart disease Father    Colon cancer Father    Coronary artery disease Father 35       CABG   Arthritis Brother    Hypertension Brother    Arthritis Brother    Depression Maternal Grandmother    Cancer Maternal Grandmother        colon   Colon cancer Maternal Grandmother    Diabetes Paternal Grandmother    Heart disease Paternal Grandmother    Depression Paternal Grandmother     ALS Paternal Grandfather    Breast cancer Cousin 69   Other Daughter        Dysautonomia   Ovarian cancer Neg Hx    Past Surgical History:  Procedure Laterality Date   BREAST BIOPSY Right 04/24/2018   Affirm Bx- X-clip,  FOCAL SCLEROSING ADENOSIS WITH ASSOCIATED CALCIFICATIONS   BREAST EXCISIONAL BIOPSY Right 10/11/2018   MILD FIBROCYSTIC CHANGES   DIAGNOSTIC LAPAROSCOPY  2004   DILATION AND CURETTAGE OF UTERUS     HYSTEROSCOPY     NEVUS EXCISION Left 10/12/2018   Procedure: NEVUS EXCISION X TWO NECK AND CHEST;  Surgeon: Ebbie Cough, MD;  Location: [facility];  Service: General;  Laterality: Left;   RADIOACTIVE SEED GUIDED EXCISIONAL BREAST BIOPSY Right 10/12/2018   Procedure: RADIOACTIVE SEED GUIDED EXCISIONAL RIGHT BREAST BIOPSY AND REMOVAL OF MOLE RIGHT BREAST;  Surgeon: Ebbie Cough, MD;  Location: [facility];  Service: General;  Laterality: Right;   TONSILLECTOMY  1998   Social History[1] Social History   Social History Narrative   Not on file     Immunization History  Administered Date(s) Administered   Influenza-Unspecified 01/17/2021   PFIZER(Purple Top)SARS-COV-2 Vaccination 12/06/2019, 12/27/2019     Objective: Vital Signs: BP 106/63 (BP Location: Right Arm, Patient Position: Sitting, Cuff Size: Small)   Pulse 62   Temp 98.3 F (36.8 C)   Resp 12   Ht 5' 7 (1.702 m)   Wt 162 lb 12.8 oz (73.8 kg)   LMP 10/15/2021   BMI 25.50 kg/m    Physical Exam Vitals and nursing note reviewed.  HENT:     Head: Normocephalic and atraumatic.     Nose: Nose normal.  Eyes:     Conjunctiva/sclera: Conjunctivae normal.     Pupils: Pupils are equal, round, and reactive to light.  Pulmonary:     Effort: Pulmonary effort is normal. No respiratory distress.  Musculoskeletal:     Comments: Heberden's and Bouchard's nodes bilaterally  Skin:    General: Skin is warm and dry.  Neurological:     Mental Status: She is alert. Mental  status is at baseline.  Psychiatric:        Mood and Affect: Mood normal.        Behavior: Behavior normal.      Musculoskeletal Exam:   CDAI Exam: CDAI Score: -- Patient Global: --; Provider Global: -- Swollen: --; Tender: -- Joint Exam 05/15/2024   No joint exam has been documented for this visit   There is currently no information documented on the homunculus. Go to the Rheumatology activity and complete the homunculus joint exam.  Investigation: No additional findings.  Imaging: No results found.  Recent Labs: Lab Results  Component Value Date   WBC 4.7 02/09/2024   HGB 13.0 02/09/2024   PLT 212 02/09/2024   NA 143 02/09/2024   K 3.9 02/09/2024   CL 108 02/09/2024   CO2 24 02/09/2024   GLUCOSE 86 02/09/2024   BUN 20 02/09/2024   CREATININE 0.82 02/09/2024   BILITOT 0.3 07/01/2023   ALKPHOS 44 09/22/2022   AST 15 07/01/2023   ALT 12 07/01/2023   PROT 6.1 07/01/2023   ALBUMIN 4.4 09/22/2022   CALCIUM 9.9 02/09/2024   GFRAA 85 11/16/2017    Speciality Comments: No specialty comments available.  Procedures:  No procedures performed Allergies: Cefdinir, Almond meal (obsolete), Cherry, and Peach [prunus persica]   Assessment / Plan:     Visit Diagnoses:   Polyarthralgia Osteoarthritis Patient with hx of tendonitis, bursitis, dactylitis (?), and polyarthralgia. At this time, suspect joint pain is multifactorial in setting of non-inflammatory arthritis (evidence of OA on exam) and possible inflammatory arthritis. Given presentation, concerned for spondyloarthropathy.   Plan: DG Hand 2 View Right, DG Hand 2 View Left, DG Foot 2 Views Left, DG Foot 2 Views Right, Angiotensin converting enzyme, C-reactive protein, Sedimentation rate, Rheumatoid Factor, Cyclic citrul peptide antibody, IgG, DG HIPS BILAT WITH PELVIS 3-4 VIEWS  Patient failed 3 NSAIDS, will trial steroid taper to see if this would provide any improvement. As discussed with patient, if steroid  taper fails to provide any relief, unlikely to achieve much clinical benefit from additional DMARD therapy. However, will await work-up documented above.  High risk medication use  Plan: CBC, ALT, AST, Creatinine, serum Prednisone  taper provided. Discussed steroid side effects including weight gain, decreased bone density/fractures, risk for infection, avascular necrosis, hypertension, hyperglycemia, dyspepsia/gastric ulcers,  fluid retention, weakness, bruising, acne, cataracts, insomnia, mood problems. Patient also instructed to take this medication with food, and not to take this medication with NSAIDs such as Aleve or Ibuprofen .    Positive ANA (antinuclear antibody)  Patient with mildly positive ANA (1:40) and mild dry mouth. Patient does not have any features of SLE (no objective malar rash, Raynaud's, alopecia, recurrent cytopenias) or scleroderma (no sclerodactyly, no Raynaud's).  As discussed with patient, a positive ANA need not represent presence of clinically active systemic autoimmune disease.  Can be positive in the normal population, autoimmune thyroid  disease (Graves' disease, Hashimoto's thyroiditis etc.), or infection. ANA can be positive in the healthy population at the following rates: ANA 1:40: 20%-30%, ANA 1:80: 10%-15%, ANA 1:160: 5%, ANA 1:320: 3% positive, healthy relative of an SLE patient: 5%-25% positive (usually low titers), and elderly (age >70 years): up to 70% positive at ANA titer 1:40.   Given dry mouth, will obtain Sjogren's syndrome antibods(ssa + ssb), Protein / creatinine ratio, urine.  Orders: Orders Placed This Encounter  Procedures   DG Hand 2 View Right   DG Hand 2 View Left   DG Foot 2 Views Left   DG Foot 2 Views Right   DG HIPS BILAT WITH PELVIS 3-4 VIEWS   Sjogren's syndrome antibods(ssa + ssb)   Angiotensin converting enzyme   C-reactive protein   Sedimentation rate   Rheumatoid Factor   Cyclic citrul peptide antibody, IgG   CBC   ALT   AST    Creatinine, serum   Hepatitis B core antibody, IgM   Hepatitis B surface antigen   Hepatitis C antibody   Protein / creatinine ratio, urine   Meds ordered this encounter  Medications   predniSONE  (DELTASONE ) 5 MG tablet    Sig: Take 4 tablets (20 mg total) by mouth daily with breakfast for 3 days, THEN 3 tablets (15 mg total) daily with breakfast for 3 days, THEN 2 tablets (10 mg total) daily with breakfast for 3 days, THEN 1 tablet (5 mg total) daily with breakfast for 3 days.    Dispense:  30 tablet    Refill:  0   I personally spent a total of 60 minutes in the care of the patient today including preparing to see the patient, getting/reviewing separately obtained history, performing a medically appropriate exam/evaluation, counseling and educating, referring and communicating with other health care professionals, documenting clinical information in the EHR, and independently interpreting results.   Follow-Up Instructions: Return in about 4 weeks (around 06/12/2024).   Asberry Claw, DO  Note - This record has been created using Animal nutritionist.  Chart creation errors have been sought, but may not always  have been located. Such creation errors do not reflect on  the standard of medical care.     [1]  Social History Tobacco Use   Smoking status: Former    Current packs/day: 0.00    Average packs/day: 0.5 packs/day for 5.0 years (2.5 ttl pk-yrs)    Types: Cigarettes    Start date: 73    Quit date: 2002    Years since quitting: 24.0    Passive exposure: Never   Smokeless tobacco: Never  Vaping Use   Vaping status: Never Used  Substance Use Topics   Alcohol use: Yes    Alcohol/week: 0.0 standard drinks of alcohol    Comment: occassional   Drug use: No   "

## 2024-05-07 ENCOUNTER — Other Ambulatory Visit: Payer: Self-pay

## 2024-05-09 ENCOUNTER — Other Ambulatory Visit: Payer: Self-pay

## 2024-05-15 ENCOUNTER — Ambulatory Visit

## 2024-05-15 ENCOUNTER — Other Ambulatory Visit: Payer: Self-pay

## 2024-05-15 ENCOUNTER — Ambulatory Visit: Admission: RE | Admit: 2024-05-15 | Discharge: 2024-05-15 | Disposition: A | Source: Ambulatory Visit

## 2024-05-15 VITALS — BP 106/63 | HR 62 | Temp 98.3°F | Resp 12 | Ht 67.0 in | Wt 162.8 lb

## 2024-05-15 DIAGNOSIS — M1990 Unspecified osteoarthritis, unspecified site: Secondary | ICD-10-CM

## 2024-05-15 DIAGNOSIS — Z1159 Encounter for screening for other viral diseases: Secondary | ICD-10-CM | POA: Diagnosis not present

## 2024-05-15 DIAGNOSIS — Z79899 Other long term (current) drug therapy: Secondary | ICD-10-CM | POA: Diagnosis not present

## 2024-05-15 DIAGNOSIS — M2550 Pain in unspecified joint: Secondary | ICD-10-CM

## 2024-05-15 DIAGNOSIS — R7689 Other specified abnormal immunological findings in serum: Secondary | ICD-10-CM | POA: Diagnosis not present

## 2024-05-15 MED ORDER — PREDNISONE 5 MG PO TABS
ORAL_TABLET | ORAL | 0 refills | Status: AC
  Filled 2024-05-15: qty 30, 12d supply, fill #0

## 2024-05-16 LAB — CBC
HCT: 39.2 % (ref 35.9–46.0)
Hemoglobin: 13.1 g/dL (ref 11.7–15.5)
MCH: 32 pg (ref 27.0–33.0)
MCHC: 33.4 g/dL (ref 31.6–35.4)
MCV: 95.8 fL (ref 81.4–101.7)
MPV: 10.2 fL (ref 7.5–12.5)
Platelets: 213 10*3/uL (ref 140–400)
RBC: 4.09 Million/uL (ref 3.80–5.10)
RDW: 11.7 % (ref 11.0–15.0)
WBC: 5.5 10*3/uL (ref 3.8–10.8)

## 2024-05-16 LAB — HEPATITIS B SURFACE ANTIGEN: Hepatitis B Surface Ag: NONREACTIVE

## 2024-05-16 LAB — C-REACTIVE PROTEIN: CRP: <3 mg/L

## 2024-05-16 LAB — AST: AST: 15 U/L (ref 10–35)

## 2024-05-16 LAB — CYCLIC CITRUL PEPTIDE ANTIBODY, IGG: Cyclic Citrullin Peptide Ab: <16 U

## 2024-05-16 LAB — ALT: ALT: 14 U/L (ref 6–29)

## 2024-05-16 LAB — PROTEIN / CREATININE RATIO, URINE
Creatinine, Urine: 14 mg/dL — ABNORMAL LOW (ref 20–275)
Total Protein, Urine: <4 mg/dL — ABNORMAL LOW (ref 5–24)

## 2024-05-16 LAB — HEPATITIS C ANTIBODY: Hepatitis C Ab: NONREACTIVE

## 2024-05-16 LAB — HEPATITIS B CORE ANTIBODY, IGM: Hep B C IgM: NONREACTIVE

## 2024-05-16 LAB — SJOGREN'S SYNDROME ANTIBODS(SSA + SSB)
SSA (Ro) (ENA) Antibody, IgG: <1 AI
SSB (La) (ENA) Antibody, IgG: <1 AI

## 2024-05-16 LAB — RHEUMATOID FACTOR: Rheumatoid fact SerPl-aCnc: <10 [IU]/mL

## 2024-05-16 LAB — CREATININE, SERUM: Creat: 0.84 mg/dL (ref 0.50–0.99)

## 2024-05-16 LAB — SEDIMENTATION RATE: Sed Rate: 6 mm/h (ref 0–20)

## 2024-05-16 LAB — ANGIOTENSIN CONVERTING ENZYME: Angiotensin-Converting Enzyme: 34 U/L (ref 9–67)

## 2024-05-17 ENCOUNTER — Other Ambulatory Visit: Payer: Self-pay

## 2024-06-12 ENCOUNTER — Ambulatory Visit

## 2024-07-03 ENCOUNTER — Encounter: Admitting: Internal Medicine

## 2024-07-04 ENCOUNTER — Encounter: Admitting: Internal Medicine
